# Patient Record
Sex: Male | Born: 1945 | Race: White | Hispanic: No | Marital: Married | Smoking: Current some day smoker
Health system: Southern US, Community
[De-identification: ages and names within clinical notes are randomized; demographics above are authoritative.]

## PROBLEM LIST (undated history)

## (undated) DIAGNOSIS — R310 Gross hematuria: Secondary | ICD-10-CM

## (undated) DIAGNOSIS — G4700 Insomnia, unspecified: Secondary | ICD-10-CM

## (undated) DIAGNOSIS — Z973 Presence of spectacles and contact lenses: Secondary | ICD-10-CM

## (undated) DIAGNOSIS — F909 Attention-deficit hyperactivity disorder, unspecified type: Secondary | ICD-10-CM

## (undated) DIAGNOSIS — M48062 Spinal stenosis, lumbar region with neurogenic claudication: Secondary | ICD-10-CM

## (undated) DIAGNOSIS — N3281 Overactive bladder: Secondary | ICD-10-CM

## (undated) DIAGNOSIS — R030 Elevated blood-pressure reading, without diagnosis of hypertension: Secondary | ICD-10-CM

## (undated) DIAGNOSIS — H34231 Retinal artery branch occlusion, right eye: Secondary | ICD-10-CM

## (undated) DIAGNOSIS — I350 Nonrheumatic aortic (valve) stenosis: Secondary | ICD-10-CM

## (undated) DIAGNOSIS — Z86010 Personal history of colonic polyps: Secondary | ICD-10-CM

## (undated) DIAGNOSIS — G450 Vertebro-basilar artery syndrome: Secondary | ICD-10-CM

## (undated) DIAGNOSIS — R7301 Impaired fasting glucose: Secondary | ICD-10-CM

## (undated) DIAGNOSIS — M159 Polyosteoarthritis, unspecified: Secondary | ICD-10-CM

## (undated) DIAGNOSIS — Z87898 Personal history of other specified conditions: Secondary | ICD-10-CM

## (undated) DIAGNOSIS — N529 Male erectile dysfunction, unspecified: Secondary | ICD-10-CM

## (undated) DIAGNOSIS — I10 Essential (primary) hypertension: Secondary | ICD-10-CM

## (undated) DIAGNOSIS — F32A Depression, unspecified: Secondary | ICD-10-CM

## (undated) DIAGNOSIS — C61 Malignant neoplasm of prostate: Secondary | ICD-10-CM

## (undated) DIAGNOSIS — Z9189 Other specified personal risk factors, not elsewhere classified: Secondary | ICD-10-CM

## (undated) DIAGNOSIS — R011 Cardiac murmur, unspecified: Secondary | ICD-10-CM

## (undated) DIAGNOSIS — E785 Hyperlipidemia, unspecified: Secondary | ICD-10-CM

## (undated) DIAGNOSIS — H8110 Benign paroxysmal vertigo, unspecified ear: Secondary | ICD-10-CM

## (undated) DIAGNOSIS — IMO0001 Reserved for inherently not codable concepts without codable children: Secondary | ICD-10-CM

## (undated) DIAGNOSIS — F419 Anxiety disorder, unspecified: Secondary | ICD-10-CM

## (undated) DIAGNOSIS — D696 Thrombocytopenia, unspecified: Secondary | ICD-10-CM

## (undated) DIAGNOSIS — M961 Postlaminectomy syndrome, not elsewhere classified: Secondary | ICD-10-CM

## (undated) DIAGNOSIS — R05 Cough: Secondary | ICD-10-CM

## (undated) DIAGNOSIS — R399 Unspecified symptoms and signs involving the genitourinary system: Secondary | ICD-10-CM

## (undated) DIAGNOSIS — F329 Major depressive disorder, single episode, unspecified: Secondary | ICD-10-CM

## (undated) DIAGNOSIS — Z87820 Personal history of traumatic brain injury: Secondary | ICD-10-CM

## (undated) DIAGNOSIS — N200 Calculus of kidney: Secondary | ICD-10-CM

## (undated) DIAGNOSIS — G8929 Other chronic pain: Secondary | ICD-10-CM

## (undated) DIAGNOSIS — E878 Other disorders of electrolyte and fluid balance, not elsewhere classified: Secondary | ICD-10-CM

## (undated) DIAGNOSIS — G2581 Restless legs syndrome: Secondary | ICD-10-CM

## (undated) HISTORY — DX: Postlaminectomy syndrome, not elsewhere classified: M96.1

## (undated) HISTORY — DX: Nonrheumatic aortic (valve) stenosis: I35.0

## (undated) HISTORY — DX: Reserved for inherently not codable concepts without codable children: IMO0001

## (undated) HISTORY — DX: Malignant neoplasm of prostate: C61

## (undated) HISTORY — DX: Impaired fasting glucose: R73.01

## (undated) HISTORY — DX: Gross hematuria: R31.0

## (undated) HISTORY — DX: Other chronic pain: G89.29

## (undated) HISTORY — DX: Hyperlipidemia, unspecified: E78.5

## (undated) HISTORY — DX: Other disorders of electrolyte and fluid balance, not elsewhere classified: E87.8

## (undated) HISTORY — DX: Male erectile dysfunction, unspecified: N52.9

## (undated) HISTORY — DX: Calculus of kidney: N20.0

## (undated) HISTORY — DX: Spinal stenosis, lumbar region with neurogenic claudication: M48.062

## (undated) HISTORY — PX: LUMBAR FUSION: SHX111

## (undated) HISTORY — DX: Restless legs syndrome: G25.81

## (undated) HISTORY — DX: Retinal artery branch occlusion, right eye: H34.231

## (undated) HISTORY — DX: Elevated blood-pressure reading, without diagnosis of hypertension: R03.0

## (undated) HISTORY — DX: Major depressive disorder, single episode, unspecified: F32.9

## (undated) HISTORY — DX: Personal history of colonic polyps: Z86.010

## (undated) HISTORY — PX: CERVICAL FUSION: SHX112

## (undated) HISTORY — DX: Benign paroxysmal vertigo, unspecified ear: H81.10

## (undated) HISTORY — DX: Overactive bladder: N32.81

## (undated) HISTORY — DX: Personal history of other specified conditions: Z87.898

## (undated) HISTORY — DX: Anxiety disorder, unspecified: F41.9

## (undated) HISTORY — PX: COLONOSCOPY: SHX174

## (undated) HISTORY — PX: SPINAL CORD STIMULATOR IMPLANT: SHX2422

## (undated) HISTORY — DX: Depression, unspecified: F32.A

## (undated) HISTORY — DX: Attention-deficit hyperactivity disorder, unspecified type: F90.9

## (undated) HISTORY — DX: Insomnia, unspecified: G47.00

## (undated) HISTORY — DX: Thrombocytopenia, unspecified: D69.6

## (undated) HISTORY — DX: Cough: R05

## (undated) HISTORY — DX: Essential (primary) hypertension: I10

## (undated) HISTORY — DX: Polyosteoarthritis, unspecified: M15.9

---

## 2000-06-15 ENCOUNTER — Encounter: Payer: Self-pay | Admitting: Emergency Medicine

## 2000-06-15 ENCOUNTER — Emergency Department (HOSPITAL_COMMUNITY): Admission: EM | Admit: 2000-06-15 | Discharge: 2000-06-15 | Payer: Self-pay | Admitting: *Deleted

## 2000-06-18 ENCOUNTER — Encounter: Admission: RE | Admit: 2000-06-18 | Discharge: 2000-06-18 | Payer: Self-pay | Admitting: Urology

## 2000-06-18 ENCOUNTER — Encounter: Payer: Self-pay | Admitting: Urology

## 2005-12-31 ENCOUNTER — Ambulatory Visit (HOSPITAL_BASED_OUTPATIENT_CLINIC_OR_DEPARTMENT_OTHER): Admission: RE | Admit: 2005-12-31 | Discharge: 2005-12-31 | Payer: Self-pay | Admitting: General Surgery

## 2005-12-31 ENCOUNTER — Encounter (INDEPENDENT_AMBULATORY_CARE_PROVIDER_SITE_OTHER): Payer: Self-pay | Admitting: Specialist

## 2005-12-31 HISTORY — PX: OTHER SURGICAL HISTORY: SHX169

## 2007-06-12 ENCOUNTER — Encounter: Admission: RE | Admit: 2007-06-12 | Discharge: 2007-06-12 | Payer: Self-pay | Admitting: Anesthesiology

## 2007-07-23 ENCOUNTER — Encounter: Admission: RE | Admit: 2007-07-23 | Discharge: 2007-07-23 | Payer: Self-pay | Admitting: Anesthesiology

## 2009-08-01 ENCOUNTER — Encounter: Admission: RE | Admit: 2009-08-01 | Discharge: 2009-08-01 | Payer: Self-pay | Admitting: Family Medicine

## 2010-08-01 NOTE — Op Note (Signed)
NAMEBYRNE, CAPEK                 ACCOUNT NO.:  [id number]   MEDICAL RECORD NO.:  [id number]          PATIENT TYPE:  AMB   LOCATION:  DSC                          FACILITY:  [facility]   PHYSICIAN:  Angelia Mould. Derrell Lolling, M.D.DATE OF BIRTH:  24-Jul-1945   DATE OF PROCEDURE:  12/31/2005  DATE OF DISCHARGE:                                 OPERATIVE REPORT   PREOPERATIVE DIAGNOSIS:  Muscle pain, myositis   POSTOPERATIVE DIAGNOSIS:  Muscle pain, myositis   OPERATION PERFORMED:  Left thigh quadriceps muscle biopsy x3.   SURGEON:  Angelia Mould. Derrell Lolling, M.D.   OPERATIVE INDICATIONS:  This is a 65 year old white man who has developed  progressive disabling muscle pain.  He has pain in his arms and his legs.  This was thought to be fibromyalgia, but his blood work has shown  substantially progressive elevations of his CPK.  Dr. Chase Picket as  evaluated him and thinks a muscle biopsy is the next step in this  evaluation.  He has been counseled as an outpatient.  He is brought to the  operating room electively.   OPERATIVE TECHNIQUE:  Following the induction of a general LMA anesthetic,  the patient's left thigh was prepped and draped in a sterile fashion.  The  patient was identified as to correct procedure, correct site, correct  patient.  0.5% Marcaine with epinephrine was used as a local infiltration  anesthetic.  A slightly oblique longitudinally oriented incision was made in  the left thigh anterolaterally.  Dissection was carried down through the  subcutaneous tissue.  The deep muscle fascia was incised.  Self-retaining  retractors were placed.  I isolated three separate slips of left quadriceps  muscle.  Each of these was about to 2.5 cm in length x about 5-10 mm in  diameter.  They were clamped proximally and distally after isolation,  removed and then fixed to a tongue blade was skin staples.  These were  wrapped in saline.  The cut ends of the muscle were tied off with 2-0 Vicryl  ties.  I  sent three separate slips of muscle.  Hemostasis was excellent.  The wound was irrigated with saline.  The deep muscle fascia was closed with  a running suture of 2-0 Vicryl.  Subcutaneous tissue was closed with  interrupted sutures of 3-0 Vicryl.  Skin closed with a running subcuticular  suture of 4-0 Monocryl and Steri-Strips.  Clean bandages were placed and the  patient taken to the recovery room in stable condition.  Estimated blood  loss was about 15 mL.  Complications none.  Sponge, needle and instrument  counts were correct.      Angelia Mould. Derrell Lolling, M.D.  Electronically Signed     HMI/MEDQ  D:  12/31/2005  T:  01/01/2006  Job:  [id number]   cc:   Chase Picket, Dr.  Marjory Lies, M.D.

## 2011-03-17 HISTORY — PX: EYE SURGERY: SHX253

## 2013-06-13 ENCOUNTER — Emergency Department (HOSPITAL_BASED_OUTPATIENT_CLINIC_OR_DEPARTMENT_OTHER): Payer: BC Managed Care – PPO

## 2013-06-13 ENCOUNTER — Encounter (HOSPITAL_BASED_OUTPATIENT_CLINIC_OR_DEPARTMENT_OTHER): Payer: Self-pay | Admitting: Emergency Medicine

## 2013-06-13 ENCOUNTER — Emergency Department (HOSPITAL_BASED_OUTPATIENT_CLINIC_OR_DEPARTMENT_OTHER)
Admission: EM | Admit: 2013-06-13 | Discharge: 2013-06-13 | Disposition: A | Discharge status: Home / Self Care | Payer: BC Managed Care – PPO | Attending: Emergency Medicine | Admitting: Emergency Medicine

## 2013-06-13 DIAGNOSIS — Y9389 Activity, other specified: Secondary | ICD-10-CM | POA: Insufficient documentation

## 2013-06-13 DIAGNOSIS — S0990XA Unspecified injury of head, initial encounter: Secondary | ICD-10-CM | POA: Insufficient documentation

## 2013-06-13 DIAGNOSIS — Z8739 Personal history of other diseases of the musculoskeletal system and connective tissue: Secondary | ICD-10-CM | POA: Insufficient documentation

## 2013-06-13 DIAGNOSIS — Y929 Unspecified place or not applicable: Secondary | ICD-10-CM | POA: Insufficient documentation

## 2013-06-13 DIAGNOSIS — S0993XA Unspecified injury of face, initial encounter: Secondary | ICD-10-CM | POA: Insufficient documentation

## 2013-06-13 DIAGNOSIS — W1809XA Striking against other object with subsequent fall, initial encounter: Secondary | ICD-10-CM | POA: Insufficient documentation

## 2013-06-13 DIAGNOSIS — S199XXA Unspecified injury of neck, initial encounter: Secondary | ICD-10-CM

## 2013-06-13 DIAGNOSIS — R55 Syncope and collapse: Secondary | ICD-10-CM | POA: Insufficient documentation

## 2013-06-13 DIAGNOSIS — S3660XA Unspecified injury of rectum, initial encounter: Secondary | ICD-10-CM | POA: Insufficient documentation

## 2013-06-13 DIAGNOSIS — R110 Nausea: Secondary | ICD-10-CM | POA: Insufficient documentation

## 2013-06-13 DIAGNOSIS — G8929 Other chronic pain: Secondary | ICD-10-CM | POA: Insufficient documentation

## 2013-06-13 DIAGNOSIS — F17200 Nicotine dependence, unspecified, uncomplicated: Secondary | ICD-10-CM | POA: Insufficient documentation

## 2013-06-13 DIAGNOSIS — Z880 Allergy status to penicillin: Secondary | ICD-10-CM | POA: Insufficient documentation

## 2013-06-13 DIAGNOSIS — IMO0002 Reserved for concepts with insufficient information to code with codable children: Secondary | ICD-10-CM | POA: Insufficient documentation

## 2013-06-13 LAB — CBC WITH DIFFERENTIAL/PLATELET
Basophils Absolute: 0 K/uL (ref 0.0–0.1)
Basophils Relative: 0 % (ref 0–1)
Eosinophils Absolute: 0 K/uL (ref 0.0–0.7)
Eosinophils Relative: 1 % (ref 0–5)
HCT: 47.2 % (ref 39.0–52.0)
Hemoglobin: 16.2 g/dL (ref 13.0–17.0)
Lymphocytes Relative: 19 % (ref 12–46)
Lymphs Abs: 1.1 K/uL (ref 0.7–4.0)
MCH: 30.6 pg (ref 26.0–34.0)
MCHC: 34.3 g/dL (ref 30.0–36.0)
MCV: 89.1 fL (ref 78.0–100.0)
Monocytes Absolute: 0.7 K/uL (ref 0.1–1.0)
Monocytes Relative: 12 % (ref 3–12)
Neutro Abs: 3.7 K/uL (ref 1.7–7.7)
Neutrophils Relative %: 68 % (ref 43–77)
Platelets: 138 K/uL — ABNORMAL LOW (ref 150–400)
RBC: 5.3 MIL/uL (ref 4.22–5.81)
RDW: 13 % (ref 11.5–15.5)
WBC: 5.5 K/uL (ref 4.0–10.5)

## 2013-06-13 LAB — BASIC METABOLIC PANEL
BUN: 17 mg/dL (ref 6–23)
CO2: 26 mEq/L (ref 19–32)
Calcium: 9.6 mg/dL (ref 8.4–10.5)
Chloride: 103 mEq/L (ref 96–112)
Creatinine, Ser: 0.9 mg/dL (ref 0.50–1.35)
GFR calc Af Amer: >90 mL/min (ref >90)
GFR calc non Af Amer: 85 mL/min — ABNORMAL LOW (ref >90)
Glucose, Bld: 113 mg/dL — ABNORMAL HIGH (ref 70–99)
Potassium: 4.2 mEq/L (ref 3.7–5.3)
Sodium: 143 mEq/L (ref 137–147)

## 2013-06-13 LAB — URINALYSIS, ROUTINE W REFLEX MICROSCOPIC
Glucose, UA: NEGATIVE mg/dL
Hgb urine dipstick: NEGATIVE
Ketones, ur: 15 mg/dL — AB
Leukocytes, UA: NEGATIVE
Nitrite: NEGATIVE
Protein, ur: NEGATIVE mg/dL
Specific Gravity, Urine: 1.029 (ref 1.005–1.030)
Urobilinogen, UA: 1 mg/dL (ref 0.0–1.0)
pH: 6 (ref 5.0–8.0)

## 2013-06-13 LAB — TROPONIN I: Troponin I: <0.3 ng/mL

## 2013-06-13 LAB — CBG MONITORING, ED: Glucose-Capillary: 114 mg/dL — ABNORMAL HIGH (ref 70–99)

## 2013-06-13 NOTE — ED Provider Notes (Signed)
CSN: [id number]     Arrival date & time 06/13/13  1106 History   First MD Initiated Contact with Patient 06/13/13 1110     Chief Complaint  Patient presents with  . Loss of Consciousness  . Head Injury   HPI MEARL OLVER is 68 y.o. male that experienced a syncopal episode at approximately 3 AM this morning. He reports he experienced a sharp back pain this morning that went from his lower back into his rectum, there was a 10 out of 10 pain and lasted only a few seconds to a minute. After this pain he got up to go into the bathroom, the bathroom started spinning, he lost consciousness recalls, hitting his head on a table. The commode was also broken within the fall, but he does not believe his head hit the commode. He states he immediately felt nauseated, sweating and weak when he regained consciousness. No bowel or bladder incontinence. No post ictal phase. He states he laid there for about 15 minutes before he was able to call for his wife and get back towards the bed. He does admit to mild shortness of breath for 10-15 minutes upon awakening. He sustained a superficial laceration over his left eye. He admits to a headache last night and this morning with some pain behind bilateral eyes. He denies current headache. He has not had appetite eat anything this morning. She does admit to feeling "woozy". He uses smokeless tobacco, no alcohol. He does use Celebrex daily for arthritis. He does have chronic neck and back pain with surgeries to each location. He has no cardiac history.   Past Medical History  Diagnosis Date  . Chronic back pain   . Arthritis    Past Surgical History  Procedure Laterality Date  . Back surgery     No family history on file. History  Substance Use Topics  . Smoking status: Current Every Day Smoker  . Smokeless tobacco: Current User    Types: Chew  . Alcohol Use: No    Review of Systems  Constitutional: Positive for diaphoresis, appetite change and fatigue. Negative  for fever, chills, activity change and unexpected weight change.  HENT: Negative for congestion, ear pain, facial swelling, nosebleeds, rhinorrhea, tinnitus and trouble swallowing.   Eyes: Negative for photophobia, pain, redness and visual disturbance.  Respiratory: Positive for shortness of breath. Negative for cough, chest tightness and wheezing.   Cardiovascular: Negative for chest pain, palpitations and leg swelling.  Gastrointestinal: Positive for nausea and rectal pain. Negative for vomiting, abdominal pain, diarrhea, constipation, blood in stool, abdominal distention and anal bleeding.  Genitourinary: Negative for dysuria, frequency, hematuria, flank pain and difficulty urinating.  Musculoskeletal: Positive for arthralgias, back pain and neck pain. Negative for gait problem, joint swelling, myalgias and neck stiffness.  Skin: Positive for wound.  Neurological: Positive for dizziness, syncope, weakness and headaches. Negative for tremors, seizures, facial asymmetry, speech difficulty, light-headedness and numbness.  Hematological: Negative for adenopathy.  Psychiatric/Behavioral: Negative for confusion, decreased concentration and agitation. The patient is not nervous/anxious.     Allergies  Penicillins  Home Medications   Current Outpatient Rx  Name  Route  Sig  Dispense  Refill  . Amphetamine-Dextroamphetamine (ADDERALL PO)   Oral   Take by mouth.         . Celecoxib (CELEBREX PO)   Oral   Take by mouth.         Marland Kitchen ibuprofen (ADVIL,MOTRIN) 200 MG tablet   Oral   Take  200 mg by mouth every 6 (six) hours as needed.          BP 165/104  Pulse 75  Temp(Src) 97.7 F (36.5 C) (Oral)  Resp 13  Ht 6\' 4"  (1.93 m)  Wt 230 lb (104.327 kg)  BMI 28.01 kg/m2  SpO2 98% Physical Exam Gen: NAD. Nontoxic in appearance. Alert. Oriented. HEENT: Superficial laceration above left eyebrow. [place]. Bilateral TM visualized and normal in appearance. Bilateral eyes without injections or  icterus. MMM. Bilateral nares without trauma, drainage, edema or swelling. Throat without erythema or exudates. No mouth or dental injury CV: RRR, no murmurs, clicks gallops or rubs Chest: CTAB, no wheeze or crackles Abd: Soft. NTND. BS present. No Masses palpated.  Ext: No erythema. No edema.  Skin: No rashes, purpura or petechiae.  Neuro: Normal gait. PERLA. EOMi. Alert. Cranial nerve 2 through 12 intact. No focal deficits. MSK: 5 out of 5 bilateral upper and lower extremity muscle strength. No bony tenderness over cervical thoracic and lumbar regions including over sites of hardware. Bony prominence right medial clavicle.  Psych: Normal affect, dressed, demeanor and speech.  ED Course  Procedures (including critical care time) Labs Review Labs Reviewed  CBC WITH DIFFERENTIAL - Abnormal; Notable for the following:    Platelets 138 (*)    All other components within normal limits  BASIC METABOLIC PANEL - Abnormal; Notable for the following:    Glucose, Bld 113 (*)    GFR calc non Af Amer 85 (*)    All other components within normal limits  URINALYSIS, ROUTINE W REFLEX MICROSCOPIC - Abnormal; Notable for the following:    Color, Urine AMBER (*)    Bilirubin Urine SMALL (*)    Ketones, ur 15 (*)    All other components within normal limits  CBG MONITORING, ED - Abnormal; Notable for the following:    Glucose-Capillary 114 (*)    All other components within normal limits  TROPONIN I   Imaging Review Dg Clavicle Right  06/13/2013   CLINICAL DATA:  Bony prominence of the clavicle. The patient had a recent fall.  EXAM: RIGHT CLAVICLE - 2+ VIEWS  COMPARISON:  None.  FINDINGS: There is no fracture. 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 No visible fracture of the medial aspect of the right clavicle.  IMPRESSION: No acute abnormality of the right clavicle.   Electronically Signed   By: Rozetta Nunnery M.D.   On: 06/13/2013 14:20   Ct Head Wo Contrast  06/13/2013    CLINICAL DATA:  Syncope leading to head trauma today.  EXAM: CT HEAD WITHOUT CONTRAST  CT CERVICAL SPINE WITHOUT CONTRAST  TECHNIQUE: Multidetector CT imaging of the head and cervical spine was performed following the standard protocol without intravenous contrast. Multiplanar CT image reconstructions of the cervical spine were also generated.  COMPARISON:  None.  MRI of the brain dated 08/01/2009  FINDINGS: CT HEAD FINDINGS  No mass lesion. No midline shift. No acute hemorrhage or hematoma. No extra-axial fluid collections. No evidence of acute infarction. Brain parenchyma is normal. Osseous structures are normal. 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 Minimal soft tissue swelling over the left superior orbital rim.  CT CERVICAL SPINE FINDINGS  There is no fracture or subluxation or prevertebral soft tissue swelling. The patient has had prior anterior fusions at C5-6 and C6-7. There is severe facet arthritis at C2-3 through C4-5 on the left and to a lesser degree on the right. There  is severe left foraminal stenosis at C3-4.  IMPRESSION: 1. No intracranial abnormality. Minimal soft tissue swelling over the left superior orbital rim. 2. No acute abnormality of the cervical spine.   Electronically Signed   By: Rozetta Nunnery M.D.   On: 06/13/2013 12:47   Ct Cervical Spine Wo Contrast  06/13/2013   CLINICAL DATA:  Syncope leading to head trauma today.  EXAM: CT HEAD WITHOUT CONTRAST  CT CERVICAL SPINE WITHOUT CONTRAST  TECHNIQUE: Multidetector CT imaging of the head and cervical spine was performed following the standard protocol without intravenous contrast. Multiplanar CT image reconstructions of the cervical spine were also generated.  COMPARISON:  None.  MRI of the brain dated 08/01/2009  FINDINGS: CT HEAD FINDINGS  No mass lesion. No midline shift. No acute hemorrhage or hematoma. No extra-axial fluid collections. No evidence of acute infarction. Brain parenchyma is normal.  Osseous structures are normal. 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 Minimal soft tissue swelling over the left superior orbital rim.  CT CERVICAL SPINE FINDINGS  There is no fracture or subluxation or prevertebral soft tissue swelling. The patient has had prior anterior fusions at C5-6 and C6-7. There is severe facet arthritis at C2-3 through C4-5 on the left and to a lesser degree on the right. 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  IMPRESSION: 1. No intracranial abnormality. Minimal soft tissue swelling over the left superior orbital rim. 2. No acute abnormality of the cervical spine.   Electronically Signed   By: Rozetta Nunnery M.D.   On: 06/13/2013 12:47     EKG Interpretation   Date/Time:  Tuesday June 13 2013 11:26:47 EDT Ventricular Rate:  84 PR Interval:  164 QRS Duration: 96 QT Interval:  412 QTC Calculation: 486 R Axis:   31 Text Interpretation:  Normal sinus rhythm Prolonged QT  - QTc of 486 no  other significant change Confirmed by HARRISON  MD, [place] ([id number]) on  06/13/2013 11:48:51 AM      MDM   Final diagnoses:  Syncope   Patient presented to the ED approximately 8 hours after syncopal event. Patient has no known cardiac history. Patient superficial laceration above left eyebrow not needing closure. Patient in no acute distress, nontoxic and  vitals remained stable. Patient had normal oral examination. He does take Celebrex daily. CT of head showed no intracranial abnormalities and no acute abnormality of the cervical spine. X-ray of right clavicle with moderate osteoarthritis, otherwise normal. First EKG was QTC of 486 repeat EKG with normal QTc. Troponin, CBC and BMP are within normal limits. Patient desired discharge. Red flags discussed with patient. Uncertain etiology of syncope, possibly vasovagal response to pain. Patient has appointment with PCP within 1-2 days.      Ma Hillock, DO 06/13/13 1450

## 2013-06-13 NOTE — Discharge Instructions (Signed)
Syncope  Syncope is a fainting spell. This means the person loses consciousness and drops to the ground. The person is generally unconscious for less than 5 minutes. The person may have some muscle twitches for up to 15 seconds before waking up and returning to normal. Syncope occurs more often in elderly people, but it can happen to anyone. While most causes of syncope are not dangerous, syncope can be a sign of a serious medical problem. It is important to seek medical care.   CAUSES   Syncope is caused by a sudden decrease in blood flow to the brain. The specific cause is often not determined. Factors that can trigger syncope include:   Taking medicines that lower blood pressure.   Sudden changes in posture, such as standing up suddenly.   Taking more medicine than prescribed.   Standing in one place for too long.   Seizure disorders.   Dehydration and excessive exposure to heat.   Low blood sugar (hypoglycemia).   Straining to have a bowel movement.   Heart disease, irregular heartbeat, or other circulatory problems.   Fear, emotional distress, seeing blood, or severe pain.  SYMPTOMS   Right before fainting, you may:   Feel dizzy or lightheaded.   Feel nauseous.   See all white or all black in your field of vision.   Have cold, clammy skin.  DIAGNOSIS   Your caregiver will ask about your symptoms, perform a physical exam, and perform electrocardiography (ECG) to record the electrical activity of your heart. Your caregiver may also perform other heart or blood tests to determine the cause of your syncope.  TREATMENT   In most cases, no treatment is needed. Depending on the cause of your syncope, your caregiver may recommend changing or stopping some of your medicines.  HOME CARE INSTRUCTIONS   Have someone stay with you until you feel stable.   Do not drive, operate machinery, or play sports until your caregiver says it is okay.   Keep all follow-up appointments as directed by your  caregiver.   Lie down right away if you start feeling like you might faint. Breathe deeply and steadily. Wait until all the symptoms have passed.   Drink enough fluids to keep your urine clear or pale yellow.   If you are taking blood pressure or heart medicine, get up slowly, taking several minutes to sit and then stand. This can reduce dizziness.  SEEK IMMEDIATE MEDICAL CARE IF:    You have a severe headache.   You have unusual pain in the chest, abdomen, or back.   You are bleeding from the mouth or rectum, or you have black or tarry stool.   You have an irregular or very fast heartbeat.   You have pain with breathing.   You have repeated fainting or seizure-like jerking during an episode.   You faint when sitting or lying down.   You have confusion.   You have difficulty walking.   You have severe weakness.   You have vision problems.  If you fainted, call your local emergency services (911 in [place] Do not drive yourself to the hospital.   MAKE SURE YOU:   Understand these instructions.   Will watch your condition.   Will get help right away if you are not doing well or get worse.  Document Released: 03/02/2005 Document Revised: 09/01/2011 Document Reviewed: 05/01/2011  [employer] Patient Information 2014 [employer]

## 2013-06-13 NOTE — ED Notes (Signed)
He passed out last night and hit his head. Abrasion over his left eyebrow. He is alert on arrival. Ambulatory to tx room.

## 2013-06-14 NOTE — ED Provider Notes (Signed)
Medical screening examination/treatment/procedure(s) were conducted as a shared visit with resident physician and myself.  I personally evaluated the patient during the encounter.   I interviewed and examined the patient. Lungs are CTAB. Cardiac exam wnl. Abdomen soft. Labs/imaging non-contrib. Initial ecg w/ mildly prolonged QTc but repeat normal. Pt asx here. He describes a prodrome of dizziness/lightheadedness w/ subsequent fall after getting up in the night d/t his chronic back pain. No hx of AAA. He is asx here w/ a normal exam. I discussed admission for observation w/ him, but he insists that he would prefer to go home. 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 He also has no significant cardiac history. He states that he will f/u w/ his pcp tomorrow and return for any further sx (cp, sob, further syncope, dizziness, lightheadedness, near syncopal episodes, worsening pain).   Blanchard Kelch, MD 06/14/13 1120

## 2013-06-23 ENCOUNTER — Encounter: Payer: Self-pay | Admitting: Neurology

## 2013-06-23 ENCOUNTER — Encounter: Payer: Self-pay | Admitting: *Deleted

## 2013-06-27 ENCOUNTER — Telehealth: Payer: Self-pay | Admitting: Neurology

## 2013-06-27 ENCOUNTER — Institutional Professional Consult (permissible substitution): Payer: BC Managed Care – PPO | Admitting: Neurology

## 2013-06-27 NOTE — Telephone Encounter (Signed)
This patient no showed for a new patient consult appointment today.

## 2014-01-31 ENCOUNTER — Encounter: Payer: Self-pay | Admitting: Neurology

## 2014-02-06 ENCOUNTER — Encounter: Payer: Self-pay | Admitting: Neurology

## 2014-03-28 ENCOUNTER — Emergency Department (HOSPITAL_COMMUNITY): Payer: Managed Care, Other (non HMO)

## 2014-03-28 ENCOUNTER — Encounter (HOSPITAL_COMMUNITY): Payer: Self-pay | Admitting: *Deleted

## 2014-03-28 ENCOUNTER — Observation Stay (HOSPITAL_COMMUNITY)
Admission: EM | Admit: 2014-03-28 | Discharge: 2014-03-31 | Disposition: A | Discharge status: Home / Self Care | Payer: Managed Care, Other (non HMO) | Attending: Internal Medicine | Admitting: Internal Medicine

## 2014-03-28 DIAGNOSIS — M545 Low back pain, unspecified: Secondary | ICD-10-CM

## 2014-03-28 DIAGNOSIS — W19XXXA Unspecified fall, initial encounter: Secondary | ICD-10-CM

## 2014-03-28 DIAGNOSIS — I10 Essential (primary) hypertension: Secondary | ICD-10-CM | POA: Diagnosis present

## 2014-03-28 DIAGNOSIS — I4581 Long QT syndrome: Secondary | ICD-10-CM

## 2014-03-28 DIAGNOSIS — M791 Myalgia: Secondary | ICD-10-CM | POA: Insufficient documentation

## 2014-03-28 DIAGNOSIS — G4700 Insomnia, unspecified: Secondary | ICD-10-CM | POA: Diagnosis not present

## 2014-03-28 DIAGNOSIS — G8929 Other chronic pain: Secondary | ICD-10-CM | POA: Diagnosis not present

## 2014-03-28 DIAGNOSIS — Z8659 Personal history of other mental and behavioral disorders: Secondary | ICD-10-CM | POA: Diagnosis not present

## 2014-03-28 DIAGNOSIS — Z880 Allergy status to penicillin: Secondary | ICD-10-CM | POA: Insufficient documentation

## 2014-03-28 DIAGNOSIS — I951 Orthostatic hypotension: Secondary | ICD-10-CM | POA: Diagnosis not present

## 2014-03-28 DIAGNOSIS — R29898 Other symptoms and signs involving the musculoskeletal system: Secondary | ICD-10-CM

## 2014-03-28 DIAGNOSIS — D696 Thrombocytopenia, unspecified: Secondary | ICD-10-CM | POA: Diagnosis not present

## 2014-03-28 DIAGNOSIS — R9431 Abnormal electrocardiogram [ECG] [EKG]: Secondary | ICD-10-CM

## 2014-03-28 DIAGNOSIS — R4020 Unspecified coma: Secondary | ICD-10-CM

## 2014-03-28 DIAGNOSIS — R55 Syncope and collapse: Secondary | ICD-10-CM | POA: Diagnosis present

## 2014-03-28 LAB — CBC
HCT: 46.4 % (ref 39.0–52.0)
HCT: 45.1 % (ref 39.0–52.0)
Hemoglobin: 15.3 g/dL (ref 13.0–17.0)
Hemoglobin: 14.5 g/dL (ref 13.0–17.0)
MCH: 30.1 pg (ref 26.0–34.0)
MCH: 29.7 pg (ref 26.0–34.0)
MCHC: 33 g/dL (ref 30.0–36.0)
MCHC: 32.2 g/dL (ref 30.0–36.0)
MCV: 91.2 fL (ref 78.0–100.0)
MCV: 92.2 fL (ref 78.0–100.0)
Platelets: 141 10*3/uL — ABNORMAL LOW (ref 150–400)
Platelets: 118 10*3/uL — ABNORMAL LOW (ref 150–400)
RBC: 5.09 MIL/uL (ref 4.22–5.81)
RBC: 4.89 MIL/uL (ref 4.22–5.81)
RDW: 13.3 % (ref 11.5–15.5)
RDW: 13.6 % (ref 11.5–15.5)
WBC: 5 10*3/uL (ref 4.0–10.5)
WBC: 5.4 10*3/uL (ref 4.0–10.5)

## 2014-03-28 LAB — URINALYSIS, ROUTINE W REFLEX MICROSCOPIC
Bilirubin Urine: NEGATIVE
Glucose, UA: NEGATIVE mg/dL
Hgb urine dipstick: NEGATIVE
Ketones, ur: NEGATIVE mg/dL
Leukocytes, UA: NEGATIVE
Nitrite: NEGATIVE
Protein, ur: NEGATIVE mg/dL
Specific Gravity, Urine: 1.03 (ref 1.005–1.030)
Urobilinogen, UA: 1 mg/dL (ref 0.0–1.0)
pH: 5 (ref 5.0–8.0)

## 2014-03-28 LAB — BASIC METABOLIC PANEL
Anion gap: 7 (ref 5–15)
BUN: 26 mg/dL — ABNORMAL HIGH (ref 6–23)
CO2: 21 mmol/L (ref 19–32)
Calcium: 8.5 mg/dL (ref 8.4–10.5)
Chloride: 110 mEq/L (ref 96–112)
Creatinine, Ser: 0.95 mg/dL (ref 0.50–1.35)
GFR calc Af Amer: >90 mL/min (ref >90)
GFR calc non Af Amer: 83 mL/min — ABNORMAL LOW (ref >90)
Glucose, Bld: 122 mg/dL — ABNORMAL HIGH (ref 70–99)
Potassium: 4 mmol/L (ref 3.5–5.1)
Sodium: 138 mmol/L (ref 135–145)

## 2014-03-28 LAB — HEPATIC FUNCTION PANEL
ALT: 21 U/L (ref 0–53)
AST: 27 U/L (ref 0–37)
Albumin: 3.9 g/dL (ref 3.5–5.2)
Alkaline Phosphatase: 49 U/L (ref 39–117)
Bilirubin, Direct: 0.3 mg/dL (ref 0.0–0.3)
Indirect Bilirubin: 1.3 mg/dL — ABNORMAL HIGH (ref 0.3–0.9)
Total Bilirubin: 1.6 mg/dL — ABNORMAL HIGH (ref 0.3–1.2)
Total Protein: 6.3 g/dL (ref 6.0–8.3)

## 2014-03-28 LAB — TROPONIN I
Troponin I: <0.03 ng/mL (ref <0.031)
Troponin I: <0.03 ng/mL (ref <0.031)

## 2014-03-28 LAB — CBG MONITORING, ED: Glucose-Capillary: 116 mg/dL — ABNORMAL HIGH (ref 70–99)

## 2014-03-28 LAB — CREATININE, SERUM
Creatinine, Ser: 0.96 mg/dL (ref 0.50–1.35)
GFR calc Af Amer: >90 mL/min (ref >90)
GFR calc non Af Amer: 83 mL/min — ABNORMAL LOW (ref >90)

## 2014-03-28 MED ORDER — METHOCARBAMOL 500 MG PO TABS
500.0000 mg | ORAL_TABLET | Freq: Three times a day (TID) | ORAL | Status: DC | PRN
  Administered 2014-03-28 – 2014-03-31 (×5): 500 mg via ORAL
  Filled 2014-03-28 (×5): qty 1

## 2014-03-28 MED ORDER — HYDROMORPHONE HCL 1 MG/ML IJ SOLN
1.0000 mg | INTRAMUSCULAR | Status: AC | PRN
  Administered 2014-03-28 (×2): 1 mg via INTRAVENOUS
  Filled 2014-03-28 (×2): qty 1

## 2014-03-28 MED ORDER — KETOROLAC TROMETHAMINE 30 MG/ML IJ SOLN: 30.0000 mg | Freq: Three times a day (TID) | INTRAMUSCULAR | Status: DC | PRN

## 2014-03-28 MED ORDER — ENOXAPARIN SODIUM 30 MG/0.3ML ~~LOC~~ SOLN
30.0000 mg | SUBCUTANEOUS | Status: DC
  Administered 2014-03-28 – 2014-03-29 (×2): 30 mg via SUBCUTANEOUS
  Filled 2014-03-28 (×3): qty 0.3

## 2014-03-28 MED ORDER — AMPHETAMINE-DEXTROAMPHET ER 10 MG PO CP24
30.0000 mg | ORAL_CAPSULE | Freq: Every day | ORAL | Status: DC
  Filled 2014-03-28 (×3): qty 3

## 2014-03-28 MED ORDER — ACETAMINOPHEN 325 MG PO TABS: 650.0000 mg | ORAL_TABLET | Freq: Four times a day (QID) | ORAL | Status: DC | PRN

## 2014-03-28 MED ORDER — HYDROMORPHONE HCL 1 MG/ML IJ SOLN
1.0000 mg | INTRAMUSCULAR | Status: DC | PRN
  Administered 2014-03-28: 1 mg via INTRAVENOUS
  Filled 2014-03-28: qty 1

## 2014-03-28 MED ORDER — KETOROLAC TROMETHAMINE 15 MG/ML IJ SOLN
15.0000 mg | Freq: Three times a day (TID) | INTRAMUSCULAR | Status: DC | PRN
  Administered 2014-03-28 – 2014-03-30 (×3): 15 mg via INTRAVENOUS
  Filled 2014-03-28 (×3): qty 1

## 2014-03-28 MED ORDER — ONDANSETRON HCL 4 MG PO TABS: 4.0000 mg | ORAL_TABLET | Freq: Four times a day (QID) | ORAL | Status: DC | PRN

## 2014-03-28 MED ORDER — ACETAMINOPHEN 650 MG RE SUPP: 650.0000 mg | Freq: Four times a day (QID) | RECTAL | Status: DC | PRN

## 2014-03-28 MED ORDER — ONDANSETRON HCL 4 MG/2ML IJ SOLN
4.0000 mg | Freq: Four times a day (QID) | INTRAMUSCULAR | Status: DC | PRN
  Administered 2014-03-29 – 2014-03-30 (×3): 4 mg via INTRAVENOUS
  Filled 2014-03-28 (×4): qty 2

## 2014-03-28 MED ORDER — SODIUM CHLORIDE 0.9 % IV SOLN
INTRAVENOUS | Status: DC
  Administered 2014-03-28 – 2014-03-29 (×3): via INTRAVENOUS

## 2014-03-28 MED ORDER — HYDROMORPHONE HCL 1 MG/ML IJ SOLN
1.0000 mg | INTRAMUSCULAR | Status: DC | PRN
  Administered 2014-03-28 – 2014-03-31 (×13): 1 mg via INTRAVENOUS
  Filled 2014-03-28 (×13): qty 1

## 2014-03-28 MED ORDER — SODIUM CHLORIDE 0.9 % IV SOLN
INTRAVENOUS | Status: AC
  Administered 2014-03-28: 16:00:00 via INTRAVENOUS

## 2014-03-28 MED ORDER — PANTOPRAZOLE SODIUM 40 MG PO TBEC
40.0000 mg | DELAYED_RELEASE_TABLET | Freq: Every day | ORAL | Status: DC
  Administered 2014-03-29 – 2014-03-30 (×2): 40 mg via ORAL
  Filled 2014-03-28 (×3): qty 1

## 2014-03-28 MED ORDER — DIAZEPAM 5 MG PO TABS
5.0000 mg | ORAL_TABLET | Freq: Once | ORAL | Status: AC
  Administered 2014-03-28: 5 mg via ORAL
  Filled 2014-03-28: qty 1

## 2014-03-28 MED ORDER — FENTANYL CITRATE 0.05 MG/ML IJ SOLN
50.0000 ug | INTRAMUSCULAR | Status: DC | PRN
  Administered 2014-03-28: 50 ug via INTRAVENOUS
  Filled 2014-03-28: qty 2

## 2014-03-28 MED ORDER — SODIUM CHLORIDE 0.9 % IJ SOLN
3.0000 mL | Freq: Two times a day (BID) | INTRAMUSCULAR | Status: DC
Start: 2014-03-28 — End: 2014-03-31
  Administered 2014-03-29 – 2014-03-30 (×4): 3 mL via INTRAVENOUS

## 2014-03-28 NOTE — ED Notes (Addendum)
Per ems pt is from home, hx of syncopal episodes, denies cardiac hx. Pt reports he passed out around 0400. Pt was in kitchen getting an orange, woke up on the floor, wife assisted him back to bed. Hx chronic back pain 10/10.  18 R AC, 250 mcg fentanyl given

## 2014-03-28 NOTE — ED Notes (Signed)
Bed: RESB Expected date:  Expected time:  Means of arrival:  Comments: EMS syncope/back pain

## 2014-03-28 NOTE — ED Provider Notes (Signed)
CSN: [id number]     Arrival date & time 03/28/14  1016 History   First MD Initiated Contact with Patient 03/28/14 1045     Chief Complaint  Patient presents with  . Loss of Consciousness  . Hip Pain      HPI Pt was seen at 1050. Per EMS, pt's wife, and pt report: c/o sudden onset and resolution of one episode of syncope that occurred this morning. Pt states he has hx of syncope but "this was different." Pt states his usual syncopal episodes start with "rectal pain," "followed by low back pain" and "then I feel like I'm going to pass out." Pt states "if I lay down fast enough I don't pass out." States he was evaluated by a Neurologist at [facility] "and they called it a name I can't remember." Pt states the syncopal episode that occurred today was "different." States he was "in the kitchen and then next thing I was on the floor." Pt's wife heard pt fall and found him on the floor unresponsive. Denies apnea, no pulselessness, no incont of bowel/bladder, no seizure activity. Pt's wife states pt woke up a short time later and she was able to help him walk to bed. Pt only c/o acute flair of his chronic low back pain since the fall. Denies prodromal symptoms before the syncopal episode. Denies N/V/D, no abd pain, no CP/SOB, no AMS/confusion, no focal motor weakness, no tingling/numbness in extremities.    Past Medical History  Diagnosis Date  . Chronic back pain   . Arthritis   . Personal history of other mental disorder     ADD  . Hypertension   . Myalgia and myositis, unspecified    Past Surgical History  Procedure Laterality Date  . Back surgery    . Neck surgery     Family History  Problem Relation Age of Onset  . Depression Mother   . Diabetes Brother    History  Substance Use Topics  . Smoking status: Current Every Day Smoker  . Smokeless tobacco: Current User    Types: Chew  . Alcohol Use: No    Review of Systems ROS: Statement: All systems negative except  as marked or noted in the HPI; Constitutional: Negative for fever and chills. ; ; Eyes: Negative for eye pain, redness and discharge. ; ; ENMT: Negative for ear pain, hoarseness, nasal congestion, sinus pressure and sore throat. ; ; Cardiovascular: Negative for chest pain, palpitations, diaphoresis, dyspnea and peripheral edema. ; ; Respiratory: Negative for cough, wheezing and stridor. ; ; Gastrointestinal: Negative for nausea, vomiting, diarrhea, abdominal pain, blood in stool, hematemesis, jaundice and rectal bleeding. . ; ; Genitourinary: Negative for dysuria, flank pain and hematuria. ; ; Musculoskeletal: +LBP. Negative for neck pain. Negative for swelling and deformity.; ; Skin: Negative for pruritus, rash, abrasions, blisters, bruising and skin lesion.; ; Neuro: Negative for headache, lightheadedness and neck stiffness. Negative for weakness, extremity weakness, paresthesias, involuntary movement, seizure and +syncope.     Allergies  Penicillins  Home Medications   Prior to Admission medications   Medication Sig Start Date End Date Taking? Authorizing Provider  amphetamine-dextroamphetamine (ADDERALL XR) 30 MG 24 hr capsule Take 60 mg by mouth daily.   Yes Historical Provider, MD  Phenylephrine-Acetaminophen ([employer] SINUS HEADACHE PE PO) Take 2 tablets by mouth 2 (two) times daily as needed (sinus congestion).   Yes Historical Provider, MD  traZODone (DESYREL) 50 MG tablet Take 50 mg by mouth at bedtime.  Yes Historical Provider, MD  ibuprofen (ADVIL,MOTRIN) 200 MG tablet Take 200 mg by mouth every 6 (six) hours as needed.    Historical Provider, MD   BP 120/58 mmHg  Pulse 61  Temp(Src) 97.4 F (36.3 C) (Oral)  Resp 11  SpO2 93%   13:20 Orthostatic Vital Signs FH  Orthostatic Lying  - BP- Lying: 122/71 mmHg ; Pulse- Lying: 63  Orthostatic Sitting - BP- Sitting: 108/67 mmHg ; Pulse- Sitting: 74  Orthostatic Standing at 0 minutes - BP- Standing at 0 minutes:  (pt unable to stand) ; Pulse-  Standing at 0 minutes:  (pt unable to stand)    Physical Exam  1055: Physical examination: Vital signs and O2 SAT: Reviewed; Constitutional: Well developed, Well nourished, Well hydrated, Uncomfortable appearing.;; Head and Face: Normocephalic, Atraumatic; Eyes: EOMI, PERRL, No scleral icterus; ENMT: Mouth and pharynx normal, Mucous membranes moist; Neck: Supple, Trachea midline; Spine: +TTP left lumbar paraspinal muscles. No rash, no abrasions, no ecchymosis. No midline CS, TS, LS tenderness.; Cardiovascular: Regular rate and rhythm, No gallop; Respiratory: Breath sounds clear & equal bilaterally, No wheezes, Normal respiratory effort/excursion; Chest: Nontender, No deformity, Movement normal, No crepitus, No abrasions or ecchymosis.; Abdomen: Soft, Nontender, Nondistended, Normal bowel sounds, No abrasions or ecchymosis.; Genitourinary: No CVA tenderness;; Extremities: No deformity, Full range of motion major/large joints of bilat UE's and LE's without pain or tenderness to palp, Neurovascularly intact, Pulses normal, No tenderness, No edema, Pelvis stable; Neuro: AA&Ox3, GCS 15.  Major CN grossly intact. Speech clear. No gross focal motor or sensory deficits in extremities. Strength 5/5 equal bilat UE's and LE's, including great toe dorsiflexion.  DTR 2/4 equal bilat UE's and LE's.  No gross sensory deficits.  Neg straight leg raises bilat.; Skin: Color normal, Warm, Dry.   ED Course  Procedures     EKG Interpretation   Date/Time:  Wednesday March 28 2014 10:18:02 EST Ventricular Rate:  64 PR Interval:  172 QRS Duration: 101 QT Interval:  469 QTC Calculation: 484 R Axis:   47 Text Interpretation:  Sinus rhythm Borderline prolonged QT interval When  compared with ECG of 06/13/2013 QT has lengthened Confirmed by [facility]   MD, Nunzio Cory [phone]) on 03/28/2014 11:48:48 AM      MDM  MDM Reviewed: previous chart, nursing note and vitals Reviewed previous: labs and ECG Interpretation:  labs, ECG, x-ray and CT scan     Results for orders placed or performed during the hospital encounter of 03/28/14  CBC  (at [facility])  Result Value Ref Range   WBC 5.0 4.0 - 10.5 K/uL   RBC 5.09 4.22 - 5.81 MIL/uL   Hemoglobin 15.3 13.0 - 17.0 g/dL   HCT 46.4 39.0 - 52.0 %   MCV 91.2 78.0 - 100.0 fL   MCH 30.1 26.0 - 34.0 pg   MCHC 33.0 30.0 - 36.0 g/dL   RDW 13.3 11.5 - 15.5 %   Platelets 141 (L) 150 - 400 K/uL  Basic metabolic panel  (at [facility])  Result Value Ref Range   Sodium 138 135 - 145 mmol/L   Potassium 4.0 3.5 - 5.1 mmol/L   Chloride 110 96 - 112 mEq/L   CO2 21 19 - 32 mmol/L   Glucose, Bld 122 (H) 70 - 99 mg/dL   BUN 26 (H) 6 - 23 mg/dL   Creatinine, Ser 0.95 0.50 - 1.35 mg/dL   Calcium 8.5 8.4 - 10.5 mg/dL   GFR calc non Af  Amer 83 (L) >90 mL/min   GFR calc Af Amer >90 >90 mL/min   Anion gap 7 5 - 15  Urinalysis, Routine w reflex microscopic  Result Value Ref Range   Color, Urine AMBER (A) YELLOW   APPearance CLOUDY (A) CLEAR   Specific Gravity, Urine 1.030 1.005 - 1.030   pH 5.0 5.0 - 8.0   Glucose, UA NEGATIVE NEGATIVE mg/dL   Hgb urine dipstick NEGATIVE NEGATIVE   Bilirubin Urine NEGATIVE NEGATIVE   Ketones, ur NEGATIVE NEGATIVE mg/dL   Protein, ur NEGATIVE NEGATIVE mg/dL   Urobilinogen, UA 1.0 0.0 - 1.0 mg/dL   Nitrite NEGATIVE NEGATIVE   Leukocytes, UA NEGATIVE NEGATIVE  CBG monitoring, ED  Result Value Ref Range   Glucose-Capillary 116 (H) 70 - 99 mg/dL   Dg Chest 1 View 03/28/2014   CLINICAL DATA:  Status post fall subsequent to a syncopal episode currently asymptomatic.  EXAM: CHEST - 1 VIEW obtained in the [facility] supine position  COMPARISON:  PA and lateral chest x-ray of September 26, 2010  FINDINGS: The lungs are adequately inflated. There is no focal infiltrate. There is no pleural effusion or pneumothorax. The heart and pulmonary vascularity are normal. The mediastinum is normal in width given the projection and positioning. The  bony thorax exhibits no acute abnormality.  IMPRESSION: There is no active cardiopulmonary disease.   Electronically Signed   By: David  Martinique   On: 03/28/2014 13:16   Ct Head Wo Contrast 03/28/2014   CLINICAL DATA:  History of syncopal episodes. Passed out at 4 a.m. Woke up on floor.  EXAM: CT HEAD WITHOUT CONTRAST  CT CERVICAL SPINE WITHOUT CONTRAST  TECHNIQUE: Multidetector CT imaging of the head and cervical spine was performed following the standard protocol without intravenous contrast. Multiplanar CT image reconstructions of the cervical spine were also generated.  COMPARISON:  06/13/2013  FINDINGS: CT HEAD FINDINGS  Prominence of the sulci and ventricles identified. No evidence for acute brain infarct, intracranial hemorrhage or mass. No abnormal extra-axial fluid collections identified. The paranasal sinuses and the mastoid air cells are well aerated. The skull appears intact.  CT CERVICAL SPINE FINDINGS  Normal alignment of the cervical spine. The patient is status post solid fusion of C5 through C7. Ventral endplate syndesmotic fight extends to the superior endplate of C5. There is fairly significant facet hypertrophy and degenerative change noted. No evidence for cervical spine fracture.  IMPRESSION: 1. Normal brain for age.  No acute intracranial abnormalities noted. 2. 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 No evidence for cervical spine fracture.   Electronically Signed   By: Kerby Moors M.D.   On: 03/28/2014 12:41   Ct Cervical Spine Wo Contrast 03/28/2014   CLINICAL DATA:  History of syncopal episodes. Passed out at 4 a.m. Woke up on floor.  EXAM: CT HEAD WITHOUT CONTRAST  CT CERVICAL SPINE WITHOUT CONTRAST  TECHNIQUE: Multidetector CT imaging of the head and cervical spine was performed following the standard protocol without intravenous contrast. Multiplanar CT image reconstructions of the cervical spine were also generated.  COMPARISON:  06/13/2013  FINDINGS:  CT HEAD FINDINGS  Prominence of the sulci and ventricles identified. No evidence for acute brain infarct, intracranial hemorrhage or mass. No abnormal extra-axial fluid collections identified. The paranasal sinuses and the mastoid air cells are well aerated. The skull appears intact.  CT CERVICAL SPINE FINDINGS  Normal alignment of the cervical spine. The patient is status post solid fusion of C5 through  C7. Ventral endplate syndesmotic fight extends to the superior endplate of C5. There is fairly significant facet hypertrophy and degenerative change noted. No evidence for cervical spine fracture.  IMPRESSION: 1. Normal brain for age.  No acute intracranial abnormalities noted. 2. 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 No evidence for cervical spine fracture.   Electronically Signed   By: Kerby Moors M.D.   On: 03/28/2014 12:41   Ct Lumbar Spine Wo Contrast 03/28/2014   CLINICAL DATA:  [place] this morning.  Severe back pain.  EXAM: CT LUMBAR SPINE WITHOUT CONTRAST  TECHNIQUE: Multidetector CT imaging of the lumbar spine was performed without intravenous contrast administration. Multiplanar CT image reconstructions were also generated.  COMPARISON:  Lumbar spine MRI 06/12/2007  FINDINGS: The sagittal reformatted images demonstrate normal alignment of the lumbar vertebral bodies. No acute lumbar compression fractures identified. There are postoperative changes with posterior and interbody fusion changes at L4-5 and L5-S1. No complicating features are demonstrated. No fractured hardware. Moderate degenerative changes involving the spine with bridging osteophytes. There is also advanced facet disease with wide decompressive laminectomies at L4-5 and L5-S1.  No significant paraspinal or retroperitoneal findings. Moderate atherosclerotic calcifications involving the aorta. The visualized bony pelvis is intact. No obvious pelvic fractures. The SI joints are intact.  No obvious lumbar disc  protrusions, significant spinal or foraminal stenosis in the lumbar spine.  IMPRESSION: Postoperative changes with L4-5 and L5-S1 posterior and interbody fusion. No complicating features.  No acute lumbar compression fracture.   Electronically Signed   By: Kalman Jewels M.D.   On: 03/28/2014 12:43   Dg Hip Unilat With Pelvis 2-3 Views Left 03/28/2014   CLINICAL DATA:  Status post syncopal episode and subsequent fall now with lower left lumbar pain radiating into the left hip  EXAM: DG HIP W/ PELVIS 2-3V*L*  COMPARISON:  None.  FINDINGS: The bony pelvis exhibits no lytic or blastic lesion no acute pelvic fracture is demonstrated. The observed portions of the sacrum are normal. The patient has undergone posterior fusion and intradiscal device placement at L4-5 and L5-S1.  AP and frog-leg lateral views of the left hip reveal no more than minimal joint space narrowing. The femoral head and acetabulum remains smoothly rounded. The femoral neck and intertrochanteric regions are normal.  IMPRESSION: There is no acute bony abnormality of the left hip.   Electronically Signed   By: David  Martinique   On: 03/28/2014 13:18    1405:  Pt is orthostatic on VS, unable to stand without c/o near syncope. Dx and testing d/w pt and family.  Questions answered.  Verb understanding, agreeable to admit.  T/C to [facility] Dr. Dyann Kief, case discussed, including:  HPI, pertinent PM/SHx, VS/PE, dx testing, ED course and treatment:  Agreeable to observation admit, requests to write temporary orders, obtain tele bed to team [facility].   Francine Graven, DO 03/31/14 [phone]

## 2014-03-28 NOTE — H&P (Signed)
[facility] History and Physical  Andrew Stevenson ASN:[id number] DOB: 1945/08/25 DOA: 03/28/2014  Referring physician: Dr. Thurnell Garbe  PCP: Stephens Shire, MD   Chief Complaint: syncope  HPI: Andrew Stevenson is a 69 y.o. male with PMH significant for HTN (treated with diet controlled); chronic back pain, ADD and prior hx of heavy alcohol use. Presented to ED after experiencing syncope and collapse. Patient denies any prodromic symptom or aura (other than mild dizziness); no CP, SOB, palpitation, seizure activity, post ictal event, incontinence or any other complaints. He report wakening up around 2-3 am for snack and while standing in the kitchen he felt a little dizzy and next thing he wife was waking kim up from the floor. Patient is now complaining of excruciating pain in her back, but denies any abnormal neurologic deficit. X-ray survey and CT head in ED neg. EKG with mild QT prolongation and positive orthostatic changes. [facility] called to admit patient for further evaluation and treatment.    Review of Systems:  Negative except as otherwise mentioned on HPI.  Past Medical History  Diagnosis Date  . Chronic back pain   . Arthritis   . Personal history of other mental disorder     ADD  . Hypertension   . Myalgia and myositis, unspecified    Past Surgical History  Procedure Laterality Date  . Neck surgery      x 2  . Back surgery      x 2  . Eye surgery      5 right eye, 3 left eye surgeries   Social History:  reports that he has never smoked. His smokeless tobacco use includes Chew. He reports that he does not drink alcohol or use illicit drugs.  Allergies  Allergen Reactions  . Penicillins     unknown    Family History  Problem Relation Age of Onset  . Depression Mother   . Diabetes Brother      Prior to Admission medications   Medication Sig Start Date End Date Taking? Authorizing Provider  amphetamine-dextroamphetamine (ADDERALL XR) 30 MG 24 hr capsule Take 60 mg  by mouth daily.   Yes Historical Provider, MD  Phenylephrine-Acetaminophen ([employer] SINUS HEADACHE PE PO) Take 2 tablets by mouth 2 (two) times daily as needed (sinus congestion).   Yes Historical Provider, MD  traZODone (DESYREL) 50 MG tablet Take 50 mg by mouth at bedtime.   Yes Historical Provider, MD  ibuprofen (ADVIL,MOTRIN) 200 MG tablet Take 200 mg by mouth every 6 (six) hours as needed.    Historical Provider, MD   Physical Exam: Filed Vitals:   03/28/14 1447 03/28/14 1520 03/28/14 1525 03/28/14 1554  BP: 110/63   117/65  Pulse: 62   57  Temp: 97.9 F (36.6 C)   97.8 F (36.6 C)  TempSrc: Oral   Oral  Resp: 20   18  Height:    6\' 4"  (1.93 m)  Weight:    104.327 kg (230 lb)  SpO2: 94% 89% 95% 92%    Wt Readings from Last 3 Encounters:  03/28/14 104.327 kg (230 lb)  06/13/13 104.327 kg (230 lb)    General:  Appears AAOX3; mild distress due to back pain and intermittent episodes of muscle spasm. No CP, no SOB, no fever and denies palpitations Eyes: PERRL, normal lids, irises & conjunctiva; no nystagmus ENT: grossly normal hearing, lips & tongue; denture in place, no erythema or exudates; MMM Neck: no LAD, masses or thyromegaly, no JVD Cardiovascular: RRR,  no m/r/g. No LE edema. Telemetry: SR, no arrhythmias  Respiratory: CTA bilaterally, no w/r/r. Normal respiratory effort. Abdomen: soft, nt, nd; positive BS Skin: no rash or induration seen on limited exam Musculoskeletal: grossly normal tone BUE/BLE Psychiatric: grossly normal mood and affect, speech fluent and appropriate Neurologic: grossly non-focal.          Labs on Admission:  Basic Metabolic Panel:  Recent Labs Lab 03/28/14 1030  NA 138  K 4.0  CL 110  CO2 21  GLUCOSE 122*  BUN 26*  CREATININE 0.95  CALCIUM 8.5   Liver Function Tests:  Recent Labs Lab 03/28/14 1030  AST 27  ALT 21  ALKPHOS 49  BILITOT 1.6*  PROT 6.3  ALBUMIN 3.9   CBC:  Recent Labs Lab 03/28/14 1030  WBC 5.0  HGB 15.3    HCT 46.4  MCV 91.2  PLT 141*   Cardiac Enzymes:  Recent Labs Lab 03/28/14 1030  TROPONINI <0.03   CBG:  Recent Labs Lab 03/28/14 1023  GLUCAP 116*    Radiological Exams on Admission: Dg Chest 1 View  03/28/2014   CLINICAL DATA:  Status post fall subsequent to a syncopal episode currently asymptomatic.  EXAM: CHEST - 1 VIEW obtained in the AP supine position  COMPARISON:  PA and lateral chest x-ray of September 26, 2010  FINDINGS: The lungs are adequately inflated. There is no focal infiltrate. There is no pleural effusion or pneumothorax. The heart and pulmonary vascularity are normal. The mediastinum is normal in width given the projection and positioning. The bony thorax exhibits no acute abnormality.  IMPRESSION: There is no active cardiopulmonary disease.   Electronically Signed   By: David  Martinique   On: 03/28/2014 13:16   Ct Head Wo Contrast  03/28/2014   CLINICAL DATA:  History of syncopal episodes. Passed out at 4 a.m. Woke up on floor.  EXAM: CT HEAD WITHOUT CONTRAST  CT CERVICAL SPINE WITHOUT CONTRAST  TECHNIQUE: Multidetector CT imaging of the head and cervical spine was performed following the standard protocol without intravenous contrast. Multiplanar CT image reconstructions of the cervical spine were also generated.  COMPARISON:  06/13/2013  FINDINGS: CT HEAD FINDINGS  Prominence of the sulci and ventricles identified. No evidence for acute brain infarct, intracranial hemorrhage or mass. No abnormal extra-axial fluid collections identified. The paranasal sinuses and the mastoid air cells are well aerated. The skull appears intact.  CT CERVICAL SPINE FINDINGS  Normal alignment of the cervical spine. The patient is status post solid fusion of C5 through C7. Ventral endplate syndesmotic fight extends to the superior endplate of C5. There is fairly significant facet hypertrophy and degenerative change noted. No evidence for cervical spine fracture.  IMPRESSION: 1. Normal brain for age.   No acute intracranial abnormalities noted. 2. 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 No evidence for cervical spine fracture.   Electronically Signed   By: Kerby Moors M.D.   On: 03/28/2014 12:41   Ct Cervical Spine Wo Contrast  03/28/2014   CLINICAL DATA:  History of syncopal episodes. Passed out at 4 a.m. Woke up on floor.  EXAM: CT HEAD WITHOUT CONTRAST  CT CERVICAL SPINE WITHOUT CONTRAST  TECHNIQUE: Multidetector CT imaging of the head and cervical spine was performed following the standard protocol without intravenous contrast. Multiplanar CT image reconstructions of the cervical spine were also generated.  COMPARISON:  06/13/2013  FINDINGS: CT HEAD FINDINGS  Prominence of the sulci and ventricles identified. No evidence for  acute brain infarct, intracranial hemorrhage or mass. No abnormal extra-axial fluid collections identified. The paranasal sinuses and the mastoid air cells are well aerated. The skull appears intact.  CT CERVICAL SPINE FINDINGS  Normal alignment of the cervical spine. The patient is status post solid fusion of C5 through C7. Ventral endplate syndesmotic fight extends to the superior endplate of C5. There is fairly significant facet hypertrophy and degenerative change noted. No evidence for cervical spine fracture.  IMPRESSION: 1. Normal brain for age.  No acute intracranial abnormalities noted. 2. 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 No evidence for cervical spine fracture.   Electronically Signed   By: Kerby Moors M.D.   On: 03/28/2014 12:41   Ct Lumbar Spine Wo Contrast  03/28/2014   CLINICAL DATA:  [place] this morning.  Severe back pain.  EXAM: CT LUMBAR SPINE WITHOUT CONTRAST  TECHNIQUE: Multidetector CT imaging of the lumbar spine was performed without intravenous contrast administration. Multiplanar CT image reconstructions were also generated.  COMPARISON:  Lumbar spine MRI 06/12/2007  FINDINGS: The sagittal  reformatted images demonstrate normal alignment of the lumbar vertebral bodies. No acute lumbar compression fractures identified. There are postoperative changes with posterior and interbody fusion changes at L4-5 and L5-S1. No complicating features are demonstrated. No fractured hardware. Moderate degenerative changes involving the spine with bridging osteophytes. There is also advanced facet disease with wide decompressive laminectomies at L4-5 and L5-S1.  No significant paraspinal or retroperitoneal findings. Moderate atherosclerotic calcifications involving the aorta. The visualized bony pelvis is intact. No obvious pelvic fractures. The SI joints are intact.  No obvious lumbar disc protrusions, significant spinal or foraminal stenosis in the lumbar spine.  IMPRESSION: Postoperative changes with L4-5 and L5-S1 posterior and interbody fusion. No complicating features.  No acute lumbar compression fracture.   Electronically Signed   By: Kalman Jewels M.D.   On: 03/28/2014 12:43   Dg Hip Unilat With Pelvis 2-3 Views Left  03/28/2014   CLINICAL DATA:  Status post syncopal episode and subsequent fall now with lower left lumbar pain radiating into the left hip  EXAM: DG HIP W/ PELVIS 2-3V*L*  COMPARISON:  None.  FINDINGS: The bony pelvis exhibits no lytic or blastic lesion no acute pelvic fracture is demonstrated. The observed portions of the sacrum are normal. The patient has undergone posterior fusion and intradiscal device placement at L4-5 and L5-S1.  AP and frog-leg lateral views of the left hip reveal no more than minimal joint space narrowing. The femoral head and acetabulum remains smoothly rounded. The femoral neck and intertrochanteric regions are normal.  IMPRESSION: There is no acute bony abnormality of the left hip.   Electronically Signed   By: David  Martinique   On: 03/28/2014 13:18    EKG:  No acute ischemic changes; mild QT prolongation  Assessment/Plan 1-Syncope and collapse: appears to be  secondary to orthostatic changes. Patient reports taking some sleeping pills the night before the event (which can also be part of the problem). No prodromic symptoms; no seizure activities or incontinence. There was sweating and dizziness associated after regaining consciousness; no post ictal state. -will admit to telemetry -CT head neg -will check B12 and TSH -will check 2-D echo -IVF's resuscitation and cortisol level -will discontinue trazodone -follow troponin (given diaphoresis, age and hx of HTN), repeat EKG in am -PT evaluation and treatment -if work up neg will recommend event monitoring as an outpatient   2-Orthostatic hypotension: as seen in ED -will provide  IVF's as mentioned above -repeat orthostatic VS in am  3-acute on chronic back pain: affecting Right-side of lower back without sciatica -secondary to fall during syncope -x-ray survey and CT neg for acute fractures -will treat with toradol, robaxin if pain unrelieved dilaudid  4-HTN (hypertension): control with diet at home -slight elevated with pain; but orthostatic in ED -will monitor and provide IVF's -heart healthy diet   5-Insomnia: no sleeping aids medications for now -will discontinue trazodone  6-Prolonged Q-T interval on ECG: will monitor on telemetry -2- D echo ordered -follow EKG in am -trazodone d/c  7-hx of ADD: will continue daily adderal    Code Status: Full DVT Prophylaxis:lovenox Family Communication: no family at bedside Disposition Plan: observation; LOS < 2 midnights; telemetry  Time spent: 50 minutes  [place], [facility] Pager [phone]

## 2014-03-28 NOTE — ED Notes (Signed)
Pt in CT and x-ray.

## 2014-03-29 ENCOUNTER — Observation Stay (HOSPITAL_COMMUNITY): Payer: Managed Care, Other (non HMO)

## 2014-03-29 DIAGNOSIS — I359 Nonrheumatic aortic valve disorder, unspecified: Secondary | ICD-10-CM

## 2014-03-29 DIAGNOSIS — T671XXS Heat syncope, sequela: Secondary | ICD-10-CM

## 2014-03-29 HISTORY — PX: TRANSTHORACIC ECHOCARDIOGRAM: SHX275

## 2014-03-29 LAB — COMPREHENSIVE METABOLIC PANEL
ALT: 18 U/L (ref 0–53)
AST: 24 U/L (ref 0–37)
Albumin: 3.6 g/dL (ref 3.5–5.2)
Alkaline Phosphatase: 46 U/L (ref 39–117)
Anion gap: 7 (ref 5–15)
BUN: 22 mg/dL (ref 6–23)
CO2: 24 mmol/L (ref 19–32)
Calcium: 8.1 mg/dL — ABNORMAL LOW (ref 8.4–10.5)
Chloride: 104 mEq/L (ref 96–112)
Creatinine, Ser: 1.05 mg/dL (ref 0.50–1.35)
GFR calc Af Amer: 82 mL/min — ABNORMAL LOW (ref >90)
GFR calc non Af Amer: 70 mL/min — ABNORMAL LOW (ref >90)
Glucose, Bld: 126 mg/dL — ABNORMAL HIGH (ref 70–99)
Potassium: 3.8 mmol/L (ref 3.5–5.1)
Sodium: 135 mmol/L (ref 135–145)
Total Bilirubin: 1.1 mg/dL (ref 0.3–1.2)
Total Protein: 5.5 g/dL — ABNORMAL LOW (ref 6.0–8.3)

## 2014-03-29 LAB — URINE CULTURE: Colony Count: 3000

## 2014-03-29 LAB — VITAMIN B12: Vitamin B-12: 345 pg/mL (ref 211–911)

## 2014-03-29 LAB — TSH: TSH: 2.36 u[IU]/mL (ref 0.350–4.500)

## 2014-03-29 LAB — TROPONIN I
Troponin I: <0.03 ng/mL (ref <0.031)
Troponin I: <0.03 ng/mL (ref <0.031)

## 2014-03-29 LAB — CORTISOL: Cortisol, Plasma: 2 ug/dL

## 2014-03-29 MED ORDER — LORAZEPAM 2 MG/ML IJ SOLN
1.0000 mg | Freq: Once | INTRAMUSCULAR | Status: AC
Start: 2014-03-29 — End: 2014-03-29
  Administered 2014-03-29: 1 mg via INTRAVENOUS
  Filled 2014-03-29: qty 1

## 2014-03-29 MED ORDER — POLYETHYLENE GLYCOL 3350 17 G PO PACK
17.0000 g | PACK | Freq: Every day | ORAL | Status: DC
  Administered 2014-03-29 – 2014-03-31 (×3): 17 g via ORAL
  Filled 2014-03-29 (×2): qty 1

## 2014-03-29 MED ORDER — SENNOSIDES-DOCUSATE SODIUM 8.6-50 MG PO TABS
1.0000 | ORAL_TABLET | Freq: Two times a day (BID) | ORAL | Status: DC
  Administered 2014-03-29 – 2014-03-31 (×4): 1 via ORAL
  Filled 2014-03-29 (×4): qty 1

## 2014-03-29 NOTE — Progress Notes (Signed)
UR completed 

## 2014-03-29 NOTE — Care Management Note (Addendum)
    Page 1 of 1   03/29/2014     4:28:06 PM CARE MANAGEMENT NOTE 03/29/2014  Patient:  Andrew Stevenson, Andrew Stevenson   Account Number:  [id number]  Date Initiated:  03/29/2014  Documentation initiated by:  Dessa Phi  Subjective/Objective Assessment:   69 y/o m admitted w/syncope.     Action/Plan:   From home.   Anticipated DC Date:  03/30/2014   Anticipated DC Plan:  [place]  CM consult      Choice offered to / List presented to:             Status of service:  In process, will continue to follow Medicare Important Message given?   (If response is "NO", the following Medicare IM given date fields will be blank) Date Medicare IM given:   Medicare IM given by:   Date Additional Medicare IM given:   Additional Medicare IM given by:    Discharge Disposition:    Per UR Regulation:  Reviewed for med. necessity/level of care/duration of stay  If discussed at [place] of Stay Meetings, dates discussed:    Comments:  03/29/14 Dessa Phi RN BSN NCM [phone] PT-HH.[facility] chosen for HHPT.Kristen aware & following.Await HHPT order.Patient has rw already.

## 2014-03-29 NOTE — Evaluation (Signed)
Physical Therapy Evaluation Patient Details Name: Andrew Stevenson MRN: [id number] DOB: Nov 27, 1945 Today's Date: 03/29/2014   History of Present Illness  69 y.o. male with PMH significant for HTN (treated with diet controlled); chronic back pain with hx of neck and back surgery, ADD and prior hx of heavy alcohol use admitted after syncope and collapse episode at home.  Clinical Impression  Pt admitted with above diagnosis. Pt currently with functional limitations due to the deficits listed below (see PT Problem List).  Pt will benefit from skilled PT to increase their independence and safety with mobility to allow discharge to the venue listed below.  Pt reports current condition of dizziness has occurred 4 times this year and states he was diagnosed however cannot remember diagnosis only stating it offered no treatment.  Pt denied vertigo diagnosis.  Pt reports episodes of lightheadedness as well as spinning sensation.  Pt with impaired mobility requiring assist for balance and safety at this time.  Recommend if d/c home, 24/7 physical assist available otherwise pt may need SNF.  Pt presents has increased fall risk and reports hx of falls from past syncope episodes.  Pt also with chronic back pain and back spasms present today also limiting mobility.     Follow Up Recommendations Supervision/Assistance - 24 hour;Home health PT    Equipment Recommendations  Rolling walker with 5" wheels;3in1 (PT)    Recommendations for Other Services       Precautions / Restrictions Precautions Precautions: Fall      Mobility  Bed Mobility Overal bed mobility: Needs Assistance Bed Mobility: Supine to Sit     Supine to sit: Supervision     General bed mobility comments: increased time and effort, dizziness upon sitting  Transfers Overall transfer level: Needs assistance Equipment used: Rolling walker (2 wheeled) Transfers: Sit to/from Stand Sit to Stand: Mod assist         General transfer  comment: mod assist with initial stand as pt unable to keep balance and assisted back to sitting  Ambulation/Gait Ambulation/Gait assistance: Min assist Ambulation Distance (Feet): 80 Feet Assistive device: Rolling walker (2 wheeled) Gait Pattern/deviations: Step-through pattern;Trunk flexed;Staggering right;Staggering left     General Gait Details: very unsteady gait with assist for balance, pt requested kneeling break stating it helps resolve dizziness so assist to/from kneeling position for safety, verbal cues for safe use of RW  Stairs            Wheelchair Mobility    Modified Rankin (Stroke Patients Only)       Balance Overall balance assessment: Needs assistance         Standing balance support: Bilateral upper extremity supported Standing balance-Leahy Scale: Poor Standing balance comment: requires UE support for static standing                             Pertinent Vitals/Pain Pain Assessment: 0-10 Pain Score: 8  Pain Location: back Pain Descriptors / Indicators: Spasm;Aching Pain Intervention(s): Limited activity within patient's tolerance;Monitored during session;Premedicated before session;Repositioned    Home Living Family/patient expects to be discharged to:: Private residence Living Arrangements: Spouse/significant other   Type of Home: House       Home Layout: One level Home Equipment: None      Prior Function Level of Independence: Independent               Hand Dominance        Extremity/Trunk Assessment  Lower Extremity Assessment: Overall WFL for tasks assessed         Communication   Communication: No difficulties  Cognition Arousal/Alertness: Awake/alert Behavior During Therapy: WFL for tasks assessed/performed Overall Cognitive Status: Within Functional Limits for tasks assessed                      General Comments      Exercises        Assessment/Plan    PT  Assessment Patient needs continued PT services  PT Diagnosis Difficulty walking   PT Problem List Decreased activity tolerance;Decreased balance;Decreased mobility;Decreased knowledge of use of DME  PT Treatment Interventions DME instruction;Gait training;Functional mobility training;Patient/family education;Therapeutic activities;Therapeutic exercise;Balance training;Neuromuscular re-education   PT Goals (Current goals can be found in the Care Plan section) Acute Rehab PT Goals PT Goal Formulation: With patient Time For Goal Achievement: 04/05/14 Potential to Achieve Goals: Good    Frequency Min 3X/week   Barriers to discharge        Co-evaluation               End of Session Equipment Utilized During Treatment: Gait belt Activity Tolerance: Other (comment) (limited by dizziness) Patient left: in bed;with call bell/phone within reach;with bed alarm set      Functional Assessment Tool Used: clinical judgement Functional Limitation: Mobility: Walking and moving around Mobility: Walking and Moving Around Current Status (O9735): At least 20 percent but less than 40 percent impaired, limited or restricted Mobility: Walking and Moving Around Goal Status [phone]): At least 1 percent but less than 20 percent impaired, limited or restricted    Time: 0849-0910 PT Time Calculation (min) (ACUTE ONLY): 21 min   Charges:   PT Evaluation $Initial PT Evaluation Tier I: 1 Procedure PT Treatments $Gait Training: 8-22 mins   PT G Codes:   PT G-Codes **NOT FOR INPATIENT CLASS** Functional Assessment Tool Used: clinical judgement Functional Limitation: Mobility: Walking and moving around Mobility: Walking and Moving Around Current Status (E2683): At least 20 percent but less than 40 percent impaired, limited or restricted Mobility: Walking and Moving Around Goal Status [phone]): At least 1 percent but less than 20 percent impaired, limited or restricted    Reice Bienvenue,KATHrine E 03/29/2014,  12:44 PM Carmelia Bake, PT, DPT 03/29/2014 Pager: [phone]

## 2014-03-29 NOTE — Progress Notes (Signed)
Last night, patient had an episode of dizziness when coming back from the bathroom around 2100. NT was in room with patient and patient stated he went to kneel because that helps the feeling go away and then he was able to stand back up and finish walking to the bed.

## 2014-03-29 NOTE — Progress Notes (Signed)
Patient ID: Andrew Stevenson, male   DOB: January 11, 1946, 69 y.o.   MRN: [id number]  [facility] PROGRESS NOTE  Andrew Stevenson SVX:[id number] DOB: [birth year] DOA: 03/28/2014 PCP: Stephens Shire, MD  Brief narrative: 69 y.o. male with PMH significant for HTN (diet controlled) chronic back pain, ADD, and prior hx of heavy alcohol use, presented to [facility] ED after syncopal event at home. Pt explains that soon after the syncope he felt very sharp pain in the low back area, mostly on the left side and is now occasionally radiating to the right > left LE. He says its strange that left side of the back hurts but radiating to the right side. He reports similar events in the past associated with some LE numbness and tingling as if the leg is "going to seep".   Assessment and Plan:    Principal Problem:   Syncope - I do not suspect this to be cardiac in etiology, ? Pain provoked, ? Orthostatic hypotension  - i also ? Ongoing alcohol use given mild degree of thrombocytopenia (pt denies alcohol use) - CE x 3 negative, no events on telemetry  - 2 D ECHO pending  Active Problems:   Low back pain with LE weakness - pt able to ambulate but has intermittent episodes of weakness - spoke with Dr. 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     Ct Cervical Spine Wo Contrast  03/28/2014  Normal brain for age.  No acute intracranial abnormalities noted. 2. 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 No evidence for cervical spine fracture.     Ct Lumbar Spine Wo Contrast  03/28/2014  Postoperative changes with L4-5 and L5-S1 posterior and interbody fusion. No complicating features.  No acute lumbar compression fracture.    Dg Hip Unilat With Pelvis 2-3 Views Left  03/28/2014   There is no acute bony abnormality of the left hip.    Medical consultants:   Neurologist over the phone  Other consultants:   PT Anti-infectives:   None   Faye Ramsay, MD  [facility] Pager [phone]  If 7PM-7AM, please contact night-coverage [email or web address] Password [email or web address] 03/29/2014, 2:20 PM   LOS: 1 day   HPI/Subjective: No events overnight.   Objective: Filed Vitals:   03/28/14 1554 03/28/14 2104 03/29/14 0425 03/29/14 1312  BP: 117/65 107/61 104/56 125/59  Pulse: 57 63 60 69  Temp: 97.8 F (36.6 C) 97.5 F (36.4 C) 98.1 F (36.7 C) 99.3 F (37.4 C)  TempSrc: Oral Oral Oral Oral  Resp: 18 16 18 18   Height: 6\' 4"  (1.93 m)     Weight: 104.327 kg (230 lb)     SpO2: 92% 92% 94% 95%    Intake/Output Summary (Last 24 hours) at  03/29/14 1420 Last data filed at 03/29/14 0600  Gross per 24 hour  Intake 2331.67 ml  Output    325 ml  Net 2006.67 ml    Exam:   General:  Pt is alert, follows commands appropriately, not in acute distress  Cardiovascular: Regular rate and rhythm, no rubs, no gallops  Respiratory: Clear to auscultation bilaterally, no wheezing, no crackles, no rhonchi  Abdomen: Soft, non tender, non distended, bowel sounds present, no guarding  Extremities: No edema, pulses DP and PT palpable bilaterally  Neuro: Grossly non focal, TTP in low back area, L > R side   Data Reviewed: Basic Metabolic Panel:  Recent Labs Lab 03/28/14 1030  03/28/14 2028 03/29/14 0138  NA 138  --  135  K 4.0  --  3.8  CL 110  --  104  CO2 21  --  24  GLUCOSE 122*  --  126*  BUN 26*  --  22  CREATININE 0.95 0.96 1.05  CALCIUM 8.5  --  8.1*   Liver Function Tests:  Recent Labs Lab 03/28/14 1030 03/29/14 0138  AST 27 24  ALT 21 18  ALKPHOS 49 46  BILITOT 1.6* 1.1  PROT 6.3 5.5*  ALBUMIN 3.9 3.6   CBC:  Recent Labs Lab 03/28/14 1030 03/28/14 2028  WBC 5.0 5.4  HGB 15.3 14.5  HCT 46.4 45.1  MCV 91.2 92.2  PLT 141* 118*   Cardiac Enzymes:  Recent Labs Lab 03/28/14 1030 03/28/14 2028 03/29/14 0138 03/29/14 0710  TROPONINI <0.03 <0.03 <0.03 <0.03   CBG:  Recent Labs Lab 03/28/14 1023  GLUCAP 116*   Scheduled Meds: . amphetamine-dextroamphetamine  30 mg Oral Q breakfast  . enoxaparin (LOVENOX) injection  30 mg Subcutaneous Q24H  . LORazepam  1 mg Intravenous Once  . pantoprazole  40 mg Oral Q1200  . sodium chloride  3 mL Intravenous Q12H   Continuous Infusions: . sodium chloride 100 mL/hr at 03/29/14 0500

## 2014-03-29 NOTE — Progress Notes (Signed)
  Echocardiogram 2D Echocardiogram has been performed.  Bobbye Charleston 03/29/2014, 3:51 PM

## 2014-03-30 DIAGNOSIS — M5441 Lumbago with sciatica, right side: Secondary | ICD-10-CM

## 2014-03-30 LAB — BASIC METABOLIC PANEL
Anion gap: 8 (ref 5–15)
BUN: 12 mg/dL (ref 6–23)
CO2: 24 mmol/L (ref 19–32)
Calcium: 8.5 mg/dL (ref 8.4–10.5)
Chloride: 104 mEq/L (ref 96–112)
Creatinine, Ser: 0.89 mg/dL (ref 0.50–1.35)
GFR calc Af Amer: >90 mL/min (ref >90)
GFR calc non Af Amer: 85 mL/min — ABNORMAL LOW (ref >90)
Glucose, Bld: 127 mg/dL — ABNORMAL HIGH (ref 70–99)
Potassium: 4.2 mmol/L (ref 3.5–5.1)
Sodium: 136 mmol/L (ref 135–145)

## 2014-03-30 LAB — CBC
HCT: 45.1 % (ref 39.0–52.0)
Hemoglobin: 14.7 g/dL (ref 13.0–17.0)
MCH: 29.8 pg (ref 26.0–34.0)
MCHC: 32.6 g/dL (ref 30.0–36.0)
MCV: 91.3 fL (ref 78.0–100.0)
Platelets: 103 10*3/uL — ABNORMAL LOW (ref 150–400)
RBC: 4.94 MIL/uL (ref 4.22–5.81)
RDW: 13 % (ref 11.5–15.5)
WBC: 6 10*3/uL (ref 4.0–10.5)

## 2014-03-30 MED ORDER — BISACODYL 5 MG PO TBEC
5.0000 mg | DELAYED_RELEASE_TABLET | Freq: Once | ORAL | Status: AC
  Administered 2014-03-30: 5 mg via ORAL
  Filled 2014-03-30: qty 1

## 2014-03-30 NOTE — Progress Notes (Signed)
Patient ID: Andrew Stevenson, male   DOB: 07-31-1945, 69 y.o.   MRN: [id number]  [facility] PROGRESS NOTE  Andrew Stevenson DOB: 1946-02-13 DOA: 03/28/2014 PCP: Stephens Shire, MD  Brief narrative: 69 y.o. male with PMH significant for HTN (diet controlled) chronic back pain, ADD, and prior hx of heavy alcohol use, presented to [facility] ED after syncopal event at home. Pt explains that soon after the syncope he felt very sharp pain in the low back area, mostly on the left side and is now occasionally radiating to the right > left LE. He says its strange that left side of the back hurts but radiating to the right side. He reports similar events in the past associated with some LE numbness and tingling as if the leg is "going to seep".   Assessment and Plan:    Principal Problem:   Syncope -unclear etiology, orthstatics were negative - 2 D ECHO showed EF on 68-11% with diastolic dysfunction Active Problems:   Low back pain with LE weakness - pt able to ambulate but has intermittent episodes of weakness -MR of lumbar spine showing mild stenosis at T10-11 with out frank compression. No evidence of intraspinal lesion.  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 Lovenox stopped due to downward trend in platelets  Code Status: Full Family Communication: Pt at bedside Disposition Plan: Anticipate discharge to home in AM  IV access:   Peripheral IV Procedures and diagnostic studies;   Dg Chest 1 View  03/28/2014  There is no active cardiopulmonary disease.     Ct Cervical Spine Wo Contrast  03/28/2014  Normal brain for age.  No acute intracranial  abnormalities noted. 2. 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 No evidence for cervical spine fracture.     Ct Lumbar Spine Wo Contrast  03/28/2014  Postoperative changes with L4-5 and L5-S1 posterior and interbody fusion. No complicating features.  No acute lumbar compression fracture.    Dg Hip Unilat With Pelvis 2-3 Views Left  03/28/2014   There is no acute bony abnormality of the left hip.    Medical consultants:   Neurologist over the phone  Other consultants:   PT Anti-infectives:   None   Kelvin Cellar, MD  [facility] Pager [phone]  If 7PM-7AM, please contact night-coverage [email or web address] Password [facility] 03/30/2014, 3:37 PM   LOS: 2 days   HPI/Subjective: Patient reporting that his back pain still not controlled, having some difficulty getting around.   Objective: Filed Vitals:   03/29/14 1312 03/29/14 2046 03/30/14 0418 03/30/14 1427  BP: 125/59 124/61 117/53 116/54  Pulse: 69 66 62 51  Temp: 99.3 F (37.4 C) 98.2 F (36.8 C) 97.8 F (36.6 C) 98.1 F (36.7 C)  TempSrc: Oral Oral Oral Oral  Resp: 18 18 18 18   Height:      Weight:      SpO2: 95% 96% 97% 97%    Intake/Output Summary (Last 24 hours) at 03/30/14 1537 Last data filed at 03/30/14 1427  Gross per 24 hour  Intake    240 ml  Output   1075 ml  Net   -  835 ml    Exam:   General:  Pt is alert, follows commands appropriately, not in acute distress  Cardiovascular: Regular rate and rhythm, no rubs, no gallops  Respiratory: Clear to auscultation bilaterally, no wheezing, no crackles, no rhonchi  Abdomen: Soft, non tender, non distended, bowel sounds present, no guarding  Extremities: No edema, pulses DP and PT palpable bilaterally  Neuro: Grossly non focal, TTP in low back area, L > R side   Data Reviewed: Basic Metabolic Panel:  Recent Labs Lab 03/28/14 1030 03/28/14 2028 03/29/14 0138 03/30/14 0825  NA 138  --  135 136  K 4.0  --  3.8 4.2  CL 110  --  104 104   CO2 21  --  24 24  GLUCOSE 122*  --  126* 127*  BUN 26*  --  22 12  CREATININE 0.95 0.96 1.05 0.89  CALCIUM 8.5  --  8.1* 8.5   Liver Function Tests:  Recent Labs Lab 03/28/14 1030 03/29/14 0138  AST 27 24  ALT 21 18  ALKPHOS 49 46  BILITOT 1.6* 1.1  PROT 6.3 5.5*  ALBUMIN 3.9 3.6   CBC:  Recent Labs Lab 03/28/14 1030 03/28/14 2028 03/30/14 0825  WBC 5.0 5.4 6.0  HGB 15.3 14.5 14.7  HCT 46.4 45.1 45.1  MCV 91.2 92.2 91.3  PLT 141* 118* 103*   Cardiac Enzymes:  Recent Labs Lab 03/28/14 1030 03/28/14 2028 03/29/14 0138 03/29/14 0710  TROPONINI <0.03 <0.03 <0.03 <0.03   CBG:  Recent Labs Lab 03/28/14 1023  GLUCAP 116*   Scheduled Meds: . amphetamine-dextroamphetamine  30 mg Oral Q breakfast  . pantoprazole  40 mg Oral Q1200  . polyethylene glycol  17 g Oral Daily  . senna-docusate  1 tablet Oral BID  . sodium chloride  3 mL Intravenous Q12H   Continuous Infusions:

## 2014-03-30 NOTE — Progress Notes (Signed)
PT Cancellation Note  Patient Details Name: Andrew Stevenson MRN: [id number] DOB: 01-Oct-1945   Cancelled Treatment:    Reason Eval/Treat Not Completed: Other (comment) Pt reports he wants to go home.  Pt did not wish to mobilize prior to d/c, stating he was getting to/from bathroom with RW okay.  Agreeable to use RW upon d/c home (has RW at home).  Plans to f/u with outpatient MD for back pain.   Daevion Navarette,KATHrine E 03/30/2014, 11:09 AM Carmelia Bake, PT, DPT 03/30/2014 Pager: [phone]

## 2014-03-31 MED ORDER — OXYCODONE HCL 5 MG PO TABS: 10.0000 mg | ORAL_TABLET | Freq: Four times a day (QID) | ORAL | Status: DC | PRN

## 2014-03-31 MED ORDER — METHOCARBAMOL 500 MG PO TABS: 500.0000 mg | ORAL_TABLET | Freq: Three times a day (TID) | ORAL | Status: DC | PRN

## 2014-03-31 NOTE — Progress Notes (Signed)
Utilization Review completed.  

## 2014-03-31 NOTE — Discharge Summary (Signed)
Physician Discharge Summary  Andrew Stevenson FUX:[id number] DOB: 1945-07-29 DOA: 03/28/2014  PCP: Stephens Shire, MD  Admit date: 03/28/2014 Discharge date: 03/31/2014  Time spent: 35 minutes  Recommendations for Outpatient Follow-up:  1. Please follow up on patient's back pain  2. He was discharged on Oxycodone 10 mg PO q 6 hours as needed for severe pain, qty # 12  Discharge Diagnoses:  Principal Problem:   Syncope Active Problems:   Orthostatic hypotension   Right-sided low back pain without sciatica   HTN (hypertension)   Insomnia   Prolonged Q-T interval on ECG   Syncope and collapse   Discharge Condition: Stable  Diet recommendation: Heart Healthy  Filed Weights   03/28/14 1554  Weight: 104.327 kg (230 lb)    History of present illness:  Andrew Stevenson is a 69 y.o. male with PMH significant for HTN (treated with diet controlled); chronic back pain, ADD and prior hx of heavy alcohol use. Presented to ED after experiencing syncope and collapse. Patient denies any prodromic symptom or aura (other than mild dizziness); no CP, SOB, palpitation, seizure activity, post ictal event, incontinence or any other complaints. He report wakening up around 2-3 am for snack and while standing in the kitchen he felt a little dizzy and next thing he wife was waking kim up from the floor. Patient is now complaining of excruciating pain in her back, but denies any abnormal neurologic deficit. X-ray survey and CT head in ED neg. EKG with mild QT prolongation and positive orthostatic changes. [facility] called to admit patient for further evaluation and treatment.   Hospital Course:  Patient is a 69 year old gentleman with a past medical history of hypertension, chronic back pain, who was admitted to the medicine service on 03/28/2014. He presented to the hospital after having a syncopal event at home while standing in the kitchen. Initial workup included a CT scan of brain which did not reveal acute  intracranial abnormalities. Labs were unremarkable as troponin was negative and EKG did not show acute ischemic changes. Patient complained of severe back pain. MRI of lumbar spine revealed solid-appearing L4-L5 and L5-S1 fusions, no significant adjacent segment disease or spinal stenosis noted. There was mild stenosis at T 10 and 11 but no evidence of cord compression. Suspected syncope secondary to vasovagal event. During this hospitalization he was not out to be orthostatic, cardiac enzymes remain negative, no changes seen on telemetry. By 03/31/2014 he reported back pain improved, was able to ambulate around his room with a walker. Patient was discharged to his home in stable condition on 03/31/2014 with instructions to follow-up with his spinal surgeon early this week.  Procedures:  Transthoracic echocardiogram performed on 03/29/2014 showing EF of 55-60%, no washing abnormalities. Grade 1 diastolic dysfunction.   Discharge Exam: Filed Vitals:   03/31/14 0629  BP: 127/68  Pulse: 54  Temp: 99 F (37.2 C)  Resp: 18    General: Patient was awake, alert, no acute distress, states feeling better, looking forward to going home today. Cardiovascular: Regular rate and rhythm normal S1-S2 Respiratory: Lungs clear to auscultation bilaterally no wheezing rhonchi rales Abdomen: Soft nontender nondistended  Discharge Instructions   Discharge Instructions    Call MD for:  difficulty breathing, headache or visual disturbances    Complete by:  As directed      Call MD for:  extreme fatigue    Complete by:  As directed      Call MD for:  hives    Complete  by:  As directed      Call MD for:  persistant dizziness or light-headedness    Complete by:  As directed      Call MD for:  persistant nausea and vomiting    Complete by:  As directed      Call MD for:  redness, tenderness, or signs of infection (pain, swelling, redness, odor or green/yellow discharge around incision site)    Complete by:   As directed      Call MD for:  severe uncontrolled pain    Complete by:  As directed      Call MD for:  temperature >100.4    Complete by:  As directed      Diet - low sodium heart healthy    Complete by:  As directed      Increase activity slowly    Complete by:  As directed           Current Discharge Medication List    START taking these medications   Details  methocarbamol (ROBAXIN) 500 MG tablet Take 1 tablet (500 mg total) by mouth every 8 (eight) hours as needed for muscle spasms. Qty: 20 tablet, Refills: 0    oxyCODONE (ROXICODONE) 5 MG immediate release tablet Take 2 tablets (10 mg total) by mouth every 6 (six) hours as needed for severe pain. Qty: 12 tablet, Refills: 0      CONTINUE these medications which have NOT CHANGED   Details  amphetamine-dextroamphetamine (ADDERALL XR) 30 MG 24 hr capsule Take 60 mg by mouth daily.    Phenylephrine-Acetaminophen ([employer] SINUS HEADACHE PE PO) Take 2 tablets by mouth 2 (two) times daily as needed (sinus congestion).    traZODone (DESYREL) 50 MG tablet Take 50 mg by mouth at bedtime.    ibuprofen (ADVIL,MOTRIN) 200 MG tablet Take 200 mg by mouth every 6 (six) hours as needed.       Allergies  Allergen Reactions  . Penicillins     unknown      The results of significant diagnostics from this hospitalization (including imaging, microbiology, ancillary and laboratory) are listed below for reference.    Significant Diagnostic Studies: Dg Chest 1 View  03/28/2014   CLINICAL DATA:  Status post fall subsequent to a syncopal episode currently asymptomatic.  EXAM: CHEST - 1 VIEW obtained in the AP supine position  COMPARISON:  PA and lateral chest x-ray of September 26, 2010  FINDINGS: The lungs are adequately inflated. There is no focal infiltrate. There is no pleural effusion or pneumothorax. The heart and pulmonary vascularity are normal. The mediastinum is normal in width given the projection and positioning. The bony thorax exhibits  no acute abnormality.  IMPRESSION: There is no active cardiopulmonary disease.   Electronically Signed   By: David  Martinique   On: 03/28/2014 13:16   Ct Head Wo Contrast  03/28/2014   CLINICAL DATA:  History of syncopal episodes. Passed out at 4 a.m. Woke up on floor.  EXAM: CT HEAD WITHOUT CONTRAST  CT CERVICAL SPINE WITHOUT CONTRAST  TECHNIQUE: Multidetector CT imaging of the head and cervical spine was performed following the standard protocol without intravenous contrast. Multiplanar CT image reconstructions of the cervical spine were also generated.  COMPARISON:  06/13/2013  FINDINGS: CT HEAD FINDINGS  Prominence of the sulci and ventricles identified. No evidence for acute brain infarct, intracranial hemorrhage or mass. No abnormal extra-axial fluid collections identified. The paranasal sinuses and the mastoid air cells are well aerated. The skull  appears intact.  CT CERVICAL SPINE FINDINGS  Normal alignment of the cervical spine. The patient is status post solid fusion of C5 through C7. Ventral endplate syndesmotic fight extends to the superior endplate of C5. There is fairly significant facet hypertrophy and degenerative change noted. No evidence for cervical spine fracture.  IMPRESSION: 1. Normal brain for age.  No acute intracranial abnormalities noted. 2. 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 No evidence for cervical spine fracture.   Electronically Signed   By: Kerby Moors M.D.   On: 03/28/2014 12:41   Ct Cervical Spine Wo Contrast  03/28/2014   CLINICAL DATA:  History of syncopal episodes. Passed out at 4 a.m. Woke up on floor.  EXAM: CT HEAD WITHOUT CONTRAST  CT CERVICAL SPINE WITHOUT CONTRAST  TECHNIQUE: Multidetector CT imaging of the head and cervical spine was performed following the standard protocol without intravenous contrast. Multiplanar CT image reconstructions of the cervical spine were also generated.  COMPARISON:  06/13/2013  FINDINGS: CT HEAD FINDINGS   Prominence of the sulci and ventricles identified. No evidence for acute brain infarct, intracranial hemorrhage or mass. No abnormal extra-axial fluid collections identified. The paranasal sinuses and the mastoid air cells are well aerated. The skull appears intact.  CT CERVICAL SPINE FINDINGS  Normal alignment of the cervical spine. The patient is status post solid fusion of C5 through C7. Ventral endplate syndesmotic fight extends to the superior endplate of C5. There is fairly significant facet hypertrophy and degenerative change noted. No evidence for cervical spine fracture.  IMPRESSION: 1. Normal brain for age.  No acute intracranial abnormalities noted. 2. 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 No evidence for cervical spine fracture.   Electronically Signed   By: Kerby Moors M.D.   On: 03/28/2014 12:41   Ct Lumbar Spine Wo Contrast  03/28/2014   CLINICAL DATA:  [place] this morning.  Severe back pain.  EXAM: CT LUMBAR SPINE WITHOUT CONTRAST  TECHNIQUE: Multidetector CT imaging of the lumbar spine was performed without intravenous contrast administration. Multiplanar CT image reconstructions were also generated.  COMPARISON:  Lumbar spine MRI 06/12/2007  FINDINGS: The sagittal reformatted images demonstrate normal alignment of the lumbar vertebral bodies. No acute lumbar compression fractures identified. There are postoperative changes with posterior and interbody fusion changes at L4-5 and L5-S1. No complicating features are demonstrated. No fractured hardware. Moderate degenerative changes involving the spine with bridging osteophytes. There is also advanced facet disease with wide decompressive laminectomies at L4-5 and L5-S1.  No significant paraspinal or retroperitoneal findings. Moderate atherosclerotic calcifications involving the aorta. The visualized bony pelvis is intact. No obvious pelvic fractures. The SI joints are intact.  No obvious lumbar disc protrusions,  significant spinal or foraminal stenosis in the lumbar spine.  IMPRESSION: Postoperative changes with L4-5 and L5-S1 posterior and interbody fusion. No complicating features.  No acute lumbar compression fracture.   Electronically Signed   By: Kalman Jewels M.D.   On: 03/28/2014 12:43   Mr Lumbar Spine Wo Contrast  03/29/2014   CLINICAL DATA:  Chronic neck and back pain. History of surgery. BILATERAL leg weakness of unknown duration.  EXAM: MRI THORACIC AND LUMBAR SPINE WITHOUT CONTRAST  TECHNIQUE: Multiplanar and multiecho pulse sequences of the thoracic and lumbar spine were obtained without intravenous contrast. The patient could not cooperate to complete the full thoracic exam therefore axial images are not obtained through that region.  COMPARISON:  CT of the lumbar spine dated  03/28/2014.  FINDINGS: MR THORACIC SPINE FINDINGS  Sagittal localizer images demonstrate previous C5 through C7 fusion. Scan is insufficiently detailed to evaluate cervical region.  No thoracic compression fracture or worrisome osseous lesion. No cord signal abnormality is present. At T10-11, there is moderate ligamentum flavum hypertrophy which appears to mildly deform the adjacent subarachnoid space primarily to the sides. Centrally the cord appears to the have adequate room.  No intraspinal mass lesion. Within limits of visualization on sagittal imaging, paravertebral soft tissues unremarkable.  MR LUMBAR SPINE FINDINGS  The patient was unable to remain motionless for the exam. Small or subtle lesions could be overlooked.  Segmentation: Normal.  Alignment:  Normal.  Vertebrae: No worrisome osseous lesion.Solid appearing L4-5 and L5-S1 fusions with instrumentation. Baastrup's changes L1-2, L2-3, and L3-4.  Conus medullaris: Normal in size, signal, and location.  Paraspinal tissues: No evidence for hydronephrosis or paravertebral mass.  Disc levels:  L1-L2:  Normal.  L2-L3:  Normal disc space.  Mild facet arthropathy.  L3-L4:  Mild  bulge.  Mild facet arthropathy.  L4-L5:  Solid appearing fusion.  No impingement.  L5-S1:  Solid appearing fusion.  No impingement.  IMPRESSION: MR THORACIC SPINE IMPRESSION  Mild stenosis at T10-11 due to posterior element hypertrophy, but frank cord compression is not definitely established. No intraspinal lesion or other areas of concern. No thoracic compression deformity.  MR LUMBAR SPINE IMPRESSION  Solid appearing L4-5 and L5-S1 fusions. No significant adjacent segment disease or spinal stenosis.   Electronically Signed   By: Rolla Flatten M.D.   On: 03/29/2014 19:28   Mr T Spine Ltd W/o Cm  03/29/2014   CLINICAL DATA:  Chronic neck and back pain. History of surgery. BILATERAL leg weakness of unknown duration.  EXAM: MRI THORACIC AND LUMBAR SPINE WITHOUT CONTRAST  TECHNIQUE: Multiplanar and multiecho pulse sequences of the thoracic and lumbar spine were obtained without intravenous contrast. The patient could not cooperate to complete the full thoracic exam therefore axial images are not obtained through that region.  COMPARISON:  CT of the lumbar spine dated 03/28/2014.  FINDINGS: MR THORACIC SPINE FINDINGS  Sagittal localizer images demonstrate previous C5 through C7 fusion. Scan is insufficiently detailed to evaluate cervical region.  No thoracic compression fracture or worrisome osseous lesion. No cord signal abnormality is present. At T10-11, there is moderate ligamentum flavum hypertrophy which appears to mildly deform the adjacent subarachnoid space primarily to the sides. Centrally the cord appears to the have adequate room.  No intraspinal mass lesion. Within limits of visualization on sagittal imaging, paravertebral soft tissues unremarkable.  MR LUMBAR SPINE FINDINGS  The patient was unable to remain motionless for the exam. Small or subtle lesions could be overlooked.  Segmentation: Normal.  Alignment:  Normal.  Vertebrae: No worrisome osseous lesion.Solid appearing L4-5 and L5-S1 fusions with  instrumentation. Baastrup's changes L1-2, L2-3, and L3-4.  Conus medullaris: Normal in size, signal, and location.  Paraspinal tissues: No evidence for hydronephrosis or paravertebral mass.  Disc levels:  L1-L2:  Normal.  L2-L3:  Normal disc space.  Mild facet arthropathy.  L3-L4:  Mild bulge.  Mild facet arthropathy.  L4-L5:  Solid appearing fusion.  No impingement.  L5-S1:  Solid appearing fusion.  No impingement.  IMPRESSION: MR THORACIC SPINE IMPRESSION  Mild stenosis at T10-11 due to posterior element hypertrophy, but frank cord compression is not definitely established. No intraspinal lesion or other areas of concern. No thoracic compression deformity.  MR LUMBAR SPINE IMPRESSION  Solid appearing L4-5  and L5-S1 fusions. No significant adjacent segment disease or spinal stenosis.   Electronically Signed   By: Rolla Flatten M.D.   On: 03/29/2014 19:28   Dg Hip Unilat With Pelvis 2-3 Views Left  03/28/2014   CLINICAL DATA:  Status post syncopal episode and subsequent fall now with lower left lumbar pain radiating into the left hip  EXAM: DG HIP W/ PELVIS 2-3V*L*  COMPARISON:  None.  FINDINGS: The bony pelvis exhibits no lytic or blastic lesion no acute pelvic fracture is demonstrated. The observed portions of the sacrum are normal. The patient has undergone posterior fusion and intradiscal device placement at L4-5 and L5-S1.  AP and frog-leg lateral views of the left hip reveal no more than minimal joint space narrowing. The femoral head and acetabulum remains smoothly rounded. The femoral neck and intertrochanteric regions are normal.  IMPRESSION: There is no acute bony abnormality of the left hip.   Electronically Signed   By: David  Martinique   On: 03/28/2014 13:18    Microbiology: Recent Results (from the past 240 hour(s))  Urine culture     Status: None   Collection Time: 03/28/14  1:34 PM  Result Value Ref Range Status   Specimen Description URINE, CLEAN CATCH  Final   Special Requests NONE  Final    Colony Count   Final    3,000 COLONIES/ML Performed at [facility]    Culture   Final    INSIGNIFICANT GROWTH Performed at [facility]    Report Status 03/29/2014 FINAL  Final     Labs: Basic Metabolic Panel:  Recent Labs Lab 03/28/14 1030 03/28/14 2028 03/29/14 0138 03/30/14 0825  NA 138  --  135 136  K 4.0  --  3.8 4.2  CL 110  --  104 104  CO2 21  --  24 24  GLUCOSE 122*  --  126* 127*  BUN 26*  --  22 12  CREATININE 0.95 0.96 1.05 0.89  CALCIUM 8.5  --  8.1* 8.5   Liver Function Tests:  Recent Labs Lab 03/28/14 1030 03/29/14 0138  AST 27 24  ALT 21 18  ALKPHOS 49 46  BILITOT 1.6* 1.1  PROT 6.3 5.5*  ALBUMIN 3.9 3.6   No results for input(s): LIPASE, AMYLASE in the last 168 hours. No results for input(s): AMMONIA in the last 168 hours. CBC:  Recent Labs Lab 03/28/14 1030 03/28/14 2028 03/30/14 0825  WBC 5.0 5.4 6.0  HGB 15.3 14.5 14.7  HCT 46.4 45.1 45.1  MCV 91.2 92.2 91.3  PLT 141* 118* 103*   Cardiac Enzymes:  Recent Labs Lab 03/28/14 1030 03/28/14 2028 03/29/14 0138 03/29/14 0710  TROPONINI <0.03 <0.03 <0.03 <0.03   BNP: BNP (last 3 results) No results for input(s): PROBNP in the last 8760 hours. CBG:  Recent Labs Lab 03/28/14 1023  GLUCAP 116*       Signed:  Eliette Drumwright  [facility] 03/31/2014, 8:58 AM

## 2014-07-06 ENCOUNTER — Encounter: Payer: Self-pay | Admitting: Neurology

## 2014-07-06 ENCOUNTER — Ambulatory Visit (INDEPENDENT_AMBULATORY_CARE_PROVIDER_SITE_OTHER): Payer: Managed Care, Other (non HMO) | Admitting: Neurology

## 2014-07-06 VITALS — BP 170/98 | HR 68 | Resp 14 | Ht 75.0 in | Wt 234.8 lb

## 2014-07-06 DIAGNOSIS — G2581 Restless legs syndrome: Secondary | ICD-10-CM | POA: Diagnosis not present

## 2014-07-06 DIAGNOSIS — R55 Syncope and collapse: Secondary | ICD-10-CM | POA: Diagnosis not present

## 2014-07-06 DIAGNOSIS — I10 Essential (primary) hypertension: Secondary | ICD-10-CM

## 2014-07-06 DIAGNOSIS — G4700 Insomnia, unspecified: Secondary | ICD-10-CM

## 2014-07-06 DIAGNOSIS — R200 Anesthesia of skin: Secondary | ICD-10-CM | POA: Diagnosis not present

## 2014-07-06 NOTE — Progress Notes (Signed)
[facility]  PATIENT: Andrew Stevenson DOB: 07/31/1945  REFERRING DOCTOR OR PCP:  Juanita Craver, MD SOURCE: patient and records form [place]  _________________________________   HISTORICAL  CHIEF COMPLAINT:  Chief Complaint  Patient presents with  . Dizziness    Sts. in June 2015 he got up during the night to get a drink of water--sts. he remembers turning away from the sink--syncopal episode and the next thing he remembered was his wife standing over him--he hit his forehead on the sink when he fell, had a concussion.  Sts. the next day he was seen at [facility]. and had neg. cardiac/stroke w/u.  Sts. he  has had 3-4 other syncopal episodes since then.  Sts. prior to each episode he is dizzy, nauseous, and before 3 episodes has had intense anal pain.  . Loss of Consciousness    Dizziness is worse with standing but none of the syncopal episodes has been with position changes/fim    HISTORY OF PRESENT ILLNESS:  I had the pleasure of seeing your patient, Andrew Stevenson, at [facility] for neurologic consultation regarding his dizziness. As you know, he is a 69 year old man who began to experience syncope x 1 year.    The first episode was after walking to the bathroom (feeling normal) getting a drink, turning around and then feeling lightheaded and nauseous (no vomiting).  Then he lost consciousness for seconds only and fell.   He was sweating profusely afterwards.     He has had 5 episodes over the last year.    Two of the episodes were accompanied by intense anal/rectal pain before the episode and one the pain occurred afterwards.    He has had the same pain before (while siting and standing) without syncope.   The sensation will last 2 -5 minutes with severe pain coming in waves.   This occurs rarely maybe only 6 - 8 times in his life, most in the past year.       After the one spell, he had much more lumbar spine pain and was  hospitalized   Once spell occurred going down stairs.    He sometimes notes he gets light-headed when he stands up.     None of the spells seemed to be related to his occasional ropinirole pills for RLS  No spell was associated with tonic clonic activity, tongue biting or incontinence.     I personally reviewed the MRI of the brain dated 08/01/2009. The brain is essentially normal for age. The vertebral arteries appeared normal. The basilar artery was low normal in caliber.  In January 2016 he was admitted to the hospital and had a full cardiac workup for dizziness that was reportedly normal.   He was monitored with continuous EKG x 3 days in the hospital, had an Echo and some other tests.    He has noted tingling in his feet since 2010 or 2011 that has progressed.   As he has had LBP with radiculopathy and several surgical procedures, he had felt the symptoms may have been related.   He also has had restless leg syndrome x several years. several years and takes prn ropinirole.    He also has insomnia and attention deficit disorder.   He takes trazodone 2 -3 times a week with benefit.   Stimulants work well.  He has had elevated BP off/on the past year.  Currently 170/98.  REVIEW OF SYSTEMS: Constitutional: No fevers, chills, sweats, or change in appetite Eyes: No visual changes, double vision, eye pain Ear, nose and throat: No hearing loss, ear pain, nasal congestion, sore throat Cardiovascular: No chest pain, palpitations Respiratory: No shortness of breath at rest or with exertion.   No wheezes GastrointestinaI: No nausea, vomiting, diarrhea, abdominal pain, fecal incontinence Genitourinary: No dysuria, urinary retention or frequency.  No nocturia. Musculoskeletal: No neck pain, back pain Integumentary: No rash, pruritus, skin lesions Neurological: as above Psychiatric: No depression at this time.  No anxiety Endocrine: No palpitations, diaphoresis, change in appetite, change  in weigh or increased thirst Hematologic/Lymphatic: No anemia, purpura, petechiae. Allergic/Immunologic: No itchy/runny eyes, nasal congestion, recent allergic reactions, rashes  ALLERGIES: Allergies  Allergen Reactions  . Penicillins     unknown    HOME MEDICATIONS:  Current outpatient prescriptions:  .  amphetamine-dextroamphetamine (ADDERALL XR) 30 MG 24 hr capsule, Take 60 mg by mouth daily., Disp: , Rfl:  .  traZODone (DESYREL) 50 MG tablet, Take 50 mg by mouth at bedtime., Disp: , Rfl:   PAST MEDICAL HISTORY: Past Medical History  Diagnosis Date  . Chronic back pain   . Arthritis   . Personal history of other mental disorder     ADD  . Hypertension   . Myalgia and myositis, unspecified     PAST SURGICAL HISTORY: Past Surgical History  Procedure Laterality Date  . Neck surgery      x 2  . Back surgery      x 2  . Eye surgery      5 right eye, 3 left eye surgeries    FAMILY HISTORY: Family History  Problem Relation Age of Onset  . Depression Mother   . Diabetes Brother     SOCIAL HISTORY:  History   Social History  . Marital Status: Married    Spouse Name: N/A  . Number of Children: N/A  . Years of Education: N/A   Occupational History  . UNEMPLOYED    Social History Main Topics  . Smoking status: Never Smoker   . Smokeless tobacco: Current User    Types: Chew  . Alcohol Use: No  . Drug Use: No  . Sexual Activity: Not on file   Other Topics Concern  . Not on file   Social History Narrative     PHYSICAL EXAM  Filed Vitals:   07/06/14 1018  BP: 170/98  Pulse: 68  Resp: 14  Height: 6' 3"  (1.905 m)  Weight: 234 lb 12.8 oz (106.505 kg)   BP rechecked at 1040 am is 175/100  Body mass index is 29.35 kg/(m^2).   General: The patient is well-developed and well-nourished and in no acute distress  Neck: The neck is supple, no carotid bruits are noted.  The neck is nontender.  Cardiovascular: The heart has a regular rate and rhythm  with a normal S1 and S2. There were no murmurs, gallops or rubs. Lungs are clear to auscultation.  Skin: Extremities are without significant edema.  Musculoskeletal:  Back is nontender  Neurologic Exam  Mental status: The patient is alert and oriented x 3 at the time of the examination. The patient has apparent normal recent and remote memory, with an apparently normal attention span and concentration ability.   Speech is normal.  Cranial nerves: Extraocular movements are full. Pupils are equal, round, and reactive to light and accomodation.   Facial symmetry is present. There is good facial sensation to soft touch  bilaterally.Facial strength is normal.  Trapezius and sternocleidomastoid strength is normal. No dysarthria is noted.  The tongue is midline, and the patient has symmetric elevation of the soft palate. No obvious hearing deficits are noted.  Motor:  Muscle bulk is normal.   Tone is normal. Strength is  5 / 5 in all 4 extremities.   Sensory: Sensory testing is intact to pinprick, soft touch and vibration sensation in the arms but he has reduced touch, pinprick and vibration sensation in the toes and decreased vibration sensation at the ankle.  Coordination: Cerebellar testing reveals good finger-nose-finger and heel-to-shin bilaterally.  Gait and station: Station is normal.   Gait is normal. Tandem gait is normal. Romberg is negative.   Reflexes: Deep tendon reflexes are symmetric in the arms and knees and absent at the ankles.   Plantar responses are flexor.    DIAGNOSTIC DATA (LABS, IMAGING, TESTING) - I reviewed patient records, labs, notes, testing and imaging myself where available.  Lab Results  Component Value Date   WBC 6.0 03/30/2014   HGB 14.7 03/30/2014   HCT 45.1 03/30/2014   MCV 91.3 03/30/2014   PLT 103* 03/30/2014      Component Value Date/Time   NA 136 03/30/2014 0825   K 4.2 03/30/2014 0825   CL 104 03/30/2014 0825   CO2 24 03/30/2014 0825   GLUCOSE  127* 03/30/2014 0825   BUN 12 03/30/2014 0825   CREATININE 0.89 03/30/2014 0825   CALCIUM 8.5 03/30/2014 0825   PROT 5.5* 03/29/2014 0138   ALBUMIN 3.6 03/29/2014 0138   AST 24 03/29/2014 0138   ALT 18 03/29/2014 0138   ALKPHOS 46 03/29/2014 0138   BILITOT 1.1 03/29/2014 0138   GFRNONAA 85* 03/30/2014 0825   GFRAA >90 03/30/2014 0825   No results found for: CHOL, HDL, LDLCALC, LDLDIRECT, TRIG, CHOLHDL No results found for: HGBA1C Lab Results  Component Value Date   VITAMINB12 345 03/28/2014   Lab Results  Component Value Date   TSH 2.360 03/28/2014       ASSESSMENT AND PLAN  Syncope and collapse - Plan: Sedimentation Rate, Protein Electrophoresis, Serum, Immunofixation Electrophoresis, Serum, Vitamin B12, NCV with EMG(electromyography), US Carotid Bilateral, [place] [employer]  Essential hypertension  Insomnia  Numbness - Plan: Sedimentation Rate, Protein Electrophoresis, Serum, Immunofixation Electrophoresis, Serum, Vitamin B12, NCV with EMG(electromyography)  Restless leg syndrome   In summary, Mr. Preast is a 69 year old man who has had multiple episodes of syncope.   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   The age of onset is usually much younger but his description would be consistent. He spells persist we may want to try prophylactic night time Valium as there is some evidence for that treatment.     His examination is notable for numbness in the feet consistent with a polyneuropathy. Today, he did not have orthostatic hypotension though that is still a possible course of his symptoms. We will check ESR, SPEP, IEF, B12 and NCV/EMG to better characterize the probable neuropathy and to determine if there are treatable causes. The MRI of the brain performed in the past shows that the basilar artery is a smaller caliber than normal. We will check a transcranial Doppler and carotid ultrasound to  further assess this. Based on results we may also need to obtain MRA or CTA to better define the extent of stenosis.   His restless leg syndrome and insomnia appears to  be partially controlled at this point. Ice to continue treatment. If the restless legs worsen we will consider adding gabapentin.  He will return to see me for the NCV/EMG and call sooner if he has new or worsening neurologic symptoms.     Shimika Ames A. Felecia Shelling, MD, PhD 09/14/7791, 90:30 AM Certified in Neurology, Clinical Neurophysiology, Sleep Medicine, Pain Medicine and Neuroimaging  [facility] [address], [place], [place] [address] [phone]

## 2014-07-10 LAB — PROTEIN ELECTROPHORESIS, SERUM
A/G Ratio: 1.4 (ref 0.7–2.0)
Albumin ELP: 3.7 g/dL (ref 3.2–5.6)
Alpha 1: 0.2 g/dL (ref 0.1–0.4)
Alpha 2: 0.8 g/dL (ref 0.4–1.2)
Beta: 0.9 g/dL (ref 0.6–1.3)
Gamma Globulin: 0.8 g/dL (ref 0.5–1.6)
Globulin, Total: 2.7 g/dL (ref 2.0–4.5)

## 2014-07-10 LAB — IMMUNOFIXATION ELECTROPHORESIS
IgA/Immunoglobulin A, Serum: 155 mg/dL (ref 61–437)
IgG (Immunoglobin G), Serum: 680 mg/dL — ABNORMAL LOW (ref 700–1600)
IgM (Immunoglobulin M), Srm: 44 mg/dL (ref 20–172)
Total Protein: 6.4 g/dL (ref 6.0–8.5)

## 2014-07-10 LAB — VITAMIN B12: Vitamin B-12: 382 pg/mL (ref 211–946)

## 2014-07-10 LAB — SEDIMENTATION RATE: Sed Rate: 2 mm/hr (ref 0–30)

## 2014-07-11 ENCOUNTER — Telehealth: Payer: Self-pay | Admitting: Radiology

## 2014-07-11 NOTE — Telephone Encounter (Signed)
Called cell and left v/ message to call back for scheduling Carotid and TCD Complete.

## 2014-07-13 ENCOUNTER — Telehealth: Payer: Self-pay | Admitting: *Deleted

## 2014-07-13 NOTE — Telephone Encounter (Signed)
LMOM (identified vm) that labwork was normal.  He does not need to return this call unless he has a question or concern/fim

## 2014-07-13 NOTE — Telephone Encounter (Signed)
-----   Message from Britt Bottom, MD sent at 07/12/2014  4:30 PM EDT ----- Please elt him know bloodwork was normal

## 2014-07-23 ENCOUNTER — Ambulatory Visit (INDEPENDENT_AMBULATORY_CARE_PROVIDER_SITE_OTHER): Payer: Self-pay | Admitting: Neurology

## 2014-07-23 ENCOUNTER — Ambulatory Visit (INDEPENDENT_AMBULATORY_CARE_PROVIDER_SITE_OTHER): Payer: Managed Care, Other (non HMO) | Admitting: Neurology

## 2014-07-23 DIAGNOSIS — G609 Hereditary and idiopathic neuropathy, unspecified: Secondary | ICD-10-CM

## 2014-07-23 DIAGNOSIS — Z0289 Encounter for other administrative examinations: Secondary | ICD-10-CM

## 2014-07-23 DIAGNOSIS — R55 Syncope and collapse: Secondary | ICD-10-CM

## 2014-07-23 DIAGNOSIS — R200 Anesthesia of skin: Secondary | ICD-10-CM | POA: Diagnosis not present

## 2014-07-23 NOTE — Progress Notes (Signed)
  [facility]    Provider:  Dr Jaynee Eagles Referring Provider: Stephens Shire, MD Primary Care Physician:  Stephens Shire, MD  CC:  Peripheral neuropathy  HPI:  Andrew Stevenson is a 69 y.o. male here as a referral from Dr. Tollie Pizza for evaluation of peripheral neuropathy  He has numbness and tingling in both feet. Worse at night. He has low back pain with radiation into the right leg which travels down the back of the leg to the bottom of the foot. No left-sided radicular symptoms. No cramping.   Nerve conduction studies were performed on the bilateral lower extremities: Bilateral peroneal motor studies were within normal limits Bilateral tibial motor studies were within normal limits Bilateral sural sensory nerves were within normal limits Bilateral H reflexes showed normal latencies  EMG Needle study was performed on selected right lower extremity muscles:   The Medial Gastrocnemius showed mildly increased amplitude, prolonged duration and diminished recruitment.  The Vastus Medialis, Anterior Tibialis, Extensor Hallucis Longus, Abductor Hallucis, Peroneus Longus, Biceps Femoris(long head), Gluteus medius and Maximus muscles were within normal limits. Could not perform EMG needle exam on lumbar paraspinals as those are unreliable after surgery.    Conclusion:  1. This is essential a normal exam. 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 This is difficult to localize in isolation. There is no evidence of denervation to suggest acute/ongoing radiculopathy.  2. There is no evidence of polyneuropathy to explain the numbness and tingling in his feet, although a small fiber neuropathy could be responsible for such symptoms and still evade detection by NCS.  Sarina Ill, MD  [facility] [address] [place], [place] [address]  Phone [phone] Fax [phone]

## 2014-07-23 NOTE — Progress Notes (Signed)
See procedure note.

## 2014-07-26 NOTE — Procedures (Signed)
[facility]    Provider:  Dr Jaynee Eagles Referring Provider: Stephens Shire, MD Primary Care Physician:  Stephens Shire, MD  CC:  Peripheral neuropathy  HPI:  Andrew Stevenson is a 69 y.o. male here as a referral from Dr. Tollie Pizza for evaluation of peripheral neuropathy  He has numbness and tingling in both feet. Worse at night. He has low back pain with radiation into the right leg which travels down the back of the leg to the bottom of the foot. No left-sided radicular symptoms. No cramping.   Nerve conduction studies were performed on the bilateral lower extremities: Bilateral peroneal motor studies were within normal limits Bilateral tibial motor studies were within normal limits Bilateral sural sensory nerves were within normal limits Bilateral H reflexes showed normal latencies  EMG Needle study was performed on selected right lower extremity muscles:   The Medial Gastrocnemius showed mildly increased amplitude, prolonged duration and diminished recruitment.  The Vastus Medialis, Anterior Tibialis, Extensor Hallucis Longus, Abductor Hallucis, Peroneus Longus, Biceps Femoris(long head), Gluteus medius and Maximus muscles were within normal limits. Could not perform EMG needle exam on lumbar paraspinals as those are unreliable after surgery.    Conclusion:  1. This is essential a normal exam. 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 This is difficult to localize in isolation. There is no evidence of denervation to suggest acute/ongoing radiculopathy.  2. There is no evidence of polyneuropathy to explain the numbness and tingling in his feet, although a small fiber neuropathy could be responsible for such symptoms and still evade detection by this study.  Sarina Ill, MD  [facility] [address] [place], [place] [address]  Phone [phone] Fax [phone]

## 2014-07-31 ENCOUNTER — Telehealth: Payer: Self-pay

## 2014-07-31 ENCOUNTER — Other Ambulatory Visit: Payer: Self-pay | Admitting: Neurology

## 2014-07-31 DIAGNOSIS — R55 Syncope and collapse: Secondary | ICD-10-CM

## 2014-07-31 NOTE — Telephone Encounter (Signed)
-----   Message from Britt Bottom, MD sent at 07/30/2014  6:56 PM EDT ----- Please let him know the NCV/EMG did not show any significant polyneuropathy

## 2014-07-31 NOTE — Telephone Encounter (Signed)
I have spoken with Andrew Stevenson this afternoon and per RAS, advsied that nvs/emg did not show any sig. neuropathy.  Andrew Stevenson verbalized understanding of same/fim

## 2014-07-31 NOTE — Telephone Encounter (Signed)
VM left for patient to call office back to receive NCV/EMG results, which did not show any significant polyneuropathy.

## 2014-08-01 ENCOUNTER — Ambulatory Visit (INDEPENDENT_AMBULATORY_CARE_PROVIDER_SITE_OTHER): Payer: Managed Care, Other (non HMO)

## 2014-08-01 DIAGNOSIS — R55 Syncope and collapse: Secondary | ICD-10-CM

## 2014-08-01 DIAGNOSIS — Z0289 Encounter for other administrative examinations: Secondary | ICD-10-CM

## 2014-08-01 HISTORY — PX: OTHER SURGICAL HISTORY: SHX169

## 2014-08-10 ENCOUNTER — Telehealth: Payer: Self-pay | Admitting: Neurology

## 2014-08-10 DIAGNOSIS — I10 Essential (primary) hypertension: Secondary | ICD-10-CM

## 2014-08-10 DIAGNOSIS — R55 Syncope and collapse: Secondary | ICD-10-CM

## 2014-08-10 NOTE — Telephone Encounter (Signed)
Pt called and stated that he was supposed to have a scan of the brain. Checked referrals and chart and did not see anything. Please call and advise.

## 2014-08-23 NOTE — Telephone Encounter (Signed)
Please let the patient know the Carotid Dopplers were normal...  If spells are continuing, lets check MRA head

## 2014-08-24 NOTE — Telephone Encounter (Signed)
LMOM for Andrew Stevenson to call.  MRA has been ordered/fim

## 2014-08-27 NOTE — Telephone Encounter (Signed)
I have spoken with Andrew Stevenson this afternoon and advised that mra has been ordered; he should expect a call to schedule it once insurance has approved it.  He verbalized understanding if same/fim

## 2014-09-03 ENCOUNTER — Telehealth: Payer: Self-pay | Admitting: *Deleted

## 2014-09-03 NOTE — Telephone Encounter (Signed)
I have spoken with Andrew Stevenson this afternoon and per RAS, advised that insurance will not cover mra, and since Andrew Stevenson reports no further episodes, this is ok not to proceed with mra at this time, but RAS would like him to start Asa 81mg  daily.  Andrew Stevenson verbalized understanding of same, will call for f/u appt. if he has another episode/fim

## 2014-09-03 NOTE — Telephone Encounter (Signed)
-----   Message from Blenda Peals sent at 09/03/2014 10:03 AM EDT ----- Regarding: FW: MRA denial Please see below. Please advise patient on how to proceed with his treatment or follow up.  Thanks!   ----- Message -----    From: Britt Bottom, MD    Sent: 08/31/2014   3:48 PM      To: Blenda Peals Subject: RE: MRA denial                                 Please let him know that insurance will not cover the [place]  ----- Message -----    From: Blenda Peals    Sent: 08/30/2014   2:44 PM      To: Britt Bottom, MD Subject: MRA denial                                     MRA Head denied case # [id number]. Call [phone].  Please advise auth or how or how to proceed.

## 2014-10-19 ENCOUNTER — Other Ambulatory Visit: Payer: Self-pay | Admitting: Urology

## 2014-11-05 ENCOUNTER — Encounter (HOSPITAL_BASED_OUTPATIENT_CLINIC_OR_DEPARTMENT_OTHER): Payer: Self-pay | Admitting: *Deleted

## 2014-11-05 NOTE — Progress Notes (Signed)
NPO AFTER MN.  PT VERBALIZED UNDERSTANDING THIS INCLUDES NO DIP TOBACCO.  ARRIVE AT 0600.  NEEDS ISTAT.  CURRENT EKG IN CHART AND EPIC.  WILL DO FLEET ENEMA AM DOS .

## 2014-11-05 NOTE — Progress Notes (Signed)
   11/05/14 1446  OBSTRUCTIVE SLEEP APNEA  Have you ever been diagnosed with sleep apnea through a sleep study? No  Do you snore loudly (loud enough to be heard through closed doors)?  1  Do you often feel tired, fatigued, or sleepy during the daytime? 0  Has anyone observed you stop breathing during your sleep? 0  Do you have, or are you being treated for high blood pressure? 1  BMI more than 35 kg/m2? 0  Age over 70 years old? 1  Neck circumference greater than 40 cm/16 inches? 1  Gender: 1  Obstructive Sleep Apnea Score 5

## 2014-11-11 NOTE — Anesthesia Preprocedure Evaluation (Addendum)
Anesthesia Evaluation  Patient identified by MRN, date of birth, ID band Patient awake    Reviewed: Allergy & Precautions, NPO status , Patient's Chart, lab work & pertinent test results  History of Anesthesia Complications Negative for: history of anesthetic complications  Airway Mallampati: II  TM Distance: >3 FB Neck ROM: Full    Dental  (+) Upper Dentures, Lower Dentures, Dental Advisory Given   Pulmonary neg pulmonary ROS,    Pulmonary exam normal       Cardiovascular hypertension, Normal cardiovascular exam Study Conclusions  - Left ventricle: The cavity size was normal. There was mild concentric hypertrophy. Systolic function was normal. The estimated ejection fraction was in the range of 55% to 60%. Wall motion was normal; there were no regional wall motion abnormalities. Doppler parameters are consistent with abnormal left ventricular relaxation (grade 1 diastolic dysfunction). - Aortic valve: Possibly bicuspid. There was mild stenosis. Valve area (VTI): 1.87 cm^2. Valve area (Vmax): 1.66 cm^2. Valve area (Vmean): 1.79 cm^2.     Neuro/Psych PSYCHIATRIC DISORDERS negative neurological ROS     GI/Hepatic negative GI ROS, Neg liver ROS,   Endo/Other    Renal/GU negative Renal ROS     Musculoskeletal   Abdominal   Peds  Hematology   Anesthesia Other Findings   Reproductive/Obstetrics                          Anesthesia Physical Anesthesia Plan  ASA: II  Anesthesia Plan: General   Post-op Pain Management:    Induction: Intravenous  Airway Management Planned: LMA  Additional Equipment:   Intra-op Plan:   Post-operative Plan: Extubation in OR  Informed Consent: I have reviewed the patients History and Physical, chart, labs and discussed the procedure including the risks, benefits and alternatives for the proposed anesthesia with the patient or authorized  representative who has indicated his/her understanding and acceptance.   Dental advisory given  Plan Discussed with: CRNA, Anesthesiologist and Surgeon  Anesthesia Plan Comments: (Pt states he cannot remove dentures.  Accepts risk of damage to dentures with airway protection)       Anesthesia Quick Evaluation

## 2014-11-12 ENCOUNTER — Ambulatory Visit (HOSPITAL_BASED_OUTPATIENT_CLINIC_OR_DEPARTMENT_OTHER)
Admission: RE | Admit: 2014-11-12 | Discharge: 2014-11-12 | Disposition: A | Discharge status: Home / Self Care | Payer: Managed Care, Other (non HMO) | Source: Ambulatory Visit | Attending: Urology | Admitting: Urology

## 2014-11-12 ENCOUNTER — Encounter (HOSPITAL_BASED_OUTPATIENT_CLINIC_OR_DEPARTMENT_OTHER): Payer: Self-pay

## 2014-11-12 ENCOUNTER — Ambulatory Visit (HOSPITAL_BASED_OUTPATIENT_CLINIC_OR_DEPARTMENT_OTHER): Payer: Managed Care, Other (non HMO) | Admitting: Anesthesiology

## 2014-11-12 ENCOUNTER — Encounter (HOSPITAL_BASED_OUTPATIENT_CLINIC_OR_DEPARTMENT_OTHER)
Admission: RE | Disposition: A | Discharge status: Home / Self Care | Payer: Self-pay | Source: Ambulatory Visit | Attending: Urology

## 2014-11-12 DIAGNOSIS — R972 Elevated prostate specific antigen [PSA]: Secondary | ICD-10-CM | POA: Diagnosis present

## 2014-11-12 DIAGNOSIS — I10 Essential (primary) hypertension: Secondary | ICD-10-CM | POA: Insufficient documentation

## 2014-11-12 DIAGNOSIS — N423 Dysplasia of prostate: Secondary | ICD-10-CM | POA: Insufficient documentation

## 2014-11-12 DIAGNOSIS — Z87442 Personal history of urinary calculi: Secondary | ICD-10-CM | POA: Insufficient documentation

## 2014-11-12 DIAGNOSIS — N411 Chronic prostatitis: Secondary | ICD-10-CM | POA: Diagnosis not present

## 2014-11-12 DIAGNOSIS — C61 Malignant neoplasm of prostate: Secondary | ICD-10-CM | POA: Insufficient documentation

## 2014-11-12 HISTORY — PX: TRANSRECTAL ULTRASOUND: SHX5146

## 2014-11-12 HISTORY — DX: Other specified personal risk factors, not elsewhere classified: Z91.89

## 2014-11-12 HISTORY — DX: Personal history of other specified conditions: Z87.898

## 2014-11-12 HISTORY — DX: Personal history of traumatic brain injury: Z87.820

## 2014-11-12 HISTORY — DX: Nonrheumatic aortic (valve) stenosis: I35.0

## 2014-11-12 HISTORY — DX: Presence of spectacles and contact lenses: Z97.3

## 2014-11-12 LAB — POCT I-STAT 4, (NA,K, GLUC, HGB,HCT)
Glucose, Bld: 109 mg/dL — ABNORMAL HIGH (ref 65–99)
HCT: 47 % (ref 39.0–52.0)
Hemoglobin: 16 g/dL (ref 13.0–17.0)
Potassium: 4.3 mmol/L (ref 3.5–5.1)
Sodium: 139 mmol/L (ref 135–145)

## 2014-11-12 SURGERY — ULTRASOUND, RECTAL APPROACH
Anesthesia: General | Site: Prostate

## 2014-11-12 MED ORDER — MIDAZOLAM HCL 2 MG/2ML IJ SOLN
INTRAMUSCULAR | Status: AC
  Filled 2014-11-12: qty 2

## 2014-11-12 MED ORDER — PROMETHAZINE HCL 25 MG/ML IJ SOLN
6.2500 mg | INTRAMUSCULAR | Status: DC | PRN
Start: 2014-11-12 — End: 2014-11-12
  Filled 2014-11-12: qty 1

## 2014-11-12 MED ORDER — FENTANYL CITRATE (PF) 100 MCG/2ML IJ SOLN
INTRAMUSCULAR | Status: DC | PRN
  Administered 2014-11-12 (×2): 50 ug via INTRAVENOUS

## 2014-11-12 MED ORDER — HYDROMORPHONE HCL 1 MG/ML IJ SOLN
0.2500 mg | INTRAMUSCULAR | Status: DC | PRN
  Filled 2014-11-12: qty 1

## 2014-11-12 MED ORDER — CEFTRIAXONE SODIUM 1 G IJ SOLR
INTRAMUSCULAR | Status: AC
  Filled 2014-11-12: qty 10

## 2014-11-12 MED ORDER — DEXTROSE 5 % IV SOLN
1.0000 g | Freq: Once | INTRAVENOUS | Status: AC
  Administered 2014-11-12: 2 g via INTRAVENOUS
  Filled 2014-11-12: qty 10

## 2014-11-12 MED ORDER — DEXAMETHASONE SODIUM PHOSPHATE 4 MG/ML IJ SOLN
INTRAMUSCULAR | Status: DC | PRN
  Administered 2014-11-12: 10 mg via INTRAVENOUS

## 2014-11-12 MED ORDER — FENTANYL CITRATE (PF) 100 MCG/2ML IJ SOLN
INTRAMUSCULAR | Status: AC
  Filled 2014-11-12: qty 4

## 2014-11-12 MED ORDER — PROPOFOL 10 MG/ML IV BOLUS
INTRAVENOUS | Status: DC | PRN
  Administered 2014-11-12: 200 mg via INTRAVENOUS

## 2014-11-12 MED ORDER — LACTATED RINGERS IV SOLN
INTRAVENOUS | Status: DC
  Administered 2014-11-12: 07:00:00 via INTRAVENOUS
  Filled 2014-11-12: qty 1000

## 2014-11-12 MED ORDER — LIDOCAINE HCL (CARDIAC) 20 MG/ML IV SOLN
INTRAVENOUS | Status: DC | PRN
  Administered 2014-11-12: 100 mg via INTRAVENOUS

## 2014-11-12 MED ORDER — MIDAZOLAM HCL 5 MG/5ML IJ SOLN
INTRAMUSCULAR | Status: DC | PRN
  Administered 2014-11-12: 1 mg via INTRAVENOUS

## 2014-11-12 MED ORDER — ONDANSETRON HCL 4 MG/2ML IJ SOLN
INTRAMUSCULAR | Status: DC | PRN
  Administered 2014-11-12: 4 mg via INTRAVENOUS

## 2014-11-12 SURGICAL SUPPLY — 9 items
GLOVE BIO SURGEON STRL SZ8 (GLOVE) IMPLANT
MANIFOLD NEPTUNE II (INSTRUMENTS) IMPLANT
NDL SAFETY ECLIPSE 18X1.5 (NEEDLE) ×1 IMPLANT
NDL SPNL 22GX7 QUINCKE BK (NEEDLE) ×1 IMPLANT
NEEDLE HYPO 18GX1.5 SHARP (NEEDLE)
NEEDLE SPNL 22GX7 QUINCKE BK (NEEDLE) IMPLANT
SURGILUBE 2OZ TUBE FLIPTOP (MISCELLANEOUS) ×2 IMPLANT
SYR CONTROL 10ML LL (SYRINGE) IMPLANT
UNDERPAD 30X30 INCONTINENT (UNDERPADS AND DIAPERS) ×3 IMPLANT

## 2014-11-12 NOTE — Anesthesia Procedure Notes (Signed)
Procedure Name: LMA Insertion Date/Time: 11/12/2014 7:31 AM Performed by: Mechele Claude Pre-anesthesia Checklist: Patient identified, Emergency Drugs available, Suction available and Patient being monitored Patient Re-evaluated:Patient Re-evaluated prior to inductionOxygen Delivery Method: Circle System Utilized Preoxygenation: Pre-oxygenation with 100% oxygen Intubation Type: IV induction Ventilation: Mask ventilation without difficulty LMA: LMA inserted LMA Size: 5.0 Number of attempts: 1 Airway Equipment and Method: bite block Placement Confirmation: positive ETCO2 Tube secured with: Tape Dental Injury: Teeth and Oropharynx as per pre-operative assessment

## 2014-11-12 NOTE — Op Note (Signed)
PATIENT:  Andrew Stevenson  PRE-OPERATIVE DIAGNOSIS: Elevated PSA  POST-OPERATIVE DIAGNOSIS: Same  PROCEDURE: TRUS/BX  SURGEON:  Claybon Jabs  INDICATION: Andrew Stevenson is a 69 year old male who was found to have an elevated PSA. He also was noted to have culture proven Escherichia coli UTI which was treated with antibiotics and the PSA was repeated but remained elevated. He therefore is brought to the operating room for TRUS/BX as he would not tolerate this procedure while awake.  ANESTHESIA:  General  EBL:  Minimal  DRAINS: None  LOCAL MEDICATIONS USED:  None  SPECIMEN:  Prostate cores to pathology  Description of procedure: After informed consent the patient was taken to the operating room and placed on the table in a supine position. General anesthesia was then administered. Once fully anesthetized the patient was moved to the lateral decubitus position. An official timeout was then performed.  Procedure: Transrectal ultrasound of the prostate, Ultrasound Guided Prostate needle biopsy.  Indication: Elevated PSA.  Consent: Risks, benefits, and potential adverse events were discussed and informed consent was obtained from the patient.  Prep: Fleets enema.  Local Anesthesia: None Antibiotic prophylaxis: Levofloxacin and ceftriaxone 1 g  The patient was placed in the lateral decubitus position. The prostate size was estimated to be 1+ by digital rectal exam. The 10 MHz transrectal ultrasound probe was placed into the rectum. Prostate width measured 5.18cm, height of 3.58cm and length of 4.76cm . Prostate volume was measured to be 46.19 cc. Hypoechoic lesions were noted (right apex extending to the mid prostate laterally ) . The seminal vesicles appeared normal. A median lobe was present. The biopsy cores were obtained using direct, real-time ultrasound guidance utilizing a standard 12-core pattern with one core from the right apex lateral using real time ultrasound guidance to direct  the biopsy core being taken from this location, right apex medial using real time ultrasound guidance to direct the biopsy core being taken from this location, left apex lateral using real time ultrasound guidance to direct the biopsy core being taken from this location, left apex medial using real time ultrasound guidance to direct the biopsy core being taken from this location, right mid lateral using real time ultrasound guidance to direct the biopsy core being taken from this location, right mid medial using real time ultrasound guidance to direct the biopsy core being taken from this location, left mid lateral using real time ultrasound guidance to direct the biopsy core being taken from this location, left mid medial using real time ultrasound guidance to direct the biopsy core being taken from this location, right base lateral using real time ultrasound guidance to direct the biopsy core being taken from this location, right base medial using real time ultrasound guidance to direct the biopsy core being taken from this location, left base lateral using real time ultrasound guidance to direct the biopsy core being taken from this location and left base medial of the prostate using real time ultrasound guidance to direct the biopsy core being taken from this location. The biopsy cores were placed in buffered formalin and sent to pathlogy.  Patient Status:. The patient tolerated the procedure well.  Complications:. None.  PLAN OF CARE: Discharge to home after PACU  PATIENT DISPOSITION:  PACU - hemodynamically stable.

## 2014-11-12 NOTE — Discharge Instructions (Signed)
DISCHARGE INSTRUCTIONS FOR PROSTATE BIOPSY  Antibiotics You may be given a prescription for an antibiotic to take when you arrive home. If so, be sure to take every tablet in the bottle, even if you are feeling better before the prescription is finished. If you begin itching, notice a rash or start to swell on your trunk, arms, legs and/or throat, immediately stop taking the antibiotic and call your Urologist. Diet Resume your usual diet when you return home. To keep your bowels moving easily and softly, drink prune, apple and cranberry juice at room temperature. You may also take a stool softener, such as Colace, which is available without prescription at local pharmacies. Daily activities ? No driving or heavy lifting for at least two days after the implant. ? Any strenuous physical activity should be approved by your doctor before you resume it.  Contact your doctor for ? Temperature greater than 101 F ? Increasing pain ? Inability to urinate ?  Follow-up  You should have follow up with your urologist 1 week after the procedure.    Post Anesthesia Home Care Instructions  Activity: Get plenty of rest for the remainder of the day. A responsible adult should stay with you for 24 hours following the procedure.  For the next 24 hours, DO NOT: -Drive a car -Paediatric nurse -Drink alcoholic beverages -Take any medication unless instructed by your physician -Make any legal decisions or sign important papers.  Meals: Start with liquid foods such as gelatin or soup. Progress to regular foods as tolerated. Avoid greasy, spicy, heavy foods. If nausea and/or vomiting occur, drink only clear liquids until the nausea and/or vomiting subsides. Call your physician if vomiting continues.  Special Instructions/Symptoms: Your throat may feel dry or sore from the anesthesia or the breathing tube placed in your throat during surgery. If this causes discomfort, gargle with warm salt water. The  discomfort should disappear within 24 hours.  If you had a scopolamine patch placed behind your ear for the management of post- operative nausea and/or vomiting:  1. The medication in the patch is effective for 72 hours, after which it should be removed.  Wrap patch in a tissue and discard in the trash. Wash hands thoroughly with soap and water. 2. You may remove the patch earlier than 72 hours if you experience unpleasant side effects which may include dry mouth, dizziness or visual disturbances. 3. Avoid touching the patch. Wash your hands with soap and water after contact with the patch.
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
## 2014-11-12 NOTE — Transfer of Care (Signed)
Last Vitals:  Filed Vitals:   11/12/14 0607  BP: 148/90  Pulse: 64  Temp: 36.5 C  Resp: 16    Immediate Anesthesia Transfer of Care Note  Patient: Andrew Stevenson  Procedure(s) Performed: Procedure(s) (LRB): TRANSRECTAL ULTRASOUND AND BIOPSY   (N/A)  Patient Location: PACU  Anesthesia Type: General  Level of Consciousness: awake, alert  and oriented  Airway & Oxygen Therapy: Patient Spontanous Breathing and Patient connected to face mask oxygen  Post-op Assessment: Report given to PACU RN and Post -op Vital signs reviewed and stable  Post vital signs: Reviewed and stable  Complications: No apparent anesthesia complications

## 2014-11-12 NOTE — Anesthesia Postprocedure Evaluation (Signed)
Anesthesia Post Note  Patient: Andrew Stevenson  Procedure(s) Performed: Procedure(s) (LRB): TRANSRECTAL ULTRASOUND AND BIOPSY   (N/A)  Anesthesia type: general  Patient location: PACU  Post pain: Pain level controlled  Post assessment: Patient's Cardiovascular Status Stable  Last Vitals:  Filed Vitals:   11/12/14 0827  BP: 150/82  Pulse: 64  Temp:   Resp: 14    Post vital signs: Reviewed and stable  Level of consciousness: sedated  Complications: No apparent anesthesia complications

## 2014-11-13 ENCOUNTER — Encounter (HOSPITAL_BASED_OUTPATIENT_CLINIC_OR_DEPARTMENT_OTHER): Payer: Self-pay | Admitting: Urology

## 2014-12-03 ENCOUNTER — Ambulatory Visit
Admission: RE | Admit: 2014-12-03 | Discharge: 2014-12-03 | Disposition: A | Discharge status: Home / Self Care | Payer: Managed Care, Other (non HMO) | Source: Ambulatory Visit | Attending: Radiation Oncology | Admitting: Radiation Oncology

## 2014-12-03 ENCOUNTER — Encounter: Payer: Self-pay | Admitting: Radiation Oncology

## 2014-12-03 VITALS — BP 132/67 | HR 63 | Resp 16 | Ht 75.5 in | Wt 250.4 lb

## 2014-12-03 DIAGNOSIS — I10 Essential (primary) hypertension: Secondary | ICD-10-CM | POA: Diagnosis not present

## 2014-12-03 DIAGNOSIS — Z79899 Other long term (current) drug therapy: Secondary | ICD-10-CM | POA: Diagnosis not present

## 2014-12-03 DIAGNOSIS — Z880 Allergy status to penicillin: Secondary | ICD-10-CM | POA: Insufficient documentation

## 2014-12-03 DIAGNOSIS — Z833 Family history of diabetes mellitus: Secondary | ICD-10-CM | POA: Insufficient documentation

## 2014-12-03 DIAGNOSIS — C61 Malignant neoplasm of prostate: Secondary | ICD-10-CM | POA: Insufficient documentation

## 2014-12-03 NOTE — Progress Notes (Signed)
Radiation Oncology         [phone] ________________________________  Initial Outpatient Consultation  Name: Andrew Stevenson MRN: [id number]  Date: 12/03/2014  DOB: Dec 06, 1945  MH:[id number],LGXQJ A, MD  Kathie Rhodes, MD   REFERRING PHYSICIAN: Kathie Rhodes, MD  DIAGNOSIS: 69 y.o. gentleman with stage T1c adenocarcinoma of the prostate with a Gleason's score of 4+3 and a PSA of 5.26.    ICD-9-CM ICD-10-CM   1. Malignant neoplasm of prostate 185 C61     HISTORY OF PRESENT ILLNESS::Andrew Stevenson is a 69 y.o. gentleman.  He was noted to have an elevated PSA of 5.56 by his primary care physician, Dr. Tollie Pizza.  Accordingly, he was referred for evaluation in urology by Dr. Karsten Ro on 08/2014,  digital rectal examination was not performed at that time due to the patient's wishes.  The patient proceeded to transrectal ultrasound with 12 biopsies of the prostate on 11/12/14.  The prostate volume measured 46.19 cc.  Out of 12 core biopsies, 5 were positive.  The maximum Gleason score was 4+3, and this was seen in left side.  The patient reviewed the biopsy results with his urologist and he has kindly been referred today for discussion of potential radiation treatment options.  PREVIOUS RADIATION THERAPY: No  PAST MEDICAL HISTORY:  has a past medical history of Arthritis; Myalgia and myositis, unspecified; ADD (attention deficit disorder); History of syncope; Hypertension; History of concussion; Hyperplasia of prostate with lower urinary tract symptoms (LUTS); Elevated PSA; Wears glasses; Full dentures; At risk for sleep apnea; Mild aortic stenosis; and Prostate cancer.    PAST SURGICAL HISTORY: Past Surgical History  Procedure Laterality Date  . Left thigh quadricep muscle biopsy's  12-31-2005  . Eye surgery Bilateral 2013    5 right eye, 3 left eye surgeries (including cataract extraction's iol w/ lens implant, other surgery's for post-op lens fungal infection and floaters)  . Cervical fusion   x1  last one 2010  . Lumbar fusion  x2  last one 2010  . Transthoracic echocardiogram  03-29-2014    mild concentric LVH/  ef 19-41%/  grade I diastolic dysfunction/  AV poorly visualized, possibly bicupid;  mild AS,  valve area 1.78cm^2,  mean grandient 62mm Hg/  trivial TR  . Transrectal ultrasound N/A 11/12/2014    Procedure: TRANSRECTAL ULTRASOUND AND BIOPSY  ;  Surgeon: Kathie Rhodes, MD;  Location: [facility];  Service: Urology;  Laterality: N/A;    FAMILY HISTORY: family history includes Depression in his mother; Diabetes in his brother.  SOCIAL HISTORY:  reports that he has been smoking E-cigarettes.  His smokeless tobacco use includes Chew and Snuff. He reports that he drinks alcohol. He reports that he does not use illicit drugs.  ALLERGIES: Penicillins  MEDICATIONS:  Current Outpatient Prescriptions  Medication Sig Dispense Refill  . diazepam (VALIUM) 10 MG tablet Take 2 mg by mouth 3 (three) times daily as needed.   5  . losartan (COZAAR) 25 MG tablet Take 25 mg by mouth daily.     No current facility-administered medications for this encounter.    REVIEW OF SYSTEMS:  A 15 point review of systems is documented in the electronic medical record. This was obtained by the nursing staff. However, I reviewed this with the patient to discuss relevant findings and make appropriate changes.  Pertinent items are noted in HPI..  The patient completed an IPSS and IIEF questionnaire.  His IPSS score was 19 indicating moderate urinary outflow obstructive symptoms.  He indicated that his erectile function is able to complete sexual activity on most attempts.  The patient reports urinary frequency, urgency, milddysuria, nocturia x2, difficulty starting the urinary stream, intermittent urine stream, rare incontinence, and enuresis x1 a few months ago. The patient reports a history of syncope beginning 2 years ago with him passing out in January and ending up in the ED due to a  fall. He has started to pass out more frequently in the last month. He reports he would feel these episodes begin and would lie down until they pass. He also states he is constantly dizzy. He reports he does not lose control of his bladder or bowels when he passes out. The patient reports that Hydrocodone causes pruritus.    PHYSICAL EXAM: This patient is in no acute distress.  He is alert and oriented.   height is 6' 3.5" (1.918 m) and weight is 250 lb 6.4 oz (113.581 kg). His blood pressure is 132/67 and his pulse is 63. His respiration is 16.  He exhibits no respiratory distress or labored breathing.  He appears neurologically intact.  His mood is pleasant.  His affect is appropriate.  Please note the digital rectal exam findings described above. He presents to the clinic with a cane.  KPS = 100  100 - Normal; no complaints; no evidence of disease. 90   - Able to carry on normal activity; minor signs or symptoms of disease. 80   - Normal activity with effort; some signs or symptoms of disease. 31   - Cares for self; unable to carry on normal activity or to do active work. 60   - Requires occasional assistance, but is able to care for most of his personal needs. 50   - Requires considerable assistance and frequent medical care. 32   - Disabled; requires special care and assistance. 32   - Severely disabled; hospital admission is indicated although death not imminent. 89   - Very sick; hospital admission necessary; active supportive treatment necessary. 10   - Moribund; fatal processes progressing rapidly. 0     - Dead  Karnofsky DA, Abelmann Dacono, Craver LS and Burchenal [facility] [phone]) The use of the nitrogen mustards in the palliative treatment of carcinoma: with particular reference to bronchogenic carcinoma Cancer 1 634-56   LABORATORY DATA:  Lab Results  Component Value Date   WBC 6.0 03/30/2014   HGB 16.0 11/12/2014   HCT 47.0 11/12/2014   MCV 91.3 03/30/2014   PLT 103* 03/30/2014    Lab Results  Component Value Date   NA 139 11/12/2014   K 4.3 11/12/2014   CL 104 03/30/2014   CO2 24 03/30/2014   Lab Results  Component Value Date   ALT 18 03/29/2014   AST 24 03/29/2014   ALKPHOS 46 03/29/2014   BILITOT 1.1 03/29/2014     RADIOGRAPHY: No results found.    IMPRESSION: This gentleman is a 69 y.o. gentleman with stage T1c adenocarcinoma of the prostate with a Gleason's score of 4+3 and a PSA of 5.26.  His T-Stage, Gleason's Score, and PSA put him into the intermediate risk group.  Accordingly he is eligible for a variety of potential treatment options including 8 weeks of external radiation or 5 weeks of external radiation with a seed implant boost.  PLAN: Today I reviewed the findings and workup thus far.  We discussed the natural history of prostate cancer.  We reviewed the the implications of T-stage, Gleason's Score, and PSA on  decision-making and outcomes in prostate cancer.  We discussed radiation treatment in the management of prostate cancer with regard to the logistics and delivery of external beam radiation treatment as well as the logistics and delivery of prostate brachytherapy.  We compared and contrasted each of these approaches and also compared these against prostatectomy.  The patient expressed interest in external beam radiotherapy.  I filled out a patient counseling form for him with relevant treatment diagrams and we retained a copy for our records.   The patient would like to proceed with five weeks of external beam radiation with a seed implant boost. He would like to delay his treatment until he returns from his vacation in November. I provided the patient with the contact information of our scheduler, Enid Derry, and he was advised to contact her when he is ready to proceed with treatment. I will share my findings with Dr. Karsten Ro and move forward with scheduling placement of three gold fiducial markers into the prostate to proceed with external beam  radiation in the near future.  The patient and his wife will be on a vacation between November 11 to November 19. The patient has a cardiac evaluation pending as well.  I enjoyed meeting with him today, and will look forward to participating in the care of this very nice gentleman.  I spent time face to face with the patient and more than 50% of that time was spent in counseling and/or coordination of care.   This document serves as a record of services personally performed by Tyler Pita, MD. It was created on his behalf by Darcus Austin, a trained medical scribe. The creation of this record is based on the scribe's personal observations and the provider's statements to them. This document has been checked and approved by the attending provider.     ------------------------------------------------  Sheral Apley Tammi Klippel, M.D.
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
## 2014-12-03 NOTE — Progress Notes (Signed)
See progress note under physician encounter. 

## 2014-12-04 NOTE — Addendum Note (Signed)
Encounter addended by: Heywood Footman, RN on: 12/04/2014  3:12 PM<BR>     Documentation filed: Notes Section

## 2014-12-04 NOTE — Progress Notes (Signed)
Late entry from 12/03/2014. Patient reports low back pain resolved following cauterization. Reports he is scheduled to follow up with his cardiologist about dizziness and "passing out." Patient confirms he is constantly dizzy. Patient ambulates with the aid of a cane due to dizziness.Reports he fell in January 2010 then, again four times in the last two months. Patient has a vacation planned with his wife November 11-19 to celebrate their 50th wedding anniversary.

## 2014-12-05 ENCOUNTER — Telehealth: Payer: Self-pay | Admitting: *Deleted

## 2014-12-05 NOTE — Telephone Encounter (Signed)
Called patient to ask question, lvm for a return call 

## 2014-12-07 ENCOUNTER — Encounter: Payer: Self-pay | Admitting: *Deleted

## 2014-12-07 NOTE — Progress Notes (Signed)
Andrew Stevenson Psychosocial Distress Screening Clinical Social Work  Clinical Social Work was referred by distress screening protocol.  The patient scored a 7 on the Psychosocial Distress Thermometer which indicates severe distress. Clinical Social Worker phoned pt to assess for distress and other psychosocial needs. CSW had to leave message for pt.   ONCBCN DISTRESS SCREENING 12/03/2014  Screening Type Initial Screening  Distress experienced in past week (1-10) 7  Emotional problem type Depression;Adjusting to illness;Nervousness/Anxiety  Physical Problem type Pain;Loss of appetitie;Talking;Changes in urination  Physician notified of physical symptoms Yes  Referral to clinical social work Yes  Referral to dietition Yes    Clinical Social Worker follow up needed: Yes.    If yes, follow up plan: CSW awaits return call, no future appts scheduled.  Loren Racer, [place] Worker [place]  [place] Phone: [phone] Fax: [phone]

## 2014-12-13 ENCOUNTER — Ambulatory Visit: Payer: Managed Care, Other (non HMO) | Admitting: Radiation Oncology

## 2014-12-13 ENCOUNTER — Ambulatory Visit: Payer: Managed Care, Other (non HMO)

## 2014-12-15 HISTORY — PX: OTHER SURGICAL HISTORY: SHX169

## 2014-12-17 ENCOUNTER — Telehealth: Payer: Self-pay | Admitting: Neurology

## 2014-12-17 NOTE — Telephone Encounter (Signed)
I have spoken with Andrew Stevenson this morning, offered appt. with RAS this afternoon, but he is not able to make this.  Appt. given tomorrow/fim

## 2014-12-17 NOTE — Telephone Encounter (Signed)
Patient is calling back and states that his episodes are coming more frequently and he feels he needs the MRA or another test.  Please call.

## 2014-12-18 ENCOUNTER — Ambulatory Visit (INDEPENDENT_AMBULATORY_CARE_PROVIDER_SITE_OTHER): Payer: Managed Care, Other (non HMO) | Admitting: Neurology

## 2014-12-18 ENCOUNTER — Encounter: Payer: Self-pay | Admitting: Neurology

## 2014-12-18 VITALS — BP 140/78 | HR 56 | Resp 14 | Ht 75.5 in | Wt 251.0 lb

## 2014-12-18 DIAGNOSIS — G2581 Restless legs syndrome: Secondary | ICD-10-CM

## 2014-12-18 DIAGNOSIS — G450 Vertebro-basilar artery syndrome: Secondary | ICD-10-CM | POA: Insufficient documentation

## 2014-12-18 DIAGNOSIS — R200 Anesthesia of skin: Secondary | ICD-10-CM

## 2014-12-18 DIAGNOSIS — R55 Syncope and collapse: Secondary | ICD-10-CM

## 2014-12-18 DIAGNOSIS — M545 Low back pain, unspecified: Secondary | ICD-10-CM

## 2014-12-18 DIAGNOSIS — G459 Transient cerebral ischemic attack, unspecified: Secondary | ICD-10-CM | POA: Insufficient documentation

## 2014-12-18 DIAGNOSIS — R42 Dizziness and giddiness: Secondary | ICD-10-CM | POA: Diagnosis not present
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
## 2015-01-01 ENCOUNTER — Telehealth: Payer: Self-pay | Admitting: Neurology

## 2015-01-01 NOTE — Telephone Encounter (Signed)
[place] both #'s.  Per RAS, ok for Ativan 0.5mg  #3, one po 30 min prior to mri, take the others with you to mri.  Is he having mri here at [facility]? If so, I will give med when he arrives early for [facility] tomorrow.  If not, need to know what pharmacy to send rx. to/fim

## 2015-01-01 NOTE — Telephone Encounter (Signed)
Pt called stating he is extremely claustrophobic and will need medication for MRI tomorrow. Pt's wife will be with him. He states he doesn't want anything that will make him "drunk" but something that will make him "not care". Please call and advise at [phone] and [phone].

## 2015-01-02 ENCOUNTER — Ambulatory Visit
Admission: RE | Admit: 2015-01-02 | Discharge: 2015-01-02 | Disposition: A | Discharge status: Home / Self Care | Payer: Managed Care, Other (non HMO) | Source: Ambulatory Visit | Attending: Neurology | Admitting: Neurology

## 2015-01-02 DIAGNOSIS — G450 Vertebro-basilar artery syndrome: Secondary | ICD-10-CM | POA: Diagnosis not present

## 2015-01-02 DIAGNOSIS — R200 Anesthesia of skin: Secondary | ICD-10-CM | POA: Diagnosis not present

## 2015-01-02 DIAGNOSIS — G459 Transient cerebral ischemic attack, unspecified: Secondary | ICD-10-CM

## 2015-01-02 DIAGNOSIS — R42 Dizziness and giddiness: Secondary | ICD-10-CM

## 2015-01-02 DIAGNOSIS — R55 Syncope and collapse: Secondary | ICD-10-CM

## 2015-01-02 MED ORDER — LORAZEPAM 0.5 MG PO TABS: ORAL_TABLET | ORAL | Status: DC

## 2015-01-02 NOTE — Telephone Encounter (Signed)
Rx. faxed to [facility] per pt's request/fim

## 2015-01-04 NOTE — Telephone Encounter (Signed)
-----   Message from Britt Bottom, MD sent at 01/04/2015 11:26 AM EDT ----- Please let him know that the MRI of the brain in the M are a of the arteries look okay. There was good blood flow in the arteries of the back of the head and no evidence of any strokes.

## 2015-01-04 NOTE — Telephone Encounter (Signed)
I have spoken with pt. and per RAS, advised that mri, mra were goot--that there is good blood flow in the arteries in the back of the head and no evidence of cva.  He verbalized understanding of same, sts. he recently began taking vit. b, even tho vit. b level was normal, and dizziness improved some.  Per RAS, I have advised that if vit. b level was normal, he does not need inj., but if dizziness improved when he began taking po vit. b, he should continue it.  Pt. verbalized understanding of same./fim

## 2015-01-21 ENCOUNTER — Other Ambulatory Visit: Payer: Self-pay | Admitting: Radiation Oncology

## 2015-01-22 ENCOUNTER — Telehealth: Payer: Self-pay | Admitting: *Deleted

## 2015-01-22 NOTE — Telephone Encounter (Signed)
CALLED PATIENT TO INFORM OF GOLD SEED PLACEMENT ON 02-27-15 - ARRIVAL TIME - 7:45 AM @ DR. OTTELIN'S OFFICE AND HIS SIM ON 03-01-15 @ 9 AM, SPOKE WITH PATIENT'S WIFE (DIANE) AND SHE IS AWARE OF THESE APPTS.

## 2015-02-19 ENCOUNTER — Other Ambulatory Visit: Payer: Self-pay | Admitting: Urology

## 2015-02-20 ENCOUNTER — Encounter (HOSPITAL_BASED_OUTPATIENT_CLINIC_OR_DEPARTMENT_OTHER): Payer: Self-pay | Admitting: *Deleted

## 2015-02-20 NOTE — Progress Notes (Signed)
NPO AFTER MN WITH EXCEPTION CLEAR LIQUIDS UNTIL 0700( NO CREAM/ MILK PRODUCTS). PT VERBALIZED UNDERSTANDING THIS INCLUDED NO DIP TOBACCO.   ARRIVE AT 1130.  NEEDS ISTAT.  CURRENT EKG IN CHART AND EPIC.  WILL TAKE WELLBUTRIN, DIOVAN, AND METOPROLOL AM DOS W/ SIPS OF WATER.

## 2015-02-22 ENCOUNTER — Ambulatory Visit (HOSPITAL_BASED_OUTPATIENT_CLINIC_OR_DEPARTMENT_OTHER): Payer: Managed Care, Other (non HMO) | Admitting: Anesthesiology

## 2015-02-22 ENCOUNTER — Encounter (HOSPITAL_BASED_OUTPATIENT_CLINIC_OR_DEPARTMENT_OTHER): Payer: Self-pay

## 2015-02-22 ENCOUNTER — Ambulatory Visit (HOSPITAL_BASED_OUTPATIENT_CLINIC_OR_DEPARTMENT_OTHER)
Admission: RE | Admit: 2015-02-22 | Discharge: 2015-02-22 | Disposition: A | Discharge status: Home / Self Care | Payer: Managed Care, Other (non HMO) | Source: Ambulatory Visit | Attending: Urology | Admitting: Urology

## 2015-02-22 ENCOUNTER — Encounter (HOSPITAL_BASED_OUTPATIENT_CLINIC_OR_DEPARTMENT_OTHER)
Admission: RE | Disposition: A | Discharge status: Home / Self Care | Payer: Self-pay | Source: Ambulatory Visit | Attending: Urology

## 2015-02-22 DIAGNOSIS — Z87442 Personal history of urinary calculi: Secondary | ICD-10-CM | POA: Diagnosis not present

## 2015-02-22 DIAGNOSIS — I739 Peripheral vascular disease, unspecified: Secondary | ICD-10-CM | POA: Insufficient documentation

## 2015-02-22 DIAGNOSIS — F17200 Nicotine dependence, unspecified, uncomplicated: Secondary | ICD-10-CM | POA: Insufficient documentation

## 2015-02-22 DIAGNOSIS — G709 Myoneural disorder, unspecified: Secondary | ICD-10-CM | POA: Insufficient documentation

## 2015-02-22 DIAGNOSIS — C61 Malignant neoplasm of prostate: Secondary | ICD-10-CM | POA: Diagnosis present

## 2015-02-22 DIAGNOSIS — I10 Essential (primary) hypertension: Secondary | ICD-10-CM | POA: Diagnosis not present

## 2015-02-22 DIAGNOSIS — M1990 Unspecified osteoarthritis, unspecified site: Secondary | ICD-10-CM | POA: Insufficient documentation

## 2015-02-22 HISTORY — DX: Unspecified symptoms and signs involving the genitourinary system: R39.9

## 2015-02-22 HISTORY — PX: GOLD SEED IMPLANT: SHX6343

## 2015-02-22 HISTORY — DX: Vertebro-basilar artery syndrome: G45.0

## 2015-02-22 HISTORY — DX: Cardiac murmur, unspecified: R01.1

## 2015-02-22 LAB — POCT I-STAT 4, (NA,K, GLUC, HGB,HCT)
Glucose, Bld: 99 mg/dL (ref 65–99)
HCT: 51 % (ref 39.0–52.0)
Hemoglobin: 17.3 g/dL — ABNORMAL HIGH (ref 13.0–17.0)
Potassium: 4.1 mmol/L (ref 3.5–5.1)
Sodium: 140 mmol/L (ref 135–145)

## 2015-02-22 SURGERY — INSERTION, GOLD SEEDS
Anesthesia: General

## 2015-02-22 MED ORDER — HYDROMORPHONE HCL 1 MG/ML IJ SOLN
0.2500 mg | INTRAMUSCULAR | Status: DC | PRN
  Filled 2015-02-22: qty 1

## 2015-02-22 MED ORDER — PROPOFOL 500 MG/50ML IV EMUL
INTRAVENOUS | Status: DC | PRN
  Administered 2015-02-22: 30 mL via INTRAVENOUS
  Administered 2015-02-22: 100 mL via INTRAVENOUS

## 2015-02-22 MED ORDER — OXYCODONE HCL 5 MG PO TABS
5.0000 mg | ORAL_TABLET | Freq: Once | ORAL | Status: DC | PRN
  Filled 2015-02-22: qty 1

## 2015-02-22 MED ORDER — FENTANYL CITRATE (PF) 100 MCG/2ML IJ SOLN
INTRAMUSCULAR | Status: DC | PRN
  Administered 2015-02-22: 50 ug via INTRAVENOUS

## 2015-02-22 MED ORDER — LACTATED RINGERS IV SOLN
INTRAVENOUS | Status: DC
  Administered 2015-02-22: 13:00:00 via INTRAVENOUS
  Filled 2015-02-22: qty 1000

## 2015-02-22 MED ORDER — DEXAMETHASONE SODIUM PHOSPHATE 10 MG/ML IJ SOLN
INTRAMUSCULAR | Status: DC | PRN
  Administered 2015-02-22: 10 mg via INTRAVENOUS

## 2015-02-22 MED ORDER — DEXTROSE 5 % IV SOLN
1.0000 g | Freq: Once | INTRAVENOUS | Status: AC
  Administered 2015-02-22: 1 g via INTRAVENOUS
  Filled 2015-02-22: qty 10

## 2015-02-22 MED ORDER — OXYCODONE HCL 5 MG/5ML PO SOLN
5.0000 mg | Freq: Once | ORAL | Status: DC | PRN
  Filled 2015-02-22: qty 5

## 2015-02-22 MED ORDER — PROMETHAZINE HCL 25 MG/ML IJ SOLN
6.2500 mg | INTRAMUSCULAR | Status: DC | PRN
  Filled 2015-02-22: qty 1

## 2015-02-22 MED ORDER — MIDAZOLAM HCL 5 MG/5ML IJ SOLN
INTRAMUSCULAR | Status: DC | PRN
  Administered 2015-02-22: 2 mg via INTRAVENOUS

## 2015-02-22 MED ORDER — MEPERIDINE HCL 25 MG/ML IJ SOLN
6.2500 mg | INTRAMUSCULAR | Status: DC | PRN
  Filled 2015-02-22: qty 1

## 2015-02-22 MED ORDER — ONDANSETRON HCL 4 MG/2ML IJ SOLN
INTRAMUSCULAR | Status: DC | PRN
  Administered 2015-02-22: 4 mg via INTRAVENOUS

## 2015-02-22 MED ORDER — LIDOCAINE HCL (CARDIAC) 20 MG/ML IV SOLN
INTRAVENOUS | Status: DC | PRN
  Administered 2015-02-22: 70 mg via INTRAVENOUS

## 2015-02-22 SURGICAL SUPPLY — 3 items
MARKER GOLD PRELOAD 1.2X3 (Urological Implant) ×1 IMPLANT
SEED GOLD PRELOAD 1.2X3 (Urological Implant) ×2 IMPLANT
SURGILUBE 2OZ TUBE FLIPTOP (MISCELLANEOUS) ×1 IMPLANT

## 2015-02-22 NOTE — Anesthesia Preprocedure Evaluation (Signed)
Anesthesia Evaluation  Patient identified by MRN, date of birth, ID band Patient awake    Reviewed: Allergy & Precautions, NPO status , Patient's Chart, lab work & pertinent test results  History of Anesthesia Complications Negative for: history of anesthetic complications  Airway Mallampati: II  TM Distance: >3 FB Neck ROM: Full    Dental  (+) Upper Dentures, Lower Dentures, Dental Advisory Given   Pulmonary Current Smoker,    Pulmonary exam normal        Cardiovascular hypertension, + Peripheral Vascular Disease  Normal cardiovascular exam+ Valvular Problems/Murmurs   Study Conclusions  - Left ventricle: The cavity size was normal. There was mild concentric hypertrophy. Systolic function was normal. The estimated ejection fraction was in the range of 55% to 60%. Wall motion was normal; there were no regional wall motion abnormalities. Doppler parameters are consistent with abnormal left ventricular relaxation (grade 1 diastolic dysfunction). - Aortic valve: Possibly bicuspid. There was mild stenosis. Valve area (VTI): 1.87 cm^2. Valve area (Vmax): 1.66 cm^2. Valve area (Vmean): 1.79 cm^2.     Neuro/Psych PSYCHIATRIC DISORDERS  Neuromuscular disease    GI/Hepatic negative GI ROS, Neg liver ROS,   Endo/Other    Renal/GU negative Renal ROS     Musculoskeletal  (+) Arthritis ,   Abdominal   Peds  Hematology   Anesthesia Other Findings   Reproductive/Obstetrics                             Anesthesia Physical  Anesthesia Plan  ASA: III  Anesthesia Plan: General   Post-op Pain Management:    Induction: Intravenous  Airway Management Planned: LMA  Additional Equipment:   Intra-op Plan:   Post-operative Plan: Extubation in OR  Informed Consent: I have reviewed the patients History and Physical, chart, labs and discussed the procedure including the risks,  benefits and alternatives for the proposed anesthesia with the patient or authorized representative who has indicated his/her understanding and acceptance.   Dental advisory given  Plan Discussed with: CRNA, Anesthesiologist and Surgeon  Anesthesia Plan Comments: (Pt states he cannot remove dentures.  Accepts risk of damage to dentures with airway protection)        Anesthesia Quick Evaluation
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
## 2015-02-22 NOTE — Op Note (Signed)
PATIENT:  Andrew Stevenson  PRE-OPERATIVE DIAGNOSIS: Adenocarcinoma of the prostate  POST-OPERATIVE DIAGNOSIS: Same  PROCEDURE: Gold fiducial marker placement  SURGEON:  Claybon Jabs  INDICATION: Andrew Stevenson is a 69 year old male with newly diagnosed adenocarcinoma the prostate who has elected to undergo external beam radiation. In preparation for this gold seed fiducial markers are placed.  ANESTHESIA:  MAC   EBL:  None  DRAINS: None  LOCAL MEDICATIONS USED:  None  SPECIMEN:  None  Description of procedure: After informed consent the patient was taken to the operating room and given intravenous sedation. Once fully sedated the patient was moved to the left lateral position position. An official timeout was then performed.  Procedure  Procedure: Transrectal ultrasound of the prostate, Ultrasound Guided Placement of fiducial markers.  Indication: Prostate cancer.  Consent: Risks, benefits, and potential adverse events were discussed and informed consent was obtained from the patient.  Prep: Fleets enema.  Local Anesthesia: A periprostatic nerve block was performed with 10 cc of 2% Xylocaine  Antibiotic prophylaxis: Levofloxacin  Procedure Note:  The patient was placed in the lateral decubitus position. A total of 3 gold seed markers were implanted in the prostate at the right base, right apex, and left lateral prostate. The position of the right base seed was within 3mm of the base, the right apex seed 51mm of the apex, and the lateral seed just inside the capsule of the prostate.  Patient Status:. The patient tolerated the procedure well.  Complications:. None.     PLAN OF CARE: Discharge to home after PACU  PATIENT DISPOSITION:  PACU - hemodynamically stable.

## 2015-02-22 NOTE — Discharge Instructions (Signed)
°  Call your surgeon if you experience:   1.  Fever over 101.0. 2.  Inability to urinate. 3.  Nausea and/or vomiting. 4.  Extreme swelling or bruising at the surgical site. 5.  Continued bleeding.  6.  Increased pain, redness or drainage  7.  Problems related to your pain medication. 8.  Any problems and/or concerns Post Anesthesia Home Care Instructions  Activity: Get plenty of rest for the remainder of the day. A responsible adult should stay with you for 24 hours following the procedure.  For the next 24 hours, DO NOT: -Drive a car -Paediatric nurse -Drink alcoholic beverages -Take any medication unless instructed by your physician -Make any legal decisions or sign important papers.  Meals: Start with liquid foods such as gelatin or soup. Progress to regular foods as tolerated. Avoid greasy, spicy, heavy foods. If nausea and/or vomiting occur, drink only clear liquids until the nausea and/or vomiting subsides. Call your physician if vomiting continues.  Special Instructions/Symptoms: Your throat may feel dry or sore from the anesthesia or the breathing tube placed in your throat during surgery. If this causes discomfort, gargle with warm salt water. The discomfort should disappear within 24 hours.  If you had a scopolamine patch placed behind your ear for the management of post- operative nausea and/or vomiting:  1. The medication in the patch is effective for 72 hours, after which it should be removed.  Wrap patch in a tissue and discard in the trash. Wash hands thoroughly with soap and water. 2. You may remove the patch earlier than 72 hours if you experience unpleasant side effects which may include dry mouth, dizziness or visual disturbances. 3. Avoid touching the patch. Wash your hands with soap and water after contact with the patch.

## 2015-02-22 NOTE — Transfer of Care (Signed)
Immediate Anesthesia Transfer of Care Note  Patient: Andrew Stevenson  Procedure(s) Performed: Procedure(s): GOLD SEED IMPLANT (N/A)  Patient Location: PACU  Anesthesia Type:MAC  Level of Consciousness: awake, alert , oriented and patient cooperative  Airway & Oxygen Therapy: Patient Spontanous Breathing and Patient connected to nasal cannula oxygen  Post-op Assessment: Report given to RN and Post -op Vital signs reviewed and stable  Post vital signs: Reviewed and stable  Last Vitals:  Filed Vitals:   02/22/15 1144  BP: 125/80  Pulse: 50  Temp: 37 C  Resp: 16    Complications: No apparent anesthesia complications

## 2015-02-22 NOTE — Anesthesia Procedure Notes (Signed)
Procedure Name: MAC Date/Time: 02/22/2015 1:05 PM Performed by: Wanita Chamberlain Pre-anesthesia Checklist: Patient identified, Timeout performed, Emergency Drugs available, Suction available and Patient being monitored Patient Re-evaluated:Patient Re-evaluated prior to inductionOxygen Delivery Method: Nasal cannula Preoxygenation: Pre-oxygenation with 100% oxygen Intubation Type: IV induction Placement Confirmation: breath sounds checked- equal and bilateral and positive ETCO2 Dental Injury: Teeth and Oropharynx as per pre-operative assessment

## 2015-02-25 ENCOUNTER — Encounter (HOSPITAL_BASED_OUTPATIENT_CLINIC_OR_DEPARTMENT_OTHER): Payer: Self-pay | Admitting: Urology

## 2015-02-25 NOTE — Anesthesia Postprocedure Evaluation (Signed)
Anesthesia Post Note  Patient: Andrew Stevenson  Procedure(s) Performed: Procedure(s) (LRB): GOLD SEED IMPLANT (N/A)  Patient location during evaluation: PACU Anesthesia Type: MAC Level of consciousness: awake and alert Pain management: pain level controlled Vital Signs Assessment: post-procedure vital signs reviewed and stable Respiratory status: spontaneous breathing Cardiovascular status: stable Anesthetic complications: no    Last Vitals:  Filed Vitals:   02/22/15 1345 02/22/15 1429  BP: 126/72 125/65  Pulse: 48 48  Temp:  36.4 C  Resp: 18 14    Last Pain: There were no vitals filed for this visit.               Nolon Nations

## 2015-03-01 ENCOUNTER — Ambulatory Visit
Admission: RE | Admit: 2015-03-01 | Discharge: 2015-03-01 | Disposition: A | Discharge status: Home / Self Care | Payer: Managed Care, Other (non HMO) | Source: Ambulatory Visit | Attending: Radiation Oncology | Admitting: Radiation Oncology

## 2015-03-01 DIAGNOSIS — C61 Malignant neoplasm of prostate: Secondary | ICD-10-CM | POA: Diagnosis not present

## 2015-03-01 NOTE — Progress Notes (Signed)
  Radiation Oncology         [phone] ________________________________  Name: Andrew Stevenson MRN: [id number]  Date: 03/01/2015  DOB: Aug 10, 1945  SIMULATION AND TREATMENT PLANNING NOTE    ICD-9-CM ICD-10-CM   1. Malignant neoplasm of prostate ([place]) 185 C61     DIAGNOSIS:  69 y.o. gentleman with stage T1c adenocarcinoma of the prostate with a Gleason's score of 4+3 and a PSA of 5.26.  NARRATIVE:  The patient was brought to the [facility].  Identity was confirmed.  All relevant records and images related to the planned course of therapy were reviewed.  The patient freely provided informed written consent to proceed with treatment after reviewing the details related to the planned course of therapy. The consent form was witnessed and verified by the simulation staff.  Then, the patient was set-up in a stable reproducible supine position for radiation therapy.  A vacuum lock pillow device was custom fabricated to position his legs in a reproducible immobilized position.  Then, I performed a urethrogram under sterile conditions to identify the prostatic apex.  CT images were obtained.  Surface markings were placed.  The CT images were loaded into the planning software.  Then the prostate target and avoidance structures including the rectum, bladder, bowel and hips were contoured.  Treatment planning then occurred.  The radiation prescription was entered and confirmed.  A total of 5 complex treatment devices were fabricated including 4-field MLCs and one BodyFix. I have requested : 3D Simulation  I have requested a DVH of the following structures: Prostate, bladder, right hip, left hip and rectum.  PUBIC ARCH STUDY:  The patient presented today for evaluation for possible prostate seed implant. He was brought to the radiation planning suite and placed supine on the CT couch. A 3-dimensional image study set was obtained in upload to the planning computer. There, on each axial slice, I  contoured the prostate gland. Then, using three-dimensional radiation planning tools I reconstructed the prostate in view of the structures from the transperineal needle pathway to assess for possible pubic arch interference. In doing so, I did not appreciate any pubic arch interference. Also, the patient's prostate volume was estimated based on the drawn structure. The volume was 58 cc.  Given the pubic arch appearance and prostate volume, patient remains a good candidate to proceed with prostate seed implant. Today, he freely provided informed written consent to proceed.    PLAN:  The patient will receive 45 Gy in 25 fractions followed by prostate seed implant.  ________________________________  Sheral Apley. Tammi Klippel, M.D.     This document serves as a record of services personally performed by Tyler Pita, MD. It was created on his behalf by Lendon Collar, a trained medical scribe. The creation of this record is based on the scribe's personal observations and the provider's statements to them. This document has been checked and approved by the attending provider.

## 2015-03-08 ENCOUNTER — Ambulatory Visit
Admission: RE | Admit: 2015-03-08 | Discharge: 2015-03-08 | Disposition: A | Discharge status: Home / Self Care | Payer: Managed Care, Other (non HMO) | Source: Ambulatory Visit | Attending: Radiation Oncology | Admitting: Radiation Oncology

## 2015-03-12 ENCOUNTER — Ambulatory Visit
Admission: RE | Admit: 2015-03-12 | Discharge: 2015-03-12 | Disposition: A | Discharge status: Home / Self Care | Payer: Managed Care, Other (non HMO) | Source: Ambulatory Visit | Attending: Radiation Oncology | Admitting: Radiation Oncology

## 2015-03-13 ENCOUNTER — Ambulatory Visit
Admission: RE | Admit: 2015-03-13 | Discharge: 2015-03-13 | Disposition: A | Discharge status: Home / Self Care | Payer: Managed Care, Other (non HMO) | Source: Ambulatory Visit | Attending: Radiation Oncology | Admitting: Radiation Oncology

## 2015-03-14 ENCOUNTER — Ambulatory Visit
Admission: RE | Admit: 2015-03-14 | Discharge: 2015-03-14 | Disposition: A | Discharge status: Home / Self Care | Payer: Managed Care, Other (non HMO) | Source: Ambulatory Visit | Attending: Radiation Oncology | Admitting: Radiation Oncology

## 2015-03-15 ENCOUNTER — Other Ambulatory Visit: Payer: Self-pay | Admitting: Urology

## 2015-03-15 ENCOUNTER — Ambulatory Visit
Admission: RE | Admit: 2015-03-15 | Discharge: 2015-03-15 | Disposition: A | Discharge status: Home / Self Care | Payer: Managed Care, Other (non HMO) | Source: Ambulatory Visit | Attending: Radiation Oncology | Admitting: Radiation Oncology

## 2015-03-15 ENCOUNTER — Encounter: Payer: Self-pay | Admitting: Radiation Oncology

## 2015-03-15 VITALS — BP 112/62 | HR 55 | Resp 16 | Wt 250.4 lb

## 2015-03-15 DIAGNOSIS — C61 Malignant neoplasm of prostate: Secondary | ICD-10-CM | POA: Diagnosis present

## 2015-03-15 NOTE — Progress Notes (Addendum)
Weight and vitals stable. Denies pain. Denies dysuria or hematuria. Denies diarrhea. Reports he rarely gets up during the night to void. Describes a strong steady urine stream. Denies incontinence or leakage. Denies fatigue. Oriented patient to staff and routine of the clinic. Provided patient with RADIATION THERAPY AND YOU handbook then, reviewed pertinent information. Educated patient reference potential side effects and management such as fatigue, skin changes, diarrhea, and urinary bladder changes. Provided patient with my business card and encouraged him to call with needs. Patient verbalized understanding of all reviewed.   BP 112/62 mmHg  Pulse 55  Resp 16  Wt 250 lb 6.4 oz (113.581 kg)  SpO2 100% Wt Readings from Last 3 Encounters:  03/15/15 250 lb 6.4 oz (113.581 kg)  02/22/15 244 lb 8 oz (110.904 kg)  12/18/14 251 lb (113.853 kg)

## 2015-03-15 NOTE — Progress Notes (Signed)
  Radiation Oncology         [phone]   Name: Andrew Stevenson MRN: [id number]   Date: 03/15/2015  DOB: October 25, 1945     Weekly Radiation Therapy Management    ICD-9-CM ICD-10-CM   1. Malignant neoplasm of prostate ([place]) 185 C61     Current Dose: 7.2 Gy  Planned Dose:  45 Gy  Narrative The patient presents for routine under treatment assessment.  Weight and vitals stable. Denies pain. Denies dysuria or hematuria. Denies diarrhea. Reports he rarely gets up during the night to void. Describes a strong steady urine stream. Denies incontinence or leakage. Denies fatigue.  The patient is without complaint. Set-up films were reviewed. The chart was checked.  Physical Findings  weight is 250 lb 6.4 oz (113.581 kg). His blood pressure is 112/62 and his pulse is 55. His respiration is 16 and oxygen saturation is 100%. . Weight essentially stable.  No significant changes.  Impression The patient is tolerating radiation.  Plan Continue treatment as planned.         Sheral Apley Tammi Klippel, M.D.  This document serves as a record of services personally performed by Tyler Pita, MD. It was created on his behalf by Lendon Collar, a trained medical scribe. The creation of this record is based on the scribe's personal observations and the provider's statements to them. This document has been checked and approved by the attending provider.

## 2015-03-19 ENCOUNTER — Ambulatory Visit
Admission: RE | Admit: 2015-03-19 | Discharge: 2015-03-19 | Disposition: A | Discharge status: Home / Self Care | Payer: Managed Care, Other (non HMO) | Source: Ambulatory Visit | Attending: Radiation Oncology | Admitting: Radiation Oncology

## 2015-03-19 ENCOUNTER — Ambulatory Visit: Payer: Medicare Other | Admitting: Neurology

## 2015-03-19 DIAGNOSIS — C61 Malignant neoplasm of prostate: Secondary | ICD-10-CM | POA: Diagnosis not present

## 2015-03-19 NOTE — Addendum Note (Signed)
Encounter addended by: Heywood Footman, RN on: 03/19/2015  4:19 PM<BR>     Documentation filed: Notes Section

## 2015-03-20 ENCOUNTER — Ambulatory Visit
Admission: RE | Admit: 2015-03-20 | Discharge: 2015-03-20 | Disposition: A | Discharge status: Home / Self Care | Payer: Managed Care, Other (non HMO) | Source: Ambulatory Visit | Attending: Radiation Oncology | Admitting: Radiation Oncology

## 2015-03-20 DIAGNOSIS — C61 Malignant neoplasm of prostate: Secondary | ICD-10-CM | POA: Diagnosis not present

## 2015-03-21 ENCOUNTER — Ambulatory Visit
Admission: RE | Admit: 2015-03-21 | Discharge: 2015-03-21 | Disposition: A | Discharge status: Home / Self Care | Payer: Managed Care, Other (non HMO) | Source: Ambulatory Visit | Attending: Radiation Oncology | Admitting: Radiation Oncology

## 2015-03-21 ENCOUNTER — Ambulatory Visit: Payer: Managed Care, Other (non HMO)

## 2015-03-22 ENCOUNTER — Encounter: Payer: Self-pay | Admitting: Radiation Oncology

## 2015-03-22 ENCOUNTER — Ambulatory Visit
Admission: RE | Admit: 2015-03-22 | Discharge: 2015-03-22 | Disposition: A | Discharge status: Home / Self Care | Payer: Managed Care, Other (non HMO) | Source: Ambulatory Visit | Attending: Radiation Oncology | Admitting: Radiation Oncology

## 2015-03-22 VITALS — BP 133/90 | HR 54 | Resp 16 | Wt 252.3 lb

## 2015-03-22 DIAGNOSIS — C61 Malignant neoplasm of prostate: Secondary | ICD-10-CM

## 2015-03-22 NOTE — Progress Notes (Addendum)
Weight and vitals stable. Denies pain. Denies dysuria or hematuria. Denies diarrhea. Reports he rarely gets up during the night to void. Describes a strong steady urine stream. Reports leakage is unchanged. Denies fatigue.  BP 133/90 mmHg  Pulse 54  Resp 16  Wt 252 lb 4.8 oz (114.443 kg)  SpO2 100% Wt Readings from Last 3 Encounters:  03/22/15 252 lb 4.8 oz (114.443 kg)  03/15/15 250 lb 6.4 oz (113.581 kg)  02/22/15 244 lb 8 oz (110.904 kg)

## 2015-03-22 NOTE — Progress Notes (Signed)
  Radiation Oncology         [phone]   Name: Andrew Stevenson MRN: [id number]   Date: 03/22/2015  DOB: Jan 17, 1946     Weekly Radiation Therapy Management    ICD-9-CM ICD-10-CM   1. Malignant neoplasm of prostate ([place]) 185 C61     Current Dose: 12.6 Gy  Planned Dose:  45 Gy  Narrative The patient presents for routine under treatment assessment.  Weight and vitals stable. Denies pain. Denies dysuria or hematuria. Denies diarrhea. Reports he rarely gets up during the night to void. Reports frequency during the day. Describes a strong steady urine stream. Reports leakage is unchanged. Denies fatigue.  The patient is without complaint. Set-up films were reviewed. The chart was checked.  Physical Findings  weight is 252 lb 4.8 oz (114.443 kg). His blood pressure is 133/90 and his pulse is 54. His respiration is 16 and oxygen saturation is 100%. . Weight essentially stable.  No significant changes.  Impression The patient is tolerating radiation.  Plan Continue treatment as planned.         Sheral Apley Tammi Klippel, M.D.  This document serves as a record of services personally performed by Tyler Pita, MD. It was created on his behalf by Arlyce Harman, a trained medical scribe. The creation of this record is based on the scribe's personal observations and the provider's statements to them. This document has been checked and approved by the attending provider.

## 2015-03-25 ENCOUNTER — Ambulatory Visit
Admission: RE | Admit: 2015-03-25 | Discharge: 2015-03-25 | Disposition: A | Discharge status: Home / Self Care | Payer: Managed Care, Other (non HMO) | Source: Ambulatory Visit | Attending: Radiation Oncology | Admitting: Radiation Oncology

## 2015-03-25 ENCOUNTER — Ambulatory Visit: Payer: Managed Care, Other (non HMO)

## 2015-03-26 ENCOUNTER — Ambulatory Visit
Admission: RE | Admit: 2015-03-26 | Discharge: 2015-03-26 | Disposition: A | Discharge status: Home / Self Care | Payer: Managed Care, Other (non HMO) | Source: Ambulatory Visit | Attending: Radiation Oncology | Admitting: Radiation Oncology

## 2015-03-26 DIAGNOSIS — C61 Malignant neoplasm of prostate: Secondary | ICD-10-CM | POA: Diagnosis not present

## 2015-03-27 ENCOUNTER — Telehealth: Payer: Self-pay | Admitting: Radiation Oncology

## 2015-03-27 ENCOUNTER — Ambulatory Visit
Admission: RE | Admit: 2015-03-27 | Discharge: 2015-03-27 | Disposition: A | Discharge status: Home / Self Care | Payer: Managed Care, Other (non HMO) | Source: Ambulatory Visit | Attending: Radiation Oncology | Admitting: Radiation Oncology

## 2015-03-27 ENCOUNTER — Ambulatory Visit: Payer: Managed Care, Other (non HMO)

## 2015-03-27 NOTE — Telephone Encounter (Signed)
Returned patient's call. Patient reports a persistent cough, runny nose and new onset "cold." Patient denies that he has a fever. Patient concerned he will be unable to lay still for treatment. Patient reports when he lays flat his cough is worse. Patient cancelled treatment for today. Patient expressed that he plans to present to his PCP in the morning if his symptoms have not improved.

## 2015-03-28 ENCOUNTER — Ambulatory Visit
Admission: RE | Admit: 2015-03-28 | Discharge: 2015-03-28 | Disposition: A | Discharge status: Home / Self Care | Payer: Managed Care, Other (non HMO) | Source: Ambulatory Visit | Attending: Radiation Oncology | Admitting: Radiation Oncology

## 2015-03-28 ENCOUNTER — Ambulatory Visit: Payer: Managed Care, Other (non HMO)

## 2015-03-29 ENCOUNTER — Ambulatory Visit
Admission: RE | Admit: 2015-03-29 | Discharge: 2015-03-29 | Disposition: A | Discharge status: Home / Self Care | Payer: Managed Care, Other (non HMO) | Source: Ambulatory Visit | Attending: Radiation Oncology | Admitting: Radiation Oncology

## 2015-03-29 ENCOUNTER — Encounter: Payer: Self-pay | Admitting: Radiation Oncology

## 2015-03-29 VITALS — BP 142/76 | HR 53 | Resp 16 | Wt 251.7 lb

## 2015-03-29 DIAGNOSIS — C61 Malignant neoplasm of prostate: Secondary | ICD-10-CM

## 2015-03-29 NOTE — Progress Notes (Signed)
  Radiation Oncology         [id number]   Name: Andrew Stevenson MRN: [id number]   Date: 03/29/2015  DOB: 06-Dec-1945     Weekly Radiation Therapy Management    ICD-9-CM ICD-10-CM   1. Malignant neoplasm of prostate ([facility]) 185 C61     Current Dose:  16.2 Gy  Planned Dose:  45 Gy  Narrative The patient presents for routine under treatment assessment.  Weight and vitals stable. Denies pain. Denies dysuria or hematuria. Reports loose stool and bowel urgency. Reports he rarely gets up during the night to void. Describes a strong steady urine stream. Reports leakage is unchanged. Denies fatigue. Recovering nicely from cold after receiving azithromycin from PCP. Patient reports diarrhea.  The patient is without complaint. Set-up films were reviewed. The chart was checked.  Physical Findings  weight is 251 lb 11.2 oz (114.17 kg). His blood pressure is 142/76 and his pulse is 53. His respiration is 16.  Weight essentially stable.  No significant changes.  Impression The patient is tolerating radiation.  Plan Continue treatment as planned.         Sheral Apley Tammi Klippel, M.D.  This document serves as a record of services personally performed by Tyler Pita, MD. It was created on his behalf by Jenell Milliner, a trained medical scribe. The creation of this record is based on the scribe's personal observations and the provider's statements to them. This document has been checked and approved by the attending provider.

## 2015-03-29 NOTE — Progress Notes (Signed)
Weight and vitals stable. Denies pain. Denies dysuria or hematuria. Reports loose stool and bowel urgency. Reports he rarely gets up during the night to void. Describes a strong steady urine stream. Reports leakage is unchanged. Denies fatigue. Recovering nicely from cold after receiving azithromycin from PCP.  BP 142/76 mmHg  Pulse 53  Resp 16  Wt 251 lb 11.2 oz (114.17 kg) Wt Readings from Last 3 Encounters:  03/29/15 251 lb 11.2 oz (114.17 kg)  03/22/15 252 lb 4.8 oz (114.443 kg)  03/15/15 250 lb 6.4 oz (113.581 kg)

## 2015-04-01 ENCOUNTER — Ambulatory Visit
Admission: RE | Admit: 2015-04-01 | Discharge: 2015-04-01 | Disposition: A | Discharge status: Home / Self Care | Payer: Managed Care, Other (non HMO) | Source: Ambulatory Visit | Attending: Radiation Oncology | Admitting: Radiation Oncology

## 2015-04-01 DIAGNOSIS — R9721 Rising PSA following treatment for malignant neoplasm of prostate: Secondary | ICD-10-CM | POA: Insufficient documentation

## 2015-04-01 DIAGNOSIS — C61 Malignant neoplasm of prostate: Secondary | ICD-10-CM | POA: Insufficient documentation

## 2015-04-01 DIAGNOSIS — Z510 Encounter for antineoplastic radiation therapy: Secondary | ICD-10-CM | POA: Diagnosis present

## 2015-04-02 ENCOUNTER — Ambulatory Visit
Admission: RE | Admit: 2015-04-02 | Discharge: 2015-04-02 | Disposition: A | Discharge status: Home / Self Care | Payer: Managed Care, Other (non HMO) | Source: Ambulatory Visit | Attending: Radiation Oncology | Admitting: Radiation Oncology

## 2015-04-02 DIAGNOSIS — Z510 Encounter for antineoplastic radiation therapy: Secondary | ICD-10-CM | POA: Diagnosis not present

## 2015-04-03 ENCOUNTER — Ambulatory Visit
Admission: RE | Admit: 2015-04-03 | Discharge: 2015-04-03 | Disposition: A | Discharge status: Home / Self Care | Payer: Managed Care, Other (non HMO) | Source: Ambulatory Visit | Attending: Radiation Oncology | Admitting: Radiation Oncology

## 2015-04-03 DIAGNOSIS — Z510 Encounter for antineoplastic radiation therapy: Secondary | ICD-10-CM | POA: Diagnosis not present

## 2015-04-04 ENCOUNTER — Ambulatory Visit
Admission: RE | Admit: 2015-04-04 | Discharge: 2015-04-04 | Disposition: A | Discharge status: Home / Self Care | Payer: Managed Care, Other (non HMO) | Source: Ambulatory Visit | Attending: Radiation Oncology | Admitting: Radiation Oncology

## 2015-04-04 DIAGNOSIS — Z510 Encounter for antineoplastic radiation therapy: Secondary | ICD-10-CM | POA: Diagnosis not present

## 2015-04-05 ENCOUNTER — Ambulatory Visit
Admission: RE | Admit: 2015-04-05 | Discharge: 2015-04-05 | Disposition: A | Discharge status: Home / Self Care | Payer: Managed Care, Other (non HMO) | Source: Ambulatory Visit | Attending: Radiation Oncology | Admitting: Radiation Oncology

## 2015-04-05 VITALS — BP 127/75 | HR 55 | Resp 16 | Wt 250.6 lb

## 2015-04-05 DIAGNOSIS — Z510 Encounter for antineoplastic radiation therapy: Secondary | ICD-10-CM | POA: Diagnosis not present

## 2015-04-05 DIAGNOSIS — C61 Malignant neoplasm of prostate: Secondary | ICD-10-CM

## 2015-04-05 NOTE — Progress Notes (Signed)
  Radiation Oncology         [phone]   Name: Andrew Stevenson MRN: [id number]   Date: 04/05/2015  DOB: January 04, 1946     Weekly Radiation Therapy Management    ICD-9-CM ICD-10-CM   1. Malignant neoplasm of prostate ([place]) 185 C61     Current Dose:  25.2 Gy  Planned Dose:  45 Gy  Narrative The patient presents for routine under treatment assessment.  Weight and vitals stable. Reports difficulty starting and maintaining the urine stream. Denies dysuria. Reports scant blood in stool. Reports nocturia x 2. Reports increased urinary frequency. Reports increased urinary frequency between the hours of 7 pm - 11 pm. Urinates about 12 to 15 times daily. These urinary symptoms were not present prior to radiation. Denies rash or skin irritation. Denies diarrhea or constipation but, does report stool is loose. Reports some fatigue. This past week he felt some nausea that led to dry heaving, he is unsure what caused this.   The patient is without complaint. Set-up films were reviewed. The chart was checked.  Physical Findings  weight is 250 lb 9.6 oz (113.671 kg). His blood pressure is 127/75 and his pulse is 55. His respiration is 16 and oxygen saturation is 100%.   Pain scale 0/10   This is a well-appearing Caucasian male in no acute distress. Weight essentially stable.    Impression The patient is tolerating radiation.  Plan Continue treatment as planned. We will continue to follow up with his symptoms and he will keep [place] informed if any become intolerable.      Sheral Apley Tammi Klippel, M.D.

## 2015-04-05 NOTE — Patient Instructions (Signed)
Contact our office if you have any questions following today's appointment: [phone].  

## 2015-04-05 NOTE — Progress Notes (Signed)
Weight and vitals stable. Reports difficulty starting and maintaining the urine stream. Denies dysuria. Reports scant hematuria. Reports nocturia x 2. Reports increased urinary frequency. Reports increased urinary frequency between the hours of 7 pm - 11 pm. Denies diarrhea or constipation but, does reports stool is loose. Most concerned about new onset of nausea.   BP 127/75 mmHg  Pulse 55  Resp 16  Wt 250 lb 9.6 oz (113.671 kg)  SpO2 100% Wt Readings from Last 3 Encounters:  04/05/15 250 lb 9.6 oz (113.671 kg)  03/29/15 251 lb 11.2 oz (114.17 kg)  03/22/15 252 lb 4.8 oz (114.443 kg)

## 2015-04-08 ENCOUNTER — Ambulatory Visit
Admission: RE | Admit: 2015-04-08 | Discharge: 2015-04-08 | Disposition: A | Discharge status: Home / Self Care | Payer: Managed Care, Other (non HMO) | Source: Ambulatory Visit | Attending: Radiation Oncology | Admitting: Radiation Oncology

## 2015-04-08 DIAGNOSIS — Z510 Encounter for antineoplastic radiation therapy: Secondary | ICD-10-CM | POA: Diagnosis not present

## 2015-04-09 ENCOUNTER — Ambulatory Visit
Admission: RE | Admit: 2015-04-09 | Discharge: 2015-04-09 | Disposition: A | Discharge status: Home / Self Care | Payer: Managed Care, Other (non HMO) | Source: Ambulatory Visit | Attending: Radiation Oncology | Admitting: Radiation Oncology

## 2015-04-09 DIAGNOSIS — Z510 Encounter for antineoplastic radiation therapy: Secondary | ICD-10-CM | POA: Diagnosis not present

## 2015-04-10 ENCOUNTER — Ambulatory Visit
Admission: RE | Admit: 2015-04-10 | Discharge: 2015-04-10 | Disposition: A | Discharge status: Home / Self Care | Payer: Managed Care, Other (non HMO) | Source: Ambulatory Visit | Attending: Radiation Oncology | Admitting: Radiation Oncology

## 2015-04-10 DIAGNOSIS — Z510 Encounter for antineoplastic radiation therapy: Secondary | ICD-10-CM | POA: Diagnosis not present

## 2015-04-11 ENCOUNTER — Ambulatory Visit
Admission: RE | Admit: 2015-04-11 | Discharge: 2015-04-11 | Disposition: A | Discharge status: Home / Self Care | Payer: Managed Care, Other (non HMO) | Source: Ambulatory Visit | Attending: Radiation Oncology | Admitting: Radiation Oncology

## 2015-04-11 DIAGNOSIS — Z510 Encounter for antineoplastic radiation therapy: Secondary | ICD-10-CM | POA: Diagnosis not present

## 2015-04-12 ENCOUNTER — Ambulatory Visit
Admission: RE | Admit: 2015-04-12 | Discharge: 2015-04-12 | Disposition: A | Discharge status: Home / Self Care | Payer: Managed Care, Other (non HMO) | Source: Ambulatory Visit | Attending: Radiation Oncology | Admitting: Radiation Oncology

## 2015-04-12 ENCOUNTER — Encounter: Payer: Self-pay | Admitting: Radiation Oncology

## 2015-04-12 VITALS — BP 136/75 | HR 51 | Resp 16 | Wt 250.2 lb

## 2015-04-12 DIAGNOSIS — C61 Malignant neoplasm of prostate: Secondary | ICD-10-CM

## 2015-04-12 DIAGNOSIS — Z510 Encounter for antineoplastic radiation therapy: Secondary | ICD-10-CM | POA: Diagnosis not present

## 2015-04-12 NOTE — Progress Notes (Signed)
  Radiation Oncology         [phone]   Name: Andrew Stevenson MRN: [id number]   Date: 04/12/2015  DOB: 09-Sep-1945     Weekly Radiation Therapy Management    ICD-9-CM ICD-10-CM   1. Malignant neoplasm of prostate ([place]) 185 C61     Current Dose:  34.2 Gy  Planned Dose:  45 Gy  Narrative The patient presents for routine under treatment assessment.  On review of systems the patient reports that he is doing pretty well overall. He continues to have multiple episodes of urinary frequency late in the evening but does not but that until sometimes 1 or 2 in the morning. He states that when he does go to sleep he is awakened 1 or 2 times to urinate. He is able to get back to sleep without difficulty. He has had episodes of hesitancy with initiating a stream, and denies dysuria though he states he had 1 episode of a burning sensation in his urethra while urinating last week. Although his stools are loose, he denies any diarrhea. No other complaints or verbalized.  Set-up films were reviewed. The chart was checked.  Physical Findings  weight is 250 lb 3.2 oz (113.49 kg). His blood pressure is 136/75 and his pulse is 51. His respiration is 16.    Pain scale 0/10 This is a well-appearing Caucasian male in no acute distress. Cardiopulmonary assessment reveals no distress and normal effort.  Impression Stage T1c, intermediate risk, adenocarcinoma of the prostate with a Gleason score 4+3 in PSA of 5.26.  Plan Continue treatment as planned. We will continue moving forward planning for his seed implant following external beam radiation.     The above documentation reflects my direct findings during this shared patient visit. Please see the separate note by Dr. Tammi Klippel on this date for the remainder of the patient's plan of care.  Carola Rhine, PAC   This document serves as a record of services personally performed by Shona Simpson, PAC and Tyler Pita, MD. It was created on their behalf by  Jenell Milliner, a trained medical scribe. The creation of this record is based on the scribe's personal observations and the provider's statements to them. This document has been checked and approved by the attending provider.

## 2015-04-12 NOTE — Progress Notes (Signed)
Weight and vitals stable. Andrew Stevenson reviewed seed information with patient and wife today. Reports difficulty starting and maintaining the urine stream. Denies dysuria.  Reports one episode of burning in his penis after urinationthat quickly resolved. Reports scant hematuria. Reports nocturia x 2. Reports increased urinary frequency. Reports increased urinary frequency between the hours of 7 pm - 11 pm. Denies diarrhea or constipation but, does reports stool is loose. Reports periodic nausea continues but, no vomiting.   BP 136/75 mmHg  Pulse 51  Resp 16  Wt 250 lb 3.2 oz (113.49 kg) Wt Readings from Last 3 Encounters:  04/12/15 250 lb 3.2 oz (113.49 kg)  04/05/15 250 lb 9.6 oz (113.671 kg)  03/29/15 251 lb 11.2 oz (114.17 kg)

## 2015-04-15 ENCOUNTER — Telehealth: Payer: Self-pay | Admitting: *Deleted

## 2015-04-15 ENCOUNTER — Ambulatory Visit
Admission: RE | Admit: 2015-04-15 | Discharge: 2015-04-15 | Disposition: A | Discharge status: Home / Self Care | Payer: Managed Care, Other (non HMO) | Source: Ambulatory Visit | Attending: Radiation Oncology | Admitting: Radiation Oncology

## 2015-04-15 DIAGNOSIS — Z510 Encounter for antineoplastic radiation therapy: Secondary | ICD-10-CM | POA: Diagnosis not present

## 2015-04-15 NOTE — Telephone Encounter (Signed)
CALLED PATIENT TO REMIND TO GET CHEST X-RAY TOMORROW - (04/16/15), SPOKE WITH PATIENT AND HE WILL GET THIS DONE TOMORROW

## 2015-04-16 ENCOUNTER — Ambulatory Visit: Payer: Managed Care, Other (non HMO)

## 2015-04-16 ENCOUNTER — Ambulatory Visit
Admission: RE | Admit: 2015-04-16 | Discharge: 2015-04-16 | Disposition: A | Discharge status: Home / Self Care | Payer: Managed Care, Other (non HMO) | Source: Ambulatory Visit | Attending: Radiation Oncology | Admitting: Radiation Oncology

## 2015-04-16 DIAGNOSIS — Z510 Encounter for antineoplastic radiation therapy: Secondary | ICD-10-CM | POA: Diagnosis not present

## 2015-04-17 ENCOUNTER — Ambulatory Visit: Payer: Managed Care, Other (non HMO)

## 2015-04-17 DIAGNOSIS — R972 Elevated prostate specific antigen [PSA]: Secondary | ICD-10-CM | POA: Insufficient documentation

## 2015-04-17 DIAGNOSIS — M1990 Unspecified osteoarthritis, unspecified site: Secondary | ICD-10-CM | POA: Insufficient documentation

## 2015-04-17 DIAGNOSIS — M47812 Spondylosis without myelopathy or radiculopathy, cervical region: Secondary | ICD-10-CM | POA: Insufficient documentation

## 2015-04-17 DIAGNOSIS — N529 Male erectile dysfunction, unspecified: Secondary | ICD-10-CM | POA: Insufficient documentation

## 2015-04-18 ENCOUNTER — Ambulatory Visit
Admission: RE | Admit: 2015-04-18 | Discharge: 2015-04-18 | Disposition: A | Discharge status: Home / Self Care | Payer: Managed Care, Other (non HMO) | Source: Ambulatory Visit | Attending: Radiation Oncology | Admitting: Radiation Oncology

## 2015-04-18 ENCOUNTER — Ambulatory Visit: Payer: Managed Care, Other (non HMO)

## 2015-04-18 ENCOUNTER — Ambulatory Visit (HOSPITAL_COMMUNITY)
Admission: RE | Admit: 2015-04-18 | Discharge: 2015-04-18 | Disposition: A | Discharge status: Home / Self Care | Payer: Managed Care, Other (non HMO) | Source: Ambulatory Visit | Attending: Urology | Admitting: Urology

## 2015-04-18 DIAGNOSIS — I10 Essential (primary) hypertension: Secondary | ICD-10-CM | POA: Diagnosis not present

## 2015-04-18 DIAGNOSIS — C61 Malignant neoplasm of prostate: Secondary | ICD-10-CM | POA: Diagnosis present

## 2015-04-18 DIAGNOSIS — Z01818 Encounter for other preprocedural examination: Secondary | ICD-10-CM | POA: Diagnosis not present

## 2015-04-19 ENCOUNTER — Encounter: Payer: Self-pay | Admitting: Radiation Oncology

## 2015-04-19 ENCOUNTER — Ambulatory Visit
Admission: RE | Admit: 2015-04-19 | Discharge: 2015-04-19 | Disposition: A | Discharge status: Home / Self Care | Payer: Managed Care, Other (non HMO) | Source: Ambulatory Visit | Attending: Radiation Oncology | Admitting: Radiation Oncology

## 2015-04-19 ENCOUNTER — Ambulatory Visit: Payer: Managed Care, Other (non HMO)

## 2015-04-19 VITALS — BP 159/75 | HR 51 | Temp 97.8°F | Ht 75.5 in | Wt 258.1 lb

## 2015-04-19 DIAGNOSIS — C61 Malignant neoplasm of prostate: Secondary | ICD-10-CM | POA: Insufficient documentation

## 2015-04-19 DIAGNOSIS — Z510 Encounter for antineoplastic radiation therapy: Secondary | ICD-10-CM | POA: Diagnosis not present

## 2015-04-19 NOTE — Patient Instructions (Signed)
Contact our office if you have any questions following today's appointment: [phone].  

## 2015-04-19 NOTE — Progress Notes (Signed)
  Radiation Oncology         [phone]   Name: Andrew Stevenson MRN: [id number]   Date: 04/19/2015  DOB: Aug 11, 1945     Weekly Radiation Therapy Management  No diagnosis found.  Current Dose:  41.4 Gy  Planned Dose:  45 Gy  Narrative The patient presents for routine under treatment assessment.  Andrew Stevenson has received 23 fractions to his pelvis. He reports that when he voids frequently with urgency intermittently and usually he empties completely. Notes blood on tissue after bowel movements with "pretty loose stools". He notes more fatigue for the past 2-3 weeks. He had a Chest X-ray yesterday. The patient will complete external radiotherapy next week. He's scheduled for seed implant on 05/17/15.  The patient is without complaint. Set-up films were reviewed. The chart was checked.  Physical Findings  height is 6' 3.5" (1.918 m) and weight is 258 lb 1.6 oz (117.073 kg). His temperature is 97.8 F (36.6 C). His blood pressure is 159/75 and his pulse is 51.      This is a well-appearing Caucasian male in no acute distress. Cardiopulmonary assessment is negative for acute distress. Normal effort is exhibited.  Impression The patient is tolerating radiation.  Plan Continue treatment as planned. We will continue to follow up with his symptoms and he will keep [place] informed if any become intolerable. The patient is scheduled for a prostate seed implant on 05/17/15.    The above documentation reflects my direct findings during this shared patient visit. Please see the separate note by Dr. Tammi Klippel on this date for the remainder of the patient's plan of care.  Carola Rhine, PAC  This document serves as a record of services personally performed by Tyler Pita, MD and Shona Simpson, PA-C. It was created on his behalf by Arlyce Harman, a trained medical scribe. The creation of this record is based on the scribe's personal observations and the provider's statements to them. This document has  been checked and approved by the attending provider.

## 2015-04-19 NOTE — Progress Notes (Signed)
Andrew Stevenson has received 23 fractions to his pelvis.Marland Kitchen He reports that when he voids frequently with urgency intermittently and usually he empties completely.  Notes blood on tissue after bowel movements with "pretty loose stools".  He notes more fatigue for the past 2-3 weeks.

## 2015-04-22 ENCOUNTER — Ambulatory Visit: Payer: Managed Care, Other (non HMO)

## 2015-04-22 ENCOUNTER — Ambulatory Visit
Admission: RE | Admit: 2015-04-22 | Discharge: 2015-04-22 | Disposition: A | Discharge status: Home / Self Care | Payer: Managed Care, Other (non HMO) | Source: Ambulatory Visit | Attending: Radiation Oncology | Admitting: Radiation Oncology

## 2015-04-22 DIAGNOSIS — Z510 Encounter for antineoplastic radiation therapy: Secondary | ICD-10-CM | POA: Insufficient documentation

## 2015-04-22 DIAGNOSIS — C61 Malignant neoplasm of prostate: Secondary | ICD-10-CM | POA: Diagnosis present

## 2015-04-23 ENCOUNTER — Ambulatory Visit: Payer: Managed Care, Other (non HMO)

## 2015-04-23 ENCOUNTER — Encounter: Payer: Self-pay | Admitting: Radiation Oncology

## 2015-04-23 ENCOUNTER — Ambulatory Visit: Admission: RE | Admit: 2015-04-23 | Payer: Managed Care, Other (non HMO) | Source: Ambulatory Visit

## 2015-04-23 ENCOUNTER — Ambulatory Visit
Admission: RE | Admit: 2015-04-23 | Discharge: 2015-04-23 | Disposition: A | Discharge status: Home / Self Care | Payer: Managed Care, Other (non HMO) | Source: Ambulatory Visit | Attending: Radiation Oncology | Admitting: Radiation Oncology

## 2015-04-23 DIAGNOSIS — Z510 Encounter for antineoplastic radiation therapy: Secondary | ICD-10-CM | POA: Diagnosis not present

## 2015-05-05 NOTE — Progress Notes (Signed)
  Radiation Oncology         [phone] ________________________________  Name: Andrew Stevenson MRN: [id number]  Date: 04/23/2015  DOB: 1945/06/08  End of Treatment Note    ICD-9-CM ICD-10-CM    1. Malignant neoplasm of prostate ([place]) 185 C61     DIAGNOSIS: 70 y.o. gentleman with stage T1c adenocarcinoma of the prostate with a Gleason's score of 4+3 and a PSA of 5.26.     Indication for treatment:  Curative, Prostate Radiotherapy followed by seed boost  Radiation treatment dates:   03/12/2015-04/23/2015  Site/dose:   The prostate was treated to 45 Gy in 25 fractions of 1.8 Gy  Beams/energy:   The patient was treated with external radiation using 3D 4-field therapy delivering 15 MV X-rays.  Image guidance was performed with daily cone beam CT prior to each fraction to align to gold markers in the prostate and assure proper bladder and rectal fill volumes.  Immobilization was achieved with BodyFix custom mold.  Narrative: The patient tolerated radiation treatment relatively well.   The patient experienced some minor urinary irritation and modest fatigue.  Plan: The patient has completed radiation treatment. He will return for seed implant on 05/17/15     Rodman Key A. Tammi Klippel, M.D.

## 2015-05-09 ENCOUNTER — Telehealth: Payer: Self-pay | Admitting: *Deleted

## 2015-05-09 NOTE — Telephone Encounter (Signed)
CALLED PATIENT TO REMIND OF LABS FOR IMPLANT, SPOKE WITH PATIENT AND HE IS AWARE OF THIS APPT. 

## 2015-05-10 DIAGNOSIS — Z87440 Personal history of urinary (tract) infections: Secondary | ICD-10-CM | POA: Diagnosis not present

## 2015-05-10 DIAGNOSIS — Z87442 Personal history of urinary calculi: Secondary | ICD-10-CM | POA: Diagnosis not present

## 2015-05-10 DIAGNOSIS — C61 Malignant neoplasm of prostate: Secondary | ICD-10-CM | POA: Diagnosis not present

## 2015-05-10 DIAGNOSIS — F17200 Nicotine dependence, unspecified, uncomplicated: Secondary | ICD-10-CM | POA: Diagnosis not present

## 2015-05-10 DIAGNOSIS — I739 Peripheral vascular disease, unspecified: Secondary | ICD-10-CM | POA: Diagnosis not present

## 2015-05-10 DIAGNOSIS — I10 Essential (primary) hypertension: Secondary | ICD-10-CM | POA: Diagnosis not present

## 2015-05-10 LAB — COMPREHENSIVE METABOLIC PANEL
ALT: 34 U/L (ref 17–63)
AST: 29 U/L (ref 15–41)
Albumin: 3.9 g/dL (ref 3.5–5.0)
Alkaline Phosphatase: 52 U/L (ref 38–126)
Anion gap: 8 (ref 5–15)
BUN: 20 mg/dL (ref 6–20)
CO2: 22 mmol/L (ref 22–32)
Calcium: 8.7 mg/dL — ABNORMAL LOW (ref 8.9–10.3)
Chloride: 111 mmol/L (ref 101–111)
Creatinine, Ser: 1.03 mg/dL (ref 0.61–1.24)
GFR calc Af Amer: >60 mL/min (ref >60)
GFR calc non Af Amer: >60 mL/min (ref >60)
Glucose, Bld: 102 mg/dL — ABNORMAL HIGH (ref 65–99)
Potassium: 4.4 mmol/L (ref 3.5–5.1)
Sodium: 141 mmol/L (ref 135–145)
Total Bilirubin: 1.4 mg/dL — ABNORMAL HIGH (ref 0.3–1.2)
Total Protein: 6.5 g/dL (ref 6.5–8.1)

## 2015-05-10 LAB — CBC
HCT: 45.1 % (ref 39.0–52.0)
Hemoglobin: 15 g/dL (ref 13.0–17.0)
MCH: 31 pg (ref 26.0–34.0)
MCHC: 33.3 g/dL (ref 30.0–36.0)
MCV: 93.2 fL (ref 78.0–100.0)
Platelets: 134 10*3/uL — ABNORMAL LOW (ref 150–400)
RBC: 4.84 MIL/uL (ref 4.22–5.81)
RDW: 14.1 % (ref 11.5–15.5)
WBC: 4.4 10*3/uL (ref 4.0–10.5)

## 2015-05-10 LAB — PROTIME-INR
INR: 1.13 (ref 0.00–1.49)
Prothrombin Time: 14.3 seconds (ref 11.6–15.2)

## 2015-05-10 LAB — APTT: aPTT: 28 seconds (ref 24–37)

## 2015-05-15 ENCOUNTER — Ambulatory Visit: Admission: RE | Admit: 2015-05-15 | Payer: Managed Care, Other (non HMO) | Source: Ambulatory Visit

## 2015-05-15 ENCOUNTER — Encounter (HOSPITAL_BASED_OUTPATIENT_CLINIC_OR_DEPARTMENT_OTHER): Payer: Self-pay | Admitting: *Deleted

## 2015-05-15 NOTE — Progress Notes (Signed)
NPO AFTER MN.  ARRIVE AT 0800. NEEDS EKG.   CURRENT LAB RESULTS AND CXR IN CHART AND EPIC.  WILL TAKE AM MEDS W/ SIPS OF WATER DOS AND DO FLEET ENEMA.

## 2015-05-16 ENCOUNTER — Telehealth: Payer: Self-pay | Admitting: *Deleted
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
## 2015-05-16 NOTE — Telephone Encounter (Signed)
Called patient to remind of procedure for 05-17-15, spoke with patient and he is aware of this procedure.

## 2015-05-17 ENCOUNTER — Encounter (HOSPITAL_BASED_OUTPATIENT_CLINIC_OR_DEPARTMENT_OTHER): Payer: Self-pay | Admitting: Anesthesiology

## 2015-05-17 ENCOUNTER — Ambulatory Visit (HOSPITAL_BASED_OUTPATIENT_CLINIC_OR_DEPARTMENT_OTHER): Payer: Managed Care, Other (non HMO) | Admitting: Anesthesiology

## 2015-05-17 ENCOUNTER — Ambulatory Visit (HOSPITAL_BASED_OUTPATIENT_CLINIC_OR_DEPARTMENT_OTHER)
Admission: RE | Admit: 2015-05-17 | Discharge: 2015-05-17 | Disposition: A | Discharge status: Home / Self Care | Payer: Managed Care, Other (non HMO) | Source: Ambulatory Visit | Attending: Urology | Admitting: Urology

## 2015-05-17 ENCOUNTER — Encounter (HOSPITAL_BASED_OUTPATIENT_CLINIC_OR_DEPARTMENT_OTHER)
Admission: RE | Disposition: A | Discharge status: Home / Self Care | Payer: Self-pay | Source: Ambulatory Visit | Attending: Urology

## 2015-05-17 ENCOUNTER — Other Ambulatory Visit: Payer: Self-pay

## 2015-05-17 ENCOUNTER — Ambulatory Visit (HOSPITAL_COMMUNITY): Payer: Managed Care, Other (non HMO)

## 2015-05-17 DIAGNOSIS — F17200 Nicotine dependence, unspecified, uncomplicated: Secondary | ICD-10-CM | POA: Insufficient documentation

## 2015-05-17 DIAGNOSIS — Z87442 Personal history of urinary calculi: Secondary | ICD-10-CM | POA: Insufficient documentation

## 2015-05-17 DIAGNOSIS — C61 Malignant neoplasm of prostate: Secondary | ICD-10-CM | POA: Diagnosis not present

## 2015-05-17 DIAGNOSIS — Z87440 Personal history of urinary (tract) infections: Secondary | ICD-10-CM | POA: Insufficient documentation

## 2015-05-17 DIAGNOSIS — I10 Essential (primary) hypertension: Secondary | ICD-10-CM | POA: Insufficient documentation

## 2015-05-17 DIAGNOSIS — I739 Peripheral vascular disease, unspecified: Secondary | ICD-10-CM | POA: Insufficient documentation

## 2015-05-17 HISTORY — PX: RADIOACTIVE SEED IMPLANT: SHX5150

## 2015-05-17 SURGERY — INSERTION, RADIATION SOURCE, PROSTATE
Anesthesia: General | Site: Prostate

## 2015-05-17 MED ORDER — SUCCINYLCHOLINE CHLORIDE 20 MG/ML IJ SOLN
INTRAMUSCULAR | Status: DC | PRN
  Administered 2015-05-17: 100 mg via INTRAVENOUS

## 2015-05-17 MED ORDER — IOHEXOL 350 MG/ML SOLN
INTRAVENOUS | Status: DC | PRN
  Administered 2015-05-17: 7 mL

## 2015-05-17 MED ORDER — CIPROFLOXACIN IN D5W 400 MG/200ML IV SOLN
INTRAVENOUS | Status: AC
  Filled 2015-05-17: qty 200

## 2015-05-17 MED ORDER — STERILE WATER FOR IRRIGATION IR SOLN
Status: DC | PRN
  Administered 2015-05-17: 3000 mL
  Administered 2015-05-17: 500 mL

## 2015-05-17 MED ORDER — OXYCODONE HCL 5 MG PO TABS
5.0000 mg | ORAL_TABLET | Freq: Once | ORAL | Status: AC | PRN
  Administered 2015-05-17: 5 mg via ORAL
  Filled 2015-05-17: qty 1

## 2015-05-17 MED ORDER — GLYCOPYRROLATE 0.2 MG/ML IJ SOLN
INTRAMUSCULAR | Status: DC | PRN
  Administered 2015-05-17: 0.6 mg via INTRAVENOUS

## 2015-05-17 MED ORDER — ONDANSETRON HCL 4 MG/2ML IJ SOLN
INTRAMUSCULAR | Status: DC | PRN
  Administered 2015-05-17: 4 mg via INTRAVENOUS

## 2015-05-17 MED ORDER — ROCURONIUM BROMIDE 100 MG/10ML IV SOLN
INTRAVENOUS | Status: DC | PRN
  Administered 2015-05-17: 10 mg via INTRAVENOUS
  Administered 2015-05-17: 40 mg via INTRAVENOUS

## 2015-05-17 MED ORDER — ACETAMINOPHEN 10 MG/ML IV SOLN
INTRAVENOUS | Status: DC | PRN
  Administered 2015-05-17: 1000 mg via INTRAVENOUS

## 2015-05-17 MED ORDER — EPHEDRINE SULFATE 50 MG/ML IJ SOLN
INTRAMUSCULAR | Status: DC | PRN
  Administered 2015-05-17 (×4): 10 mg via INTRAVENOUS

## 2015-05-17 MED ORDER — ARTIFICIAL TEARS OP OINT
TOPICAL_OINTMENT | OPHTHALMIC | Status: AC
  Filled 2015-05-17: qty 3.5

## 2015-05-17 MED ORDER — NEOSTIGMINE METHYLSULFATE 10 MG/10ML IV SOLN
INTRAVENOUS | Status: AC
  Filled 2015-05-17: qty 1

## 2015-05-17 MED ORDER — EPHEDRINE SULFATE 50 MG/ML IJ SOLN
INTRAMUSCULAR | Status: AC
  Filled 2015-05-17: qty 1

## 2015-05-17 MED ORDER — ROCURONIUM BROMIDE 100 MG/10ML IV SOLN
INTRAVENOUS | Status: AC
  Filled 2015-05-17: qty 1

## 2015-05-17 MED ORDER — MIDAZOLAM HCL 5 MG/5ML IJ SOLN
INTRAMUSCULAR | Status: DC | PRN
  Administered 2015-05-17: 1 mg via INTRAVENOUS

## 2015-05-17 MED ORDER — DEXAMETHASONE SODIUM PHOSPHATE 10 MG/ML IJ SOLN
INTRAMUSCULAR | Status: AC
  Filled 2015-05-17: qty 1

## 2015-05-17 MED ORDER — HYDROCODONE-ACETAMINOPHEN 10-325 MG PO TABS: 1.0000 | ORAL_TABLET | ORAL | Status: DC | PRN

## 2015-05-17 MED ORDER — LACTATED RINGERS IV SOLN
INTRAVENOUS | Status: DC
  Administered 2015-05-17 (×3): via INTRAVENOUS
  Filled 2015-05-17: qty 1000

## 2015-05-17 MED ORDER — MIDAZOLAM HCL 2 MG/2ML IJ SOLN
INTRAMUSCULAR | Status: AC
  Filled 2015-05-17: qty 2

## 2015-05-17 MED ORDER — PROPOFOL 10 MG/ML IV BOLUS
INTRAVENOUS | Status: DC | PRN
  Administered 2015-05-17: 200 mg via INTRAVENOUS

## 2015-05-17 MED ORDER — DEXAMETHASONE SODIUM PHOSPHATE 4 MG/ML IJ SOLN
INTRAMUSCULAR | Status: DC | PRN
  Administered 2015-05-17: 10 mg via INTRAVENOUS

## 2015-05-17 MED ORDER — LIDOCAINE HCL (CARDIAC) 20 MG/ML IV SOLN
INTRAVENOUS | Status: AC
  Filled 2015-05-17: qty 10

## 2015-05-17 MED ORDER — ACETAMINOPHEN 10 MG/ML IV SOLN
INTRAVENOUS | Status: AC
  Filled 2015-05-17: qty 100

## 2015-05-17 MED ORDER — ONDANSETRON HCL 4 MG/2ML IJ SOLN
4.0000 mg | Freq: Four times a day (QID) | INTRAMUSCULAR | Status: DC | PRN
  Filled 2015-05-17: qty 2

## 2015-05-17 MED ORDER — LIDOCAINE HCL (CARDIAC) 20 MG/ML IV SOLN
INTRAVENOUS | Status: DC | PRN
  Administered 2015-05-17: 100 mg via INTRAVENOUS

## 2015-05-17 MED ORDER — GLYCOPYRROLATE 0.2 MG/ML IJ SOLN
INTRAMUSCULAR | Status: AC
  Filled 2015-05-17: qty 3

## 2015-05-17 MED ORDER — FLEET ENEMA 7-19 GM/118ML RE ENEM
1.0000 | ENEMA | Freq: Once | RECTAL | Status: DC
  Filled 2015-05-17: qty 1

## 2015-05-17 MED ORDER — CIPROFLOXACIN IN D5W 400 MG/200ML IV SOLN
400.0000 mg | INTRAVENOUS | Status: AC
  Administered 2015-05-17: 400 mg via INTRAVENOUS
  Filled 2015-05-17: qty 200

## 2015-05-17 MED ORDER — PROPOFOL 10 MG/ML IV BOLUS
INTRAVENOUS | Status: AC
  Filled 2015-05-17: qty 40

## 2015-05-17 MED ORDER — FENTANYL CITRATE (PF) 100 MCG/2ML IJ SOLN
INTRAMUSCULAR | Status: AC
  Filled 2015-05-17: qty 2

## 2015-05-17 MED ORDER — ONDANSETRON HCL 4 MG/2ML IJ SOLN
INTRAMUSCULAR | Status: AC
  Filled 2015-05-17: qty 2

## 2015-05-17 MED ORDER — OXYCODONE HCL 5 MG/5ML PO SOLN
5.0000 mg | Freq: Once | ORAL | Status: AC | PRN
  Filled 2015-05-17: qty 5

## 2015-05-17 MED ORDER — OXYCODONE HCL 5 MG PO TABS
ORAL_TABLET | ORAL | Status: AC
  Filled 2015-05-17: qty 1

## 2015-05-17 MED ORDER — HYDROMORPHONE HCL 1 MG/ML IJ SOLN
0.2500 mg | INTRAMUSCULAR | Status: DC | PRN
  Filled 2015-05-17: qty 1

## 2015-05-17 MED ORDER — LIDOCAINE HCL 4 % EX SOLN
CUTANEOUS | Status: DC | PRN
  Administered 2015-05-17: 3 mL via TOPICAL

## 2015-05-17 MED ORDER — FENTANYL CITRATE (PF) 100 MCG/2ML IJ SOLN
INTRAMUSCULAR | Status: DC | PRN
  Administered 2015-05-17 (×4): 50 ug via INTRAVENOUS

## 2015-05-17 MED ORDER — NEOSTIGMINE METHYLSULFATE 10 MG/10ML IV SOLN
INTRAVENOUS | Status: DC | PRN
  Administered 2015-05-17: 4 mg via INTRAVENOUS

## 2015-05-17 MED ORDER — CIPROFLOXACIN HCL 500 MG PO TABS: 500.0000 mg | ORAL_TABLET | Freq: Two times a day (BID) | ORAL | Status: DC

## 2015-05-17 SURGICAL SUPPLY — 31 items
BAG URINE DRAINAGE (UROLOGICAL SUPPLIES) ×2 IMPLANT
BLADE CLIPPER SURG (BLADE) ×2 IMPLANT
CATH FOLEY 2WAY SLVR  5CC 16FR (CATHETERS) ×2
CATH FOLEY 2WAY SLVR 5CC 16FR (CATHETERS) ×2 IMPLANT
CATH ROBINSON RED A/P 20FR (CATHETERS) ×2 IMPLANT
CLOTH BEACON ORANGE TIMEOUT ST (SAFETY) ×2 IMPLANT
COVER BACK TABLE 60X90IN (DRAPES) ×2 IMPLANT
COVER MAYO STAND STRL (DRAPES) ×2 IMPLANT
DRSG TEGADERM 4X4.75 (GAUZE/BANDAGES/DRESSINGS) ×2 IMPLANT
DRSG TEGADERM 8X12 (GAUZE/BANDAGES/DRESSINGS) ×2 IMPLANT
GLOVE BIO SURGEON STRL SZ 6.5 (GLOVE) ×1 IMPLANT
GLOVE BIO SURGEON STRL SZ8 (GLOVE) ×4 IMPLANT
GLOVE ECLIPSE 8.0 STRL XLNG CF (GLOVE) ×4 IMPLANT
GLOVE INDICATOR 6.5 STRL GRN (GLOVE) ×1 IMPLANT
GOWN STRL REUS W/ TWL LRG LVL3 (GOWN DISPOSABLE) IMPLANT
GOWN STRL REUS W/ TWL XL LVL3 (GOWN DISPOSABLE) ×1 IMPLANT
GOWN STRL REUS W/TWL LRG LVL3 (GOWN DISPOSABLE) ×2
GOWN STRL REUS W/TWL XL LVL3 (GOWN DISPOSABLE) ×2
GOWN XL W/COTTON TOWEL STD (GOWNS) ×1 IMPLANT
HOLDER FOLEY CATH W/STRAP (MISCELLANEOUS) ×2 IMPLANT
IV NS IRRIG 3000ML ARTHROMATIC (IV SOLUTION) IMPLANT
IV WATER IRR. 1000ML (IV SOLUTION) ×1 IMPLANT
KIT ROOM TURNOVER WOR (KITS) ×2 IMPLANT
PACK CYSTO (CUSTOM PROCEDURE TRAY) ×2 IMPLANT
SPONGE GAUZE 4X4 12PLY STER LF (GAUZE/BANDAGES/DRESSINGS) ×2 IMPLANT
SYRINGE 10CC LL (SYRINGE) ×2 IMPLANT
TUBE CONNECTING 12X1/4 (SUCTIONS) IMPLANT
UNDERPAD 30X30 INCONTINENT (UNDERPADS AND DIAPERS) ×4 IMPLANT
WATER STERILE IRR 3000ML UROMA (IV SOLUTION) ×1 IMPLANT
WATER STERILE IRR 500ML POUR (IV SOLUTION) ×2 IMPLANT
radioactive seed ×118 IMPLANT

## 2015-05-17 NOTE — Op Note (Signed)
PATIENT:  [name]  PRE-OPERATIVE DIAGNOSIS:  Adenocarcinoma of the prostate  POST-OPERATIVE DIAGNOSIS:  Same  PROCEDURE:  Procedure(s): 1. I-125 radioactive seed implantation 2. Cystoscopy  SURGEON:  Surgeon(s): Claybon Jabs  Radiation oncologist: Dr. Tyler Pita  ANESTHESIA:  General  EBL:  Minimal  DRAINS: 82 French Foley catheter  INDICATION: Andrew Stevenson is a 70 year old male with biopsy-proven adenocarcinoma of the prostate. He has undergone external radiation and presents today for I-125 radioactive seed boost.  Description of procedure: After informed consent the patient was brought to the major OR, placed on the table and administered general anesthesia. He was then moved to the modified lithotomy position with his perineum perpendicular to the floor. His perineum and genitalia were then sterilely prepped. An official timeout was then performed. A 16 French Foley catheter was then placed in the bladder and filled with dilute contrast, a rectal tube was placed in the rectum and the transrectal ultrasound probe was placed in the rectum and affixed to the stand. He was then sterilely draped.  Real time ultrasonography was used along with the seed planning software [employer] Prostate vs. 4.2.21. This was used to develop the seed plan including the number of needles as well as number of seeds required for complete and adequate coverage. Real-time ultrasonography was then used along with the previously developed plan and the Nucletron device to implant a total of 59 seeds using 19 needles. This proceeded without difficulty or complication.  A Foley catheter was then removed as well as the transrectal ultrasound probe and rectal probe. Flexible cystoscopy was then performed using the 17 French flexible scope which revealed a normal urethra throughout its length down to the sphincter which appeared intact. The prostatic urethra revealed bilobar hypertrophy but no evidence of  obstruction, seeds, spacers or lesions. The bladder was then entered and fully and systematically inspected. The ureteral orifices were noted to be of normal configuration and position. The mucosa revealed no evidence of tumors. There were also no stones identified within the bladder. I noted no seeds were seen on the floor of the bladder and retroflexion of the scope revealed no seeds protruding from the base of the prostate. I did note a single spacer on the floor of the bladder and this was grasped with alligator forceps and extracted without difficulty.  The cystoscope was then removed and a new 62 French Foley catheter was then inserted and the balloon was filled with 10 cc of sterile water. This was connected to closed system drainage and the patient was awakened and taken to recovery room in stable and satisfactory condition. He tolerated procedure well and there were no intraoperative complications.

## 2015-05-17 NOTE — Anesthesia Postprocedure Evaluation (Signed)
Anesthesia Post Note  Patient: Andrew Stevenson  Procedure(s) Performed: Procedure(s) (LRB): RADIOACTIVE SEED IMPLANT/BRACHYTHERAPY IMPLANT (N/A)  Patient location during evaluation: PACU Anesthesia Type: General Level of consciousness: awake and alert and patient cooperative Pain management: pain level controlled Vital Signs Assessment: post-procedure vital signs reviewed and stable Respiratory status: spontaneous breathing and respiratory function stable Cardiovascular status: stable Anesthetic complications: no    Last Vitals:  Filed Vitals:   05/17/15 1209 05/17/15 1215  BP:  177/75  Pulse: 60 59  Temp:    Resp: 15 14    Last Pain: There were no vitals filed for this visit.               [place]

## 2015-05-17 NOTE — Discharge Instructions (Signed)
DISCHARGE INSTRUCTIONS FOR PROSTATE SEED IMPLANTATION  Removal of catheter Remove the foley catheter after 24 hours ( day after the procedure).can be done easily by cutting the side port of the catheter, which will allow the balloon to deflate.  You will see 1-2 teaspoons of clear water as the balloon deflates and then the catheter can be slid out without difficulty.        Cut here  Antibiotics You may be given a prescription for an antibiotic to take when you arrive home. If so, be sure to take every tablet in the bottle, even if you are feeling better before the prescription is finished. If you begin itching, notice a rash or start to swell on your trunk, arms, legs and/or throat, immediately stop taking the antibiotic and call your Urologist. Diet Resume your usual diet when you return home. To keep your bowels moving easily and softly, drink prune, apple and cranberry juice at room temperature. You may also take a stool softener, such as Colace, which is available without prescription at local pharmacies. Daily activities ? No driving or heavy lifting for at least two days after the implant. ? No bike riding, horseback riding or riding lawn mowers for the first month after the implant. ? Any strenuous physical activity should be approved by your doctor before you resume it. Sexual relations You may resume sexual relations two weeks after the procedure. A condom should be used for the first two weeks. Your semen may be dark brown or black; this is normal and is related bleeding that may have occurred during the implant. Postoperative swelling Expect swelling and bruising of the scrotum and perineum (the area between the scrotum and anus). Both the swelling and the bruising should resolve in l or 2 weeks. Ice packs and over- the-counter medications such as Tylenol, Advil or Aleve may lessen your discomfort. Postoperative urination Most men experience burning on urination and/or urinary  frequency. If this becomes bothersome, contact your Urologist.  Medication can be prescribed to relieve these problems.  It is normal to have some blood in your urine for a few days after the implant. Special instructions related to the seeds It is unlikely that you will pass an Iodine-125 seed in your urine. The seeds are silver in color and are about as large as a grain of rice. If you pass a seed, do not handle it with your fingers. Use a spoon to place it in an envelope or jar in place this in base occluded area such as the garage or basement for return to the radiation clinic at your convenience.  Contact your doctor for ? Temperature greater than 101 F ? Increasing pain ? Inability to urinate Follow-up  You should have follow up with your urologist and radiation oncologist about 3 weeks after the procedure. General information regarding Iodine seeds ? Iodine-125 is a low energy radioactive material. It is not deeply penetrating and loses energy at short distances. Your prostate will absorb the radiation. Objects that are touched or used by the patient do not become radioactive. ? Body wastes (urine and stool) or body fluids (saliva, tears, semen or blood) are not radioactive. ? The Nuclear Regulatory Commission [facility]) has determined that no radiation precautions are needed for patients undergoing Iodine-125 seed implantation. The [facility] states that such patients do not present a risk to the people around them, including young children and pregnant women. However, in keeping with the general principle that radiation exposure should be kept as  low reasonably possible, we suggest the following: ? Children and pets should not sit on the patient's lap for the first two (2) weeks after the implant. ? Pregnant (or possibly pregnant) women should avoid prolonged, close contact with the patient for the first two (2) weeks after the implant. ? A distance of three (3) feet is acceptable. ? At a distance of  three (3) feet, there is no limit to the length of time anyone can be with the patient.   Post Anesthesia Home Care Instructions  Activity: Get plenty of rest for the remainder of the day. A responsible adult should stay with you for 24 hours following the procedure.  For the next 24 hours, DO NOT: -Drive a car -Paediatric nurse -Drink alcoholic beverages -Take any medication unless instructed by your physician -Make any legal decisions or sign important papers.  Meals: Start with liquid foods such as gelatin or soup. Progress to regular foods as tolerated. Avoid greasy, spicy, heavy foods. If nausea and/or vomiting occur, drink only clear liquids until the nausea and/or vomiting subsides. Call your physician if vomiting continues.  Special Instructions/Symptoms: Your throat may feel dry or sore from the anesthesia or the breathing tube placed in your throat during surgery. If this causes discomfort, gargle with warm salt water. The discomfort should disappear within 24 hours.  If you had a scopolamine patch placed behind your ear for the management of post- operative nausea and/or vomiting:  1. The medication in the patch is effective for 72 hours, after which it should be removed.  Wrap patch in a tissue and discard in the trash. Wash hands thoroughly with soap and water. 2. You may remove the patch earlier than 72 hours if you experience unpleasant side effects which may include dry mouth, dizziness or visual disturbances. 3. Avoid touching the patch. Wash your hands with soap and water after contact with the patch.

## 2015-05-17 NOTE — Interval H&P Note (Signed)
History and Physical Interval Note:  05/17/2015 6:32 AM  Andrew Stevenson  has presented today for surgery, with the diagnosis of PROSTATE CANCER  The various methods of treatment have been discussed with the patient and family. After consideration of risks, benefits and other options for treatment, the patient has consented to  Procedure(s): RADIOACTIVE SEED IMPLANT/BRACHYTHERAPY IMPLANT (N/A) as a surgical intervention .  The patient's history has been reviewed, patient examined, no change in status, stable for surgery.  I have reviewed the patient's chart and labs.  Questions were answered to the patient's satisfaction.     Claybon Jabs

## 2015-05-17 NOTE — Transfer of Care (Signed)
  Last Vitals:  Filed Vitals:   05/17/15 0825  BP: 156/83  Pulse: 50  Temp: 36.4 C  Resp: 16    Immediate Anesthesia Transfer of Care Note  Patient: Andrew Stevenson  Procedure(s) Performed: Procedure(s) (LRB): RADIOACTIVE SEED IMPLANT/BRACHYTHERAPY IMPLANT (N/A)  Patient Location: PACU  Anesthesia Type: General  Level of Consciousness: awake, alert  and oriented  Airway & Oxygen Therapy: Patient Spontanous Breathing and Patient connected to nasal cannula oxygen  Post-op Assessment: Report given to PACU RN and Post -op Vital signs reviewed and stable  Post vital signs: Reviewed and stable  Complications: No apparent anesthesia complications

## 2015-05-17 NOTE — Anesthesia Procedure Notes (Signed)
Procedure Name: Intubation Date/Time: 05/17/2015 9:47 AM Performed by: Mechele Claude Pre-anesthesia Checklist: Patient identified, Emergency Drugs available, Suction available and Patient being monitored Patient Re-evaluated:Patient Re-evaluated prior to inductionOxygen Delivery Method: Circle System Utilized Preoxygenation: Pre-oxygenation with 100% oxygen Intubation Type: IV induction Ventilation: Mask ventilation without difficulty Laryngoscope Size: Mac and 4 Grade View: Grade III Tube type: Oral Tube size: 8.0 mm Number of attempts: 1 Airway Equipment and Method: LTA kit utilized and Stylet Placement Confirmation: positive ETCO2,  ETT inserted through vocal cords under direct vision and breath sounds checked- equal and bilateral Secured at: 23 cm Tube secured with: Tape Dental Injury: Teeth and Oropharynx as per pre-operative assessment  Comments: Only able to visualize the bottom of the cords with cricoid pressure.

## 2015-05-17 NOTE — Anesthesia Preprocedure Evaluation (Signed)
Anesthesia Evaluation  Patient identified by MRN, date of birth, ID band Patient awake    Reviewed: Allergy & Precautions, NPO status , Patient's Chart, lab work & pertinent test results  Airway Mallampati: II   Neck ROM: full    Dental   Pulmonary Current Smoker,    breath sounds clear to auscultation       Cardiovascular hypertension, + Peripheral Vascular Disease   Rhythm:regular Rate:Normal     Neuro/Psych  Neuromuscular disease    GI/Hepatic   Endo/Other  obese  Renal/GU      Musculoskeletal  (+) Arthritis ,   Abdominal   Peds  Hematology   Anesthesia Other Findings   Reproductive/Obstetrics                             Anesthesia Physical Anesthesia Plan  ASA: III  Anesthesia Plan: General   Post-op Pain Management:    Induction: Intravenous  Airway Management Planned: Oral ETT  Additional Equipment:   Intra-op Plan:   Post-operative Plan: Extubation in OR  Informed Consent: I have reviewed the patients History and Physical, chart, labs and discussed the procedure including the risks, benefits and alternatives for the proposed anesthesia with the patient or authorized representative who has indicated his/her understanding and acceptance.     Plan Discussed with: CRNA, Anesthesiologist and Surgeon  Anesthesia Plan Comments:         Anesthesia Quick Evaluation

## 2015-05-20 ENCOUNTER — Encounter (HOSPITAL_BASED_OUTPATIENT_CLINIC_OR_DEPARTMENT_OTHER): Payer: Self-pay | Admitting: Urology

## 2015-05-26 NOTE — Progress Notes (Signed)
  Radiation Oncology         [phone] ________________________________  Name: Andrew Stevenson MRN: [id number]  Date: 05/26/2015  DOB: 11-Jun-1945       Prostate Seed Implant  [id number] A, MD  No ref. provider found  DIAGNOSIS: 70 y.o. gentleman with stage T1c adenocarcinoma of the prostate with a Gleason's score of 4+3 and a PSA of 5.26    ICD-9-CM ICD-10-CM   1. Prostate cancer ([place]) 185 C61 DG Chest 2 View     DG Chest 2 View     Discharge patient  2. PC (prostate cancer) ([place]) 185 C61 DG Abd 1 View     DG Abd 1 View    PROCEDURE: Insertion of radioactive I-125 seeds into the prostate gland.  RADIATION DOSE: 110 Gy, boost therapy.  TECHNIQUE: Andrew Stevenson was brought to the operating room with the urologist. He was placed in the dorsolithotomy position. He was catheterized and a rectal tube was inserted. The perineum was shaved, prepped and draped. The ultrasound probe was then introduced into the rectum to see the prostate gland.  TREATMENT DEVICE: A needle grid was attached to the ultrasound probe stand and anchor needles were placed.  3D PLANNING: The prostate was imaged in 3D using a sagittal sweep of the prostate probe. These images were transferred to the planning computer. There, the prostate, urethra and rectum were defined on each axial reconstructed image. Then, the software created an optimized 3D plan and a few seed positions were adjusted. The quality of the plan was reviewed using [facility] information for the target and the following two organs at risk:  Urethra and Rectum.  Then the accepted plan was uploaded to the seed Selectron afterloading unit.  PROSTATE VOLUME STUDY:  Using transrectal ultrasound the volume of the prostate was verified to be 44.3 cc.  SPECIAL TREATMENT PROCEDURE/SUPERVISION AND HANDLING: The Nucletron FIRST system was used to place the needles under sagittal guidance. A total of 19 needles were used to deposit 59 seeds in the prostate gland.  The individual seed activity was 0.459 mCi.  COMPLEX SIMULATION: At the end of the procedure, an anterior radiograph of the pelvis was obtained to document seed positioning and count. Cystoscopy was performed to check the urethra and bladder.  MICRODOSIMETRY: At the end of the procedure, the patient was emitting 0.05 mR/hr at 1 meter. Accordingly, he was considered safe for hospital discharge.  PLAN: The patient will return to the radiation oncology clinic for post implant CT dosimetry in three weeks.   ________________________________  Sheral Apley Tammi Klippel, M.D.

## 2015-06-12 ENCOUNTER — Telehealth: Payer: Self-pay | Admitting: *Deleted

## 2015-06-12 NOTE — Telephone Encounter (Signed)
Called patient to remind of post seed appts. For 06-13-15, lvm for a return call

## 2015-06-13 ENCOUNTER — Encounter: Payer: Self-pay | Admitting: Radiation Oncology

## 2015-06-13 ENCOUNTER — Ambulatory Visit
Admission: RE | Admit: 2015-06-13 | Discharge: 2015-06-13 | Disposition: A | Discharge status: Home / Self Care | Payer: Managed Care, Other (non HMO) | Source: Ambulatory Visit | Attending: Radiation Oncology | Admitting: Radiation Oncology

## 2015-06-13 VITALS — BP 144/70 | HR 49 | Temp 97.5°F | Ht 75.5 in | Wt 254.9 lb

## 2015-06-13 DIAGNOSIS — Z510 Encounter for antineoplastic radiation therapy: Secondary | ICD-10-CM | POA: Diagnosis present

## 2015-06-13 DIAGNOSIS — C61 Malignant neoplasm of prostate: Secondary | ICD-10-CM

## 2015-06-13 NOTE — Addendum Note (Signed)
Encounter addended by: Benn Moulder, RN on: 06/13/2015  2:24 PM<BR>     Documentation filed: Arn Medal VN

## 2015-06-13 NOTE — Progress Notes (Signed)
Radiation Oncology         [phone]) [phone] ________________________________  Name: Andrew Stevenson MRN: [id number]  Date: 06/13/2015  DOB: 03-03-1946  Follow-Up Visit Note  CC: Stephens Shire, MD  Kathie Rhodes, MD  Diagnosis:  Stage T1c adenocarcinoma of the prostate with a Gleason's score of 4+3 and a PSA of 5.26    ICD-9-CM ICD-10-CM   1. Malignant neoplasm of prostate ([place]) 185 C61     Interval Since Last Radiation:  27  days  IMRT: 03/12/15-04/23/15 the patient received 45 Gy to the prostate in 25 fractions. Seed Boost: 05/17/15 the patient received 59 seeds to the prostate to total 110 Gy Boost.   Narrative:  The patient returns today for routine follow-up. He did undergo seed implant boost on 05/17/2015 comes today for follow-up. Interestingly, his IPSS score the same as it was prior to initiating to the external beam radiation therapy back in December 2016; 19 which scores this as moderate.  On review of systems, the patient reports that he is going to be following up with urology in about 2 weeks time. He has had some episodes of loose bowel movements 3 times a day with occasional incontinence up to once or twice a day. He describes a small amount of blood in his stool, and intermittent stinging with urinating he feels as though his urinary stream is getting stronger, but he does have a sense of incomplete emptying as well. He continues to have nocturia 2 but again attributes this to drinking water in the evening, and goes to sleep around 1 AM. He denies any abdominal pain, nausea, vomiting, fevers or chills he is not experiencing any chest pain or shortness of breath. A complete review of systems is obtained and is otherwise negative.  ALLERGIES:  is allergic to penicillins.  Meds: Current Outpatient Prescriptions  Medication Sig Dispense Refill  . aspirin EC 81 MG tablet Take 81 mg by mouth daily.    Marland Kitchen b complex vitamins capsule Take 1 capsule by mouth daily.    Marland Kitchen buPROPion  (WELLBUTRIN SR) 150 MG 12 hr tablet Take 150 mg by mouth 2 (two) times daily.    . metoprolol tartrate (LOPRESSOR) 25 MG tablet TAKE 1 (ONE) TABLET BY MOUTH TWO TIMES DAILY  1  . Multiple Vitamin (MULTIVITAMIN) tablet Take 1 tablet by mouth daily.    Marland Kitchen rOPINIRole (REQUIP) 0.5 MG tablet Take 0.5 mg by mouth 2 (two) times daily.     . sertraline (ZOLOFT) 25 MG tablet Take 25 mg by mouth 2 (two) times daily.  5  . Turmeric 500 MG TABS Take 1 tablet by mouth daily.    . valsartan (DIOVAN) 160 MG tablet Take 160 mg by mouth every morning.     No current facility-administered medications for this encounter.    Physical Findings:  height is 6' 3.5" (1.918 m) and weight is 254 lb 14.4 oz (115.622 kg). His temperature is 97.5 F (36.4 C). His blood pressure is 144/70 and his pulse is 49.   Pain scale 0/10 In general this is a well appearing Caucasian male in no acute distress. He's alert and oriented x4 and appropriate throughout the examination. Cardiopulmonary assessment is negative for acute distress and he exhibits normal effort.    Radiographic Findings:  Patient underwent CT imaging in our clinic for post implant dosimetry. The CT appears to demonstrate an adequate distribution of radioactive seeds throughout the prostate gland. There no seeds in her near the rectum. We  anticipate the final radiation plan and dosimetry will show appropriate coverage of the prostate gland.   Impression/Plan: 1. Stage T1c adenocarcinoma of the prostate with a Gleason's score of 4+3 and a PSA of 5.26, s/p XRT with 45 Gy to the prostate, followed by Radioactive Seed Implant Boost with 110 Gy. The patient appears to be doing very well and tolerating the side effects of his radiotherapy. Discussed that the acute effects should continue to resolve over a matter of 2 months. The patient states agreement and understanding of this. He will be following up in 2 weeks time with urology for assessment and we did discuss that  his PSA was rechecked by urology and that we would follow these expectantly as well. 2. Radiation proctitis. The patient was offered a prescription for proximal distal flap or [place] from. He is not interested in using any medication preparations and this route, he would like to watch this expectantly. He does understand contact us if this becomes more frequent, or he acutely has bleeding that is not controlled. States agreement and understanding. 3. Survivorship. The patient was offered a referral source survivorship clinic, though at this time he is not interested and will follow with urology. I did enco UL8 urage him to call us if he changes his mind regarding this issue which I would be happy to correlate.     Carola Rhine, [facility] The patient was seen with, and dictated for [facility] Tammi Klippel, MD  This document serves as a record of services personally performed by Shona Simpson, PAC and Dr. Tammi Klippel. It was created on their behalf by Lendon Collar, a trained medical scribe. The creation of this record is based on the scribe's personal observations and the provider's statements to them. This document has been checked and approved by the attending provider.

## 2015-06-13 NOTE — Progress Notes (Signed)
Andrew Stevenson here for reassessment s/p prostate seed implant.  He reports intermittent stinging when voiding with some hesitancy in stream, but reports complete emptying. Nocturia 2.  Reports blood on tissue when wiping during defecation and note "water is red in the commode - intermittent in nature. Bowel incontinence, watery in consistency.

## 2015-06-13 NOTE — Progress Notes (Signed)
  Radiation Oncology         [phone] ________________________________  Name: Andrew Stevenson  MRN: [id number]  Date: 06/13/2015  DOB: October 29, 1945  COMPLEX SIMULATION NOTE  NARRATIVE:  The patient was brought to the [place] today following prostate seed implantation approximately one month ago.  Identity was confirmed.  All relevant records and images related to the planned course of therapy were reviewed.  Then, the patient was set-up supine.  CT images were obtained.  The CT images were loaded into the planning software.  Then the prostate and rectum were contoured.  Treatment planning then occurred.  The implanted iodine 125 seeds were identified by the physics staff for projection of radiation distribution  I have requested : 3D Simulation  I have requested a DVH of the following structures: Prostate and rectum.    ________________________________  Sheral Apley Tammi Klippel, M.D.    This document serves as a record of services personally performed by Tyler Pita, MD. It was created on his behalf by Lendon Collar, a trained medical scribe. The creation of this record is based on the scribe's personal observations and the provider's statements to them. This document has been checked and approved by the attending provider.

## 2015-06-17 ENCOUNTER — Encounter: Payer: Self-pay | Admitting: Radiation Oncology

## 2015-06-17 DIAGNOSIS — Z510 Encounter for antineoplastic radiation therapy: Secondary | ICD-10-CM | POA: Diagnosis not present

## 2015-06-23 NOTE — Progress Notes (Signed)
  Radiation Oncology         [phone] ________________________________  Name: Andrew Stevenson MRN: [id number]  Date: 06/17/2015  DOB: 1945-08-08  3D Planning Note   Prostate Brachytherapy Post-Implant Dosimetry  Diagnosis: 70 y.o. gentleman with stage T1c adenocarcinoma of the prostate with a Gleason's score of 4+3 and a PSA of 5.26  Narrative: On a previous date, Andrew Stevenson returned following prostate seed implantation for post implant planning. He underwent CT scan complex simulation to delineate the three-dimensional structures of the pelvis and demonstrate the radiation distribution.  Since that time, the seed localization, and complex isodose planning with dose volume histograms have now been completed.  Results:   Prostate Coverage - The dose of radiation delivered to the 90% or more of the prostate gland (D90) was 114.34% of the prescription dose. This exceeds our goal of greater than 90%. Rectal Sparing - The volume of rectal tissue receiving the prescription dose or higher was 0.0 cc. This falls under our thresholds tolerance of 1.0 cc.  Impression: The prostate seed implant appears to show adequate target coverage and appropriate rectal sparing.  Plan:  The patient will continue to follow with urology for ongoing PSA determinations. I would anticipate a high likelihood for local tumor control with minimal risk for rectal morbidity.  ________________________________  Sheral Apley Tammi Klippel, M.D.

## 2015-08-16 ENCOUNTER — Encounter: Payer: Self-pay | Admitting: Radiation Oncology

## 2016-02-04 ENCOUNTER — Encounter: Payer: Self-pay | Admitting: Family Medicine

## 2016-02-14 DIAGNOSIS — E785 Hyperlipidemia, unspecified: Secondary | ICD-10-CM

## 2016-02-14 DIAGNOSIS — R7301 Impaired fasting glucose: Secondary | ICD-10-CM

## 2016-02-14 HISTORY — DX: Hyperlipidemia, unspecified: E78.5

## 2016-02-14 HISTORY — DX: Impaired fasting glucose: R73.01

## 2016-02-26 ENCOUNTER — Encounter: Payer: Self-pay | Admitting: Family Medicine

## 2016-02-26 ENCOUNTER — Ambulatory Visit (INDEPENDENT_AMBULATORY_CARE_PROVIDER_SITE_OTHER): Payer: Managed Care, Other (non HMO) | Admitting: Family Medicine

## 2016-02-26 VITALS — BP 108/65 | HR 61 | Temp 97.7°F | Resp 16 | Ht 75.0 in | Wt 258.5 lb

## 2016-02-26 DIAGNOSIS — Z0000 Encounter for general adult medical examination without abnormal findings: Secondary | ICD-10-CM | POA: Diagnosis not present

## 2016-02-26 DIAGNOSIS — Z1159 Encounter for screening for other viral diseases: Secondary | ICD-10-CM | POA: Diagnosis not present

## 2016-02-26 NOTE — Progress Notes (Signed)
Office Note 02/26/2016  CC:  Chief Complaint  Patient presents with  . Establish Care  . Annual Exam    Pt is not fasting.    HPI:  Andrew Stevenson is a 70 y.o. White male who is here to establish care. Patient's most recent primary MD: [facility]. Old records were not reviewed prior to or during today's visit.  He desires complete physical today.  Not fasting. Relatively recent hx of prostate ca, now in remission.  At the very end of visit today he asked for a rx sleep aid.  Says he was rx'd one by prior pcp but he ended up not taking it b/c of fear of interaction with another of his meds.  He says lately he has had trouble sleeping due to left arm and "leg pain".    Past Medical History:  Diagnosis Date  . ADD (attention deficit disorder)   . Arthritis   . At risk for sleep apnea    STOP-BANG= 5      SENT TO PCP 11-05-2014  . Chronic pain   . Erectile dysfunction   . Heart murmur   . History of chicken pox   . History of concussion    june 2015--  hit head w/ syncope---  no residual  . History of syncope    june 2015 --syncope w/ fall and pt states has happened 5 more times , the last one being jan 2016,,  states had battery of test and unknown cause  . Hypertension   . Lower urinary tract symptoms (LUTS)   . Mild aortic stenosis    per echo valve area 1.78cm^2  . Myalgia and myositis, unspecified   . Prostate cancer [facility]) urologist-  dr ottelin/  oncologist-  dr Valere Dross   Stage T1c,  Gleason 4+3,  PSA 5.26, vol 49.19cc  . Restless leg   . Vertebrobasilar insufficiency    per neurologist note dr Felecia Shelling-- this is possible causing syncope-- no tx just be careful getting up from sitting position  . Wears glasses     Past Surgical History:  Procedure Laterality Date  . CERVICAL FUSION  x1  last one 2010  . EYE SURGERY Bilateral 2013   5 right eye, 3 left eye surgeries (including cataract extraction's iol w/ lens implant, other surgery's for post-op lens fungal  infection and floaters)  . GOLD SEED IMPLANT N/A 02/22/2015   Procedure: GOLD SEED IMPLANT;  Surgeon: Kathie Rhodes, MD;  Location: [facility];  Service: Urology;  Laterality: N/A;  . LEFT THIGH QUADRICEP MUSCLE BIOPSY'S  12-31-2005  . LUMBAR FUSION  x2  last one 2010  . RADIOACTIVE SEED IMPLANT N/A 05/17/2015   Procedure: RADIOACTIVE SEED IMPLANT/BRACHYTHERAPY IMPLANT;  Surgeon: Kathie Rhodes, MD;  Location: [place];  Service: Urology;  Laterality: N/A;  . TRANSRECTAL ULTRASOUND N/A 11/12/2014   Procedure: TRANSRECTAL ULTRASOUND AND BIOPSY  ;  Surgeon: Kathie Rhodes, MD;  Location: [facility];  Service: Urology;  Laterality: N/A;  . TRANSTHORACIC ECHOCARDIOGRAM  03-29-2014   mild concentric LVH/  ef A999333  grade I diastolic dysfunction/  AV poorly visualized, possibly bicupid;  mild AS,  valve area 1.78cm^2,  mean grandient 65mm Hg/  trivial TR    Family History  Problem Relation Age of Onset  . Hypertension Mother   . Alcohol abuse Father   . Arthritis Father   . Lung cancer Father     smoker  . Diabetes Brother   .  Alcohol abuse Maternal Grandfather   . Stroke Maternal Grandfather   . Cancer Paternal Grandmother   . Alcohol abuse Paternal Grandfather   . Lung cancer Brother     smoker    Social History   Social History  . Marital status: Married    Spouse name: N/A  . Number of children: N/A  . Years of education: N/A   Occupational History  . UNEMPLOYED    Social History Main Topics  . Smoking status: Never Smoker  . Smokeless tobacco: Current User    Types: Snuff  . Alcohol use 0.0 oz/week     Comment: rare  . Drug use: No  . Sexual activity: Not on file   Other Topics Concern  . Not on file   Social History Narrative  . No narrative on file    Outpatient Encounter Prescriptions as of 02/26/2016  Medication Sig  . aspirin EC 81 MG tablet Take 81 mg by mouth daily.  Marland Kitchen b complex vitamins capsule Take 1 capsule  by mouth daily.  Marland Kitchen buPROPion (WELLBUTRIN SR) 150 MG 12 hr tablet Take 150 mg by mouth 2 (two) times daily.  Marland Kitchen CIALIS 5 MG tablet Take 1 tablet by mouth daily.  . metoprolol tartrate (LOPRESSOR) 25 MG tablet TAKE 1 (ONE) TABLET BY MOUTH TWO TIMES DAILY  . Multiple Vitamin (MULTIVITAMIN) tablet Take 1 tablet by mouth daily.  Marland Kitchen rOPINIRole (REQUIP) 0.5 MG tablet Take 0.5 mg by mouth 3 (three) times daily.   . sertraline (ZOLOFT) 25 MG tablet Take 25 mg by mouth 2 (two) times daily.  . Turmeric 500 MG TABS Take 1 tablet by mouth daily.  . valsartan (DIOVAN) 160 MG tablet Take 160 mg by mouth every morning.   No facility-administered encounter medications on file as of 02/26/2016.     Allergies  Allergen Reactions  . Penicillins Other (See Comments)    Unknown childhood reaction    ROS Review of Systems  Constitutional: Negative for appetite change, chills, fatigue and fever.  HENT: Negative for congestion, dental problem, ear pain and sore throat.   Eyes: Negative for discharge, redness and visual disturbance.  Respiratory: Negative for cough, chest tightness, shortness of breath and wheezing.   Cardiovascular: Negative for chest pain, palpitations and leg swelling.  Gastrointestinal: Negative for abdominal pain, blood in stool, diarrhea, nausea and vomiting.  Genitourinary: Negative for difficulty urinating, dysuria, flank pain, frequency, hematuria and urgency.  Musculoskeletal: Negative for arthralgias, back pain, joint swelling, myalgias and neck stiffness.  Skin: Negative for pallor and rash.  Neurological: Negative for dizziness, speech difficulty, weakness and headaches.  Hematological: Negative for adenopathy. Does not bruise/bleed easily.  Psychiatric/Behavioral: Negative for confusion and sleep disturbance. The patient is not nervous/anxious.     PE; Blood pressure 108/65, pulse 61, temperature 97.7 F (36.5 C), temperature source Oral, resp. rate 16, height 6\' 3"  (1.905 m),  weight 258 lb 8 oz (117.3 kg), SpO2 94 %. Gen: Alert, well appearing.  Patient is oriented to person, place, time, and situation. AFFECT: pleasant, lucid thought and speech. ENT: Ears: EACs clear, normal epithelium.  TMs with good light reflex and landmarks bilaterally.  Eyes: no injection, icteris, swelling, or exudate.  EOMI, PERRLA. Nose: no drainage or turbinate edema/swelling.  No injection or focal lesion.  Mouth: lips without lesion/swelling.  Oral mucosa pink and moist.  Dentition intact and without obvious caries or gingival swelling.  Oropharynx without erythema, exudate, or swelling.  Neck: supple/nontender.  No LAD, mass,  or TM.  Carotid pulses 2+ bilaterally, without bruits. CV: RRR, A999333 systolic murmur best heard at left sternal border.  No r/g.   LUNGS: CTA bilat, nonlabored resps, good aeration in all lung fields. ABD: soft, NT, ND, BS normal.  No hepatospenomegaly or mass.  No bruits. EXT: no clubbing, cyanosis, or edema.  Musculoskeletal: no joint swelling, erythema, warmth, or tenderness.  ROM of all joints intact. Skin - no sores or suspicious lesions or rashes or color changes   Pertinent labs:  none  ASSESSMENT AND PLAN:   New pt; obtain prior PCP's records.  Health maintenance exam: Reviewed age and gender appropriate health maintenance issues (prudent diet, regular exercise, health risks of tobacco and excessive alcohol, use of seatbelts, fire alarms in home, use of sunscreen).  Also reviewed age and gender appropriate health screening as well as vaccine recommendations. He declined flu vaccine.  Will await old records before giving any pneumonia vaccine or Tetanus. Zostavax rx given to pt today to take to his pharmacy. Prostate ca screening: not applicable b/c he has hx of prostate cancer in remission and is followed by urologist. Colon cancer screening: pt not sure when last colonoscopy was (7-10 yrs ago).  Denies hx of polyps. Says he cannot recall who did his  last colonoscopy.  We'll await old records and then set this up as appropriate. We'll have him return for lab visit for fasting lipid panel, CMET, and Hep C screening.  He'll call with the info about what sleep aid was rx'd in the past (or I'll get this info from his old records), and I'll then rx a different one to try.  (see HPI).  An After Visit Summary was printed and given to the patient.  Return in about 6 months (around 08/26/2016) for Hypertension.  Signed:  Crissie Sickles, MD           02/26/2016

## 2016-02-26 NOTE — Progress Notes (Signed)
Pre visit review using our clinic review tool, if applicable. No additional management support is needed unless otherwise documented below in the visit note. 

## 2016-02-27 ENCOUNTER — Other Ambulatory Visit: Payer: Self-pay | Admitting: Family Medicine

## 2016-02-27 ENCOUNTER — Other Ambulatory Visit (INDEPENDENT_AMBULATORY_CARE_PROVIDER_SITE_OTHER): Payer: Managed Care, Other (non HMO)

## 2016-02-27 DIAGNOSIS — Z1159 Encounter for screening for other viral diseases: Secondary | ICD-10-CM

## 2016-02-27 DIAGNOSIS — Z0000 Encounter for general adult medical examination without abnormal findings: Secondary | ICD-10-CM | POA: Diagnosis not present

## 2016-02-27 LAB — LIPID PANEL
Cholesterol: 178 mg/dL (ref <200)
HDL: 47 mg/dL (ref >40)
LDL Cholesterol: 115 mg/dL — ABNORMAL HIGH (ref <100)
Total CHOL/HDL Ratio: 3.8 Ratio (ref <5.0)
Triglycerides: 81 mg/dL (ref <150)
VLDL: 16 mg/dL (ref <30)

## 2016-02-27 LAB — COMPREHENSIVE METABOLIC PANEL
ALT: 22 U/L (ref 9–46)
AST: 24 U/L (ref 10–35)
Albumin: 4.4 g/dL (ref 3.6–5.1)
Alkaline Phosphatase: 42 U/L (ref 40–115)
BUN: 20 mg/dL (ref 7–25)
CO2: 24 mmol/L (ref 20–31)
Calcium: 9.2 mg/dL (ref 8.6–10.3)
Chloride: 109 mmol/L (ref 98–110)
Creat: 1.24 mg/dL — ABNORMAL HIGH (ref 0.70–1.18)
Glucose, Bld: 105 mg/dL — ABNORMAL HIGH (ref 65–99)
Potassium: 4.6 mmol/L (ref 3.5–5.3)
Sodium: 143 mmol/L (ref 135–146)
Total Bilirubin: 1 mg/dL (ref 0.2–1.2)
Total Protein: 6.7 g/dL (ref 6.1–8.1)

## 2016-02-27 LAB — CBC WITH DIFFERENTIAL/PLATELET
Basophils Absolute: 0 cells/uL (ref 0–200)
Basophils Relative: 0 %
Eosinophils Absolute: 168 cells/uL (ref 15–500)
Eosinophils Relative: 4 %
HCT: 47.6 % (ref 38.5–50.0)
Hemoglobin: 15.7 g/dL (ref 13.2–17.1)
Lymphocytes Relative: 19 %
Lymphs Abs: 798 cells/uL — ABNORMAL LOW (ref 850–3900)
MCH: 31 pg (ref 27.0–33.0)
MCHC: 33 g/dL (ref 32.0–36.0)
MCV: 94.1 fL (ref 80.0–100.0)
MPV: 13 fL — ABNORMAL HIGH (ref 7.5–12.5)
Monocytes Absolute: 672 cells/uL (ref 200–950)
Monocytes Relative: 16 %
Neutro Abs: 2562 cells/uL (ref 1500–7800)
Neutrophils Relative %: 61 %
Platelets: 154 10*3/uL (ref 140–400)
RBC: 5.06 MIL/uL (ref 4.20–5.80)
RDW: 14.1 % (ref 11.0–15.0)
WBC: 4.2 10*3/uL (ref 3.8–10.8)

## 2016-02-27 LAB — TSH: TSH: 2 mIU/L (ref 0.40–4.50)

## 2016-02-28 LAB — HEMOGLOBIN A1C
Hgb A1c MFr Bld: 5 % (ref <5.7)
Mean Plasma Glucose: 97 mg/dL

## 2016-02-28 LAB — HEPATITIS C ANTIBODY: HCV Ab: NEGATIVE

## 2016-03-01 ENCOUNTER — Encounter: Payer: Self-pay | Admitting: Family Medicine

## 2016-03-02 ENCOUNTER — Other Ambulatory Visit: Payer: Self-pay

## 2016-03-02 MED ORDER — ATORVASTATIN CALCIUM 20 MG PO TABS: 20.0000 mg | ORAL_TABLET | Freq: Every day | ORAL | 2 refills | Status: DC

## 2016-03-02 MED ORDER — ROPINIROLE HCL 0.5 MG PO TABS: 0.5000 mg | ORAL_TABLET | Freq: Three times a day (TID) | ORAL | 2 refills | Status: DC

## 2016-03-16 DIAGNOSIS — R058 Other specified cough: Secondary | ICD-10-CM

## 2016-03-16 HISTORY — DX: Other specified cough: R05.8

## 2016-03-17 ENCOUNTER — Other Ambulatory Visit: Payer: Self-pay

## 2016-03-17 ENCOUNTER — Telehealth: Payer: Self-pay | Admitting: Family Medicine

## 2016-03-17 MED ORDER — ROPINIROLE HCL 0.5 MG PO TABS: 0.5000 mg | ORAL_TABLET | Freq: Three times a day (TID) | ORAL | 1 refills | Status: DC

## 2016-03-17 NOTE — Telephone Encounter (Signed)
Prescription should have been for #90 .  Resent medication with correct amount.

## 2016-03-17 NOTE — Telephone Encounter (Signed)
Patient is requesting rOPINIRole  Rx to be sent to [place]. Patient is taking 3 a day. It has gotten so bad he sometimes has to take 5 a day. He is also wondering if there is something that would work better. His restless leg syndrome has gotten really bad. Please send Rx as soon as possible.

## 2016-03-20 NOTE — Telephone Encounter (Signed)
Encourage pt to make office appt to discuss restless legs problem/treatment.

## 2016-03-23 NOTE — Telephone Encounter (Signed)
Detailed message left for patient to call back to schedule an appointment for restless leg syndrome.

## 2016-05-12 ENCOUNTER — Other Ambulatory Visit: Payer: Self-pay | Admitting: Family Medicine

## 2016-05-12 ENCOUNTER — Ambulatory Visit: Payer: Managed Care, Other (non HMO) | Admitting: Family Medicine

## 2016-05-12 MED ORDER — ROPINIROLE HCL 0.5 MG PO TABS: 0.5000 mg | ORAL_TABLET | Freq: Three times a day (TID) | ORAL | 6 refills | Status: DC

## 2016-05-12 NOTE — Telephone Encounter (Signed)
RF request for ropinirole LOV: 02/26/16 Next ov: 08/19/16 Last written: 03/17/16 #90 w/ 1RF  Please advise. Thanks.

## 2016-05-12 NOTE — Telephone Encounter (Signed)
**  Remind patient they can make refill requests via MyChart**  Medication refill request (Name & Dosage): Ropinirole 0.5 mg tablet    Preferred pharmacy (Name & Address): [facility]     Other comments (if applicable): Patient thinks prescription may need to be increased. Requests a 90 day supply. No more refills left.

## 2016-05-13 ENCOUNTER — Ambulatory Visit (INDEPENDENT_AMBULATORY_CARE_PROVIDER_SITE_OTHER): Payer: 59 | Admitting: Family Medicine

## 2016-05-13 ENCOUNTER — Encounter: Payer: Self-pay | Admitting: Family Medicine

## 2016-05-13 VITALS — BP 135/69 | HR 50 | Temp 97.6°F | Resp 16 | Ht 75.0 in | Wt 267.0 lb

## 2016-05-13 DIAGNOSIS — G2581 Restless legs syndrome: Secondary | ICD-10-CM

## 2016-05-13 DIAGNOSIS — L301 Dyshidrosis [pompholyx]: Secondary | ICD-10-CM

## 2016-05-13 MED ORDER — ROPINIROLE HCL 1 MG PO TABS: 1.0000 mg | ORAL_TABLET | Freq: Three times a day (TID) | ORAL | 3 refills | Status: DC

## 2016-05-13 MED ORDER — FLUTICASONE PROPIONATE 0.05 % EX CREA: TOPICAL_CREAM | Freq: Two times a day (BID) | CUTANEOUS | 3 refills | Status: DC

## 2016-05-13 NOTE — Progress Notes (Signed)
Pre visit review using our clinic review tool, if applicable. No additional management support is needed unless otherwise documented below in the visit note. 

## 2016-05-13 NOTE — Progress Notes (Signed)
OFFICE VISIT  05/13/2016   CC:  Chief Complaint  Patient presents with  . Rash    on hands and some times on pts knees, comes and goes, x 20 years, becoming more frequent   HPI:    Patient is a 71 y.o. Caucasian male who presents for rash. Occurs on fingers and occ on knees, very itchy little bumps that are not significantly erythematous.  When he scratches it right after it comes up, he notes moisture for brief period as if he popped very small blisters.  Also, pt says he has noted a gradual worsening of his RLS over the last 6 mo or so, wonders if he can get ropinirole dose increased.    Past Medical History:  Diagnosis Date  . ADD (attention deficit disorder)   . Arthritis   . At risk for sleep apnea    STOP-BANG= 5      SENT TO PCP 11-05-2014  . Chronic pain   . Erectile dysfunction   . Heart murmur   . History of concussion    june 2015--  hit head w/ syncope---  no residual  . History of syncope    june 2015 --syncope w/ fall and pt states has happened 5 more times , the last one being jan 2016,,  states had battery of test and unknown cause  . Hyperlipidemia 02/2016   statin recommended 02/2016  . Hypertension   . IFG (impaired fasting glucose) 02/2016   HbA1c 5 %.  . Lower urinary tract symptoms (LUTS)   . Mild aortic stenosis    per echo valve area 1.78cm^2  . Myalgia and myositis, unspecified   . Prostate cancer [facility]) urologist-  dr ottelin/  oncologist-  dr Valere Dross   Stage T1c,  Gleason 4+3,  PSA 5.26, vol 49.19cc  . Restless leg   . Vertebrobasilar insufficiency    per neurologist note dr Felecia Shelling-- this is possible causing syncope-- no tx just be careful getting up from sitting position  . Wears glasses     Past Surgical History:  Procedure Laterality Date  . CERVICAL FUSION  x1  last one 2010  . EYE SURGERY Bilateral 2013   5 right eye, 3 left eye surgeries (including cataract extraction's iol w/ lens implant, other surgery's for post-op lens fungal  infection and floaters)  . GOLD SEED IMPLANT N/A 02/22/2015   Procedure: GOLD SEED IMPLANT;  Surgeon: Kathie Rhodes, MD;  Location: [facility];  Service: Urology;  Laterality: N/A;  . LEFT THIGH QUADRICEP MUSCLE BIOPSY'S  12-31-2005  . LUMBAR FUSION  x2  last one 2010  . RADIOACTIVE SEED IMPLANT N/A 05/17/2015   Procedure: RADIOACTIVE SEED IMPLANT/BRACHYTHERAPY IMPLANT;  Surgeon: Kathie Rhodes, MD;  Location: [place];  Service: Urology;  Laterality: N/A;  . TRANSRECTAL ULTRASOUND N/A 11/12/2014   Procedure: TRANSRECTAL ULTRASOUND AND BIOPSY  ;  Surgeon: Kathie Rhodes, MD;  Location: [facility];  Service: Urology;  Laterality: N/A;  . TRANSTHORACIC ECHOCARDIOGRAM  03-29-2014   mild concentric LVH/  ef A999333  grade I diastolic dysfunction/  AV poorly visualized, possibly bicupid;  mild AS,  valve area 1.78cm^2,  mean grandient 8mm Hg/  trivial TR    Outpatient Medications Prior to Visit  Medication Sig Dispense Refill  . aspirin EC 81 MG tablet Take 81 mg by mouth daily.    Marland Kitchen atorvastatin (LIPITOR) 20 MG tablet Take 1 tablet (20 mg total) by mouth daily. 30 tablet 2  .  b complex vitamins capsule Take 1 capsule by mouth daily.    Marland Kitchen CIALIS 5 MG tablet Take 1 tablet by mouth daily.    . metoprolol tartrate (LOPRESSOR) 25 MG tablet TAKE 1 (ONE) TABLET BY MOUTH TWO TIMES DAILY  1  . Multiple Vitamin (MULTIVITAMIN) tablet Take 1 tablet by mouth daily.    . Turmeric 500 MG TABS Take 1 tablet by mouth daily.    . valsartan (DIOVAN) 160 MG tablet Take 160 mg by mouth every morning.    Marland Kitchen rOPINIRole (REQUIP) 0.5 MG tablet Take 1 tablet (0.5 mg total) by mouth 3 (three) times daily. 90 tablet 6  . buPROPion (WELLBUTRIN SR) 150 MG 12 hr tablet Take 150 mg by mouth 2 (two) times daily.    . sertraline (ZOLOFT) 25 MG tablet Take 25 mg by mouth 2 (two) times daily.  5   No facility-administered medications prior to visit.     Allergies  Allergen Reactions   . Penicillins Other (See Comments)    Unknown childhood reaction    ROS As per HPI  PE: Blood pressure 135/69, pulse (!) 50, temperature 97.6 F (36.4 C), temperature source Oral, resp. rate 16, height 6\' 3"  (1.905 m), weight 267 lb (121.1 kg), SpO2 94 %. Gen: Alert, well appearing.  Patient is oriented to person, place, time, and situation. AFFECT: pleasant, lucid thought and speech. HANDS: no rash except a small area in web space between R thumb and index finger that appears/feels a bit callused/hyperkeratotis and is a bit lighter pigmented than surrounding skin.  A similar small spot is noted on left hand index finger.  LABS:  none  IMPRESSION AND PLAN:  1) Dyshidrotic eczema: rx'd cutivate 0.05 % generic cream, apply bid prn.  2) RLS; likely has developed a little tolerance to his ropinirole. Will increase this to 1mg  tid.  An After Visit Summary was printed and given to the patient.  FOLLOW UP: Return if symptoms worsen or fail to improve.  Signed:  Crissie Sickles, MD           05/13/2016

## 2016-06-05 ENCOUNTER — Ambulatory Visit (INDEPENDENT_AMBULATORY_CARE_PROVIDER_SITE_OTHER): Payer: 59 | Admitting: Family Medicine

## 2016-06-05 ENCOUNTER — Encounter: Payer: Self-pay | Admitting: Family Medicine

## 2016-06-05 VITALS — BP 135/69 | HR 53 | Temp 97.3°F | Resp 16 | Ht 75.0 in | Wt 261.0 lb

## 2016-06-05 DIAGNOSIS — I517 Cardiomegaly: Secondary | ICD-10-CM

## 2016-06-05 DIAGNOSIS — J209 Acute bronchitis, unspecified: Secondary | ICD-10-CM | POA: Diagnosis not present

## 2016-06-05 DIAGNOSIS — R938 Abnormal findings on diagnostic imaging of other specified body structures: Secondary | ICD-10-CM | POA: Diagnosis not present

## 2016-06-05 DIAGNOSIS — I10 Essential (primary) hypertension: Secondary | ICD-10-CM | POA: Diagnosis not present

## 2016-06-05 DIAGNOSIS — R9389 Abnormal findings on diagnostic imaging of other specified body structures: Secondary | ICD-10-CM

## 2016-06-05 MED ORDER — PREDNISONE 20 MG PO TABS: ORAL_TABLET | ORAL | 0 refills | Status: DC

## 2016-06-05 MED ORDER — AZITHROMYCIN 250 MG PO TABS: ORAL_TABLET | ORAL | 0 refills | Status: DC

## 2016-06-05 MED ORDER — HYDROCODONE-HOMATROPINE 5-1.5 MG/5ML PO SYRP: ORAL_SOLUTION | ORAL | 0 refills | Status: DC

## 2016-06-05 MED ORDER — ALBUTEROL SULFATE HFA 108 (90 BASE) MCG/ACT IN AERS: 1.0000 | INHALATION_SPRAY | Freq: Four times a day (QID) | RESPIRATORY_TRACT | 0 refills | Status: DC | PRN

## 2016-06-05 NOTE — Progress Notes (Signed)
OFFICE VISIT  06/05/2016   CC:  Chief Complaint  Patient presents with  . Enlarged Heart    was seen at urgent care and chest xray showed pt had enlarged heart   HPI:    Patient is a 71 y.o. Caucasian male who presents for f/u of recent urgent care visit.  I reviewed the UC records during visit today. He was dx'd with bronchitis, was rx'd strong cough syrup and nothing else.  Says he is worse now: coughing more, esp at night.  No fever.  Some wheezing and SOB.  Cough is dry, chest feels tight. No URI sx's.  They did CXR at the visit and told pt he had an enlarged heart, so patient is here to discuss this finding further. In review of his PMH, he does have hx of HTN and mild aortic stenosis.  An echo in 2016 (done for syncope and prolonged QT) documented mild concentric LVH with normal EF, grade I DD, and mild aortic stenosis (question of bicuspid aortic valve). He saw Dr. Einar Gip, cardiologist, in the past and I reviewed his 11/2014 office note today (following his issue of syncope).  No further cardio eval was done at that time.  PA/Lat CXR in EPIC EMR 04/18/15: FINDINGS: Cardiac silhouette is normal in size and configuration. Normal mediastinal and hilar contours. Clear lungs.  No pleural effusion or pneumothorax. Stable changes from a previous low anterior cervical spine fusion. IMPRESSION: No acute cardiopulmonary disease.  I reviewed pt's CXR from his UC visit 05/29/16 and I do NOT feel like his heart is enlarged.  I would say it is the upper limit of normal size. Also, comparing this CXR to his CXR in EPIC from 04/17/16, his heart size is unchanged.  Past Medical History:  Diagnosis Date  . ADD (attention deficit disorder)   . Arthritis   . At risk for sleep apnea    STOP-BANG= 5      SENT TO PCP 11-05-2014  . Chronic pain   . Erectile dysfunction   . Heart murmur   . History of concussion    june 2015--  hit head w/ syncope---  no residual  . History of syncope    june 2015  --syncope w/ fall and pt states has happened 5 more times , the last one being jan 2016,,  states had battery of test and unknown cause  . Hyperlipidemia 02/2016   statin recommended 02/2016  . Hypertension   . IFG (impaired fasting glucose) 02/2016   HbA1c 5 %.  . Lower urinary tract symptoms (LUTS)   . Mild aortic stenosis    per echo valve area 1.78cm^2  . Myalgia and myositis, unspecified   . Prostate cancer [facility]) urologist-  dr ottelin/  oncologist-  dr Valere Dross   Stage T1c,  Gleason 4+3,  PSA 5.26, vol 49.19cc  . Restless leg   . Vertebrobasilar insufficiency    per neurologist note dr Felecia Shelling-- this is possible causing syncope-- no tx just be careful getting up from sitting position  . Wears glasses     Past Surgical History:  Procedure Laterality Date  . CERVICAL FUSION  x1  last one 2010  . EYE SURGERY Bilateral 2013   5 right eye, 3 left eye surgeries (including cataract extraction's iol w/ lens implant, other surgery's for post-op lens fungal infection and floaters)  . GOLD SEED IMPLANT N/A 02/22/2015   Procedure: GOLD SEED IMPLANT;  Surgeon: Kathie Rhodes, MD;  Location: [facility];  Service: Urology;  Laterality: N/A;  . LEFT THIGH QUADRICEP MUSCLE BIOPSY'S  12-31-2005  . LUMBAR FUSION  x2  last one 2010  . RADIOACTIVE SEED IMPLANT N/A 05/17/2015   Procedure: RADIOACTIVE SEED IMPLANT/BRACHYTHERAPY IMPLANT;  Surgeon: Kathie Rhodes, MD;  Location: [place];  Service: Urology;  Laterality: N/A;  . TRANSRECTAL ULTRASOUND N/A 11/12/2014   Procedure: TRANSRECTAL ULTRASOUND AND BIOPSY  ;  Surgeon: Kathie Rhodes, MD;  Location: [facility];  Service: Urology;  Laterality: N/A;  . TRANSTHORACIC ECHOCARDIOGRAM  03-29-2014   mild concentric LVH/  ef 85-27%/  grade I diastolic dysfunction/  AV poorly visualized, possibly bicupid;  mild AS,  valve area 1.78cm^2,  mean grandient 77mm Hg/  trivial TR    Outpatient Medications Prior to Visit   Medication Sig Dispense Refill  . aspirin EC 81 MG tablet Take 81 mg by mouth daily.    Marland Kitchen atorvastatin (LIPITOR) 20 MG tablet Take 1 tablet (20 mg total) by mouth daily. 30 tablet 2  . b complex vitamins capsule Take 1 capsule by mouth daily.    Marland Kitchen CIALIS 5 MG tablet Take 1 tablet by mouth daily.    . fluticasone (CUTIVATE) 0.05 % cream Apply topically 2 (two) times daily. 30 g 3  . metoprolol tartrate (LOPRESSOR) 25 MG tablet TAKE 1 (ONE) TABLET BY MOUTH TWO TIMES DAILY  1  . Multiple Vitamin (MULTIVITAMIN) tablet Take 1 tablet by mouth daily.    Marland Kitchen rOPINIRole (REQUIP) 1 MG tablet Take 1 tablet (1 mg total) by mouth 3 (three) times daily. 270 tablet 3  . Turmeric 500 MG TABS Take 1 tablet by mouth daily.    . valsartan (DIOVAN) 160 MG tablet Take 160 mg by mouth every morning.     No facility-administered medications prior to visit.     Allergies  Allergen Reactions  . Penicillins Other (See Comments)    Unknown childhood reaction    ROS As per HPI  PE: Blood pressure 135/69, pulse (!) 53, temperature 97.3 F (36.3 C), temperature source Oral, resp. rate 16, height 6\' 3"  (1.905 m), weight 261 lb (118.4 kg), SpO2 95 %. Gen: Alert, well appearing.  Patient is oriented to person, place, time, and situation. ENT: Ears: EACs clear, normal epithelium.  TMs with good light reflex and landmarks bilaterally.  Eyes: no injection, icteris, swelling, or exudate.  EOMI, PERRLA. Nose: no drainage or turbinate edema/swelling.  No injection or focal lesion.  Mouth: lips without lesion/swelling.  Oral mucosa pink and moist.  Dentition intact and without obvious caries or gingival swelling.  Oropharynx without erythema, exudate, or swelling.  Neck - No masses or thyromegaly or limitation in range of motion CV: RRR, no m/r/g LUNGS: CTA bilat on inspiration, with just a hint of soft insp/exp wheeze, with decent aeration.  Some coughing after some of his exhalations.  Exp phase not prolonged.  Breathing  is nonlabored. EXT: no clubbing, cyanosis, or edema.   LABS:    Chemistry      Component Value Date/Time   NA 143 02/27/2016 0851   K 4.6 02/27/2016 0851   CL 109 02/27/2016 0851   CO2 24 02/27/2016 0851   BUN 20 02/27/2016 0851   CREATININE 1.24 (H) 02/27/2016 0851      Component Value Date/Time   CALCIUM 9.2 02/27/2016 0851   ALKPHOS 42 02/27/2016 0851   AST 24 02/27/2016 0851   ALT 22 02/27/2016 0851   BILITOT 1.0 02/27/2016 0851  IMPRESSION AND PLAN:  1) Acute bronchitis with mild RAD component. He has had sx's about 17d and says he is getting worse. Will treat with prednisone 20mg , 2 tabs qd x 5d, then 1 tab qd x 5d. Z-pack eRx'd. After alb/atr neb in office today: improved aeration and cough. Hycodan syrup rx printed, 1-2 tsp bid prn, #240 ml.  2) Recent CXR that was read as cardiomegaly.  I don't agree. I feel his heart size is ULN, unchanged from CXR 04/2015 I feel this is the expected heart appearance/size for a 71 y/o man with long history of HTN.  An After Visit Summary was printed and given to the patient.  FOLLOW UP: Return in about 1 week (around 06/12/2016) for f/u bronchitis.  Signed:  Crissie Sickles, MD           06/05/2016

## 2016-06-05 NOTE — Progress Notes (Signed)
Pre visit review using our clinic review tool, if applicable. No additional management support is needed unless otherwise documented below in the visit note. 

## 2016-06-08 MED ORDER — IPRATROPIUM-ALBUTEROL 0.5-2.5 (3) MG/3ML IN SOLN
3.0000 mL | Freq: Once | RESPIRATORY_TRACT | Status: AC
  Administered 2016-06-05: 3 mL via RESPIRATORY_TRACT

## 2016-06-08 NOTE — Addendum Note (Signed)
Addended by: Gordy Councilman on: 06/08/2016 10:19 AM   Modules accepted: Orders

## 2016-06-09 ENCOUNTER — Other Ambulatory Visit: Payer: Self-pay | Admitting: Family Medicine

## 2016-06-11 ENCOUNTER — Ambulatory Visit (INDEPENDENT_AMBULATORY_CARE_PROVIDER_SITE_OTHER): Payer: 59 | Admitting: Family Medicine

## 2016-06-11 ENCOUNTER — Encounter: Payer: Self-pay | Admitting: Family Medicine

## 2016-06-11 VITALS — BP 108/72 | HR 56 | Temp 97.6°F | Resp 16 | Ht 75.0 in | Wt 260.5 lb

## 2016-06-11 DIAGNOSIS — J209 Acute bronchitis, unspecified: Secondary | ICD-10-CM

## 2016-06-11 DIAGNOSIS — H8110 Benign paroxysmal vertigo, unspecified ear: Secondary | ICD-10-CM | POA: Diagnosis not present

## 2016-06-11 MED ORDER — MECLIZINE HCL 25 MG PO TABS: 25.0000 mg | ORAL_TABLET | Freq: Three times a day (TID) | ORAL | 6 refills | Status: DC | PRN

## 2016-06-11 NOTE — Progress Notes (Signed)
OFFICE VISIT  06/14/2016   CC:  Chief Complaint  Patient presents with  . Follow-up    Bronchitis   HPI:    Patient is a 71 y.o. Caucasian male who presents for 1 week f/u acute bronchitis with some RAD component. I rx'd a z-pack and prednisone taper last visit, as well as albuterol inhaler and RF of his hycodan syrup.  Cough much improved, still taking prednisone, has used albuterol some.  No fevers, no SOB.  For about the last 3-4 days he complains of intermittent vertigo sensation.  Has fallen 6 times since last visit.  No injury, no sycope/loss of consciousness.  This occurs when going from supine or sitting to standing, also feels some vertigo while laying supine as well as with turning head to right or sometimes to left.  Mild nausea with episodes..  The symptoms last 1 min or less.  No focal weakness.  No new vision c/o.  Still also c/o constant mild feeling of disequilibrium--chronic. With head turning to either side he can feel a slight bit of sensation of the room "moving"    Past Medical History:  Diagnosis Date  . ADD (attention deficit disorder)   . Arthritis   . At risk for sleep apnea    STOP-BANG= 5      SENT TO PCP 11-05-2014  . Chronic pain   . Erectile dysfunction   . Heart murmur   . History of concussion    june 2015--  hit head w/ syncope---  no residual  . History of syncope    june 2015 --syncope w/ fall and pt states has happened 5 more times , the last one being jan 2016,,  states had battery of test and unknown cause  . Hyperlipidemia 02/2016   statin recommended 02/2016  . Hypertension   . IFG (impaired fasting glucose) 02/2016   HbA1c 5 %.  . Lower urinary tract symptoms (LUTS)   . Mild aortic stenosis    per echo valve area 1.78cm^2  . Myalgia and myositis, unspecified   . Prostate cancer [facility]) urologist-  dr ottelin/  oncologist-  dr Valere Dross   Stage T1c,  Gleason 4+3,  PSA 5.26, vol 49.19cc  . Restless leg   . Vertebrobasilar insufficiency    per neurologist note dr Felecia Shelling-- this is possible causing syncope-- no tx just be careful getting up from sitting position  . Wears glasses     Past Surgical History:  Procedure Laterality Date  . CERVICAL FUSION  x1  last one 2010  . EYE SURGERY Bilateral 2013   5 right eye, 3 left eye surgeries (including cataract extraction's iol w/ lens implant, other surgery's for post-op lens fungal infection and floaters)  . GOLD SEED IMPLANT N/A 02/22/2015   Procedure: GOLD SEED IMPLANT;  Surgeon: Kathie Rhodes, MD;  Location: [facility];  Service: Urology;  Laterality: N/A;  . LEFT THIGH QUADRICEP MUSCLE BIOPSY'S  12-31-2005  . LUMBAR FUSION  x2  last one 2010  . RADIOACTIVE SEED IMPLANT N/A 05/17/2015   Procedure: RADIOACTIVE SEED IMPLANT/BRACHYTHERAPY IMPLANT;  Surgeon: Kathie Rhodes, MD;  Location: [place];  Service: Urology;  Laterality: N/A;  . TRANSRECTAL ULTRASOUND N/A 11/12/2014   Procedure: TRANSRECTAL ULTRASOUND AND BIOPSY  ;  Surgeon: Kathie Rhodes, MD;  Location: [facility];  Service: Urology;  Laterality: N/A;  . TRANSTHORACIC ECHOCARDIOGRAM  03-29-2014   mild concentric LVH/  ef 40-10%/  grade I diastolic dysfunction/  AV  poorly visualized, possibly bicupid;  mild AS,  valve area 1.78cm^2,  mean grandient 28mm Hg/  trivial TR    Outpatient Medications Prior to Visit  Medication Sig Dispense Refill  . albuterol (VENTOLIN HFA) 108 (90 Base) MCG/ACT inhaler Inhale 1-2 puffs into the lungs every 6 (six) hours as needed for wheezing or shortness of breath. 1 Inhaler 0  . aspirin EC 81 MG tablet Take 81 mg by mouth daily.    Marland Kitchen atorvastatin (LIPITOR) 20 MG tablet TAKE 1 TABLET (20 MG TOTAL) BY MOUTH DAILY. 30 tablet 2  . b complex vitamins capsule Take 1 capsule by mouth daily.    Marland Kitchen CIALIS 5 MG tablet Take 1 tablet by mouth daily.    . fluticasone (CUTIVATE) 0.05 % cream Apply topically 2 (two) times daily. 30 g 3  . HYDROcodone-homatropine  (HYCODAN) 5-1.5 MG/5ML syrup 1-2 tsp po bid prn cough 240 mL 0  . metoprolol tartrate (LOPRESSOR) 25 MG tablet TAKE 1 (ONE) TABLET BY MOUTH TWO TIMES DAILY  1  . Multiple Vitamin (MULTIVITAMIN) tablet Take 1 tablet by mouth daily.    Marland Kitchen rOPINIRole (REQUIP) 1 MG tablet Take 1 tablet (1 mg total) by mouth 3 (three) times daily. 270 tablet 3  . Turmeric 500 MG TABS Take 1 tablet by mouth daily.    . valsartan (DIOVAN) 160 MG tablet Take 160 mg by mouth every morning.    Marland Kitchen azithromycin (ZITHROMAX) 250 MG tablet 2 tabs po qd x 1d, then 1 tab po qd x 4d (Patient not taking: Reported on 06/11/2016) 6 tablet 0  . predniSONE (DELTASONE) 20 MG tablet 2 tabs po qd x 5d, then 1 tab po qd x 5d (Patient not taking: Reported on 06/11/2016) 15 tablet 0   No facility-administered medications prior to visit.     Allergies  Allergen Reactions  . Penicillins Other (See Comments)    Unknown childhood reaction    ROS As per HPI  PE: Blood pressure 108/72, pulse (!) 56, temperature 97.6 F (36.4 C), temperature source Oral, resp. rate 16, height 6\' 3"  (1.905 m), weight 260 lb 8 oz (118.2 kg), SpO2 96 %. Gen: Alert, well appearing.  Patient is oriented to person, place, time, and situation. ENT: Ears: EACs clear, normal epithelium.  TMs with good light reflex and landmarks bilaterally.  Eyes: no injection, icteris, swelling, or exudate.  EOMI, PERRLA. Nose: no drainage or turbinate edema/swelling.  No injection or focal lesion.  Mouth: lips without lesion/swelling.  Oral mucosa pink and moist.  Dentition intact and without obvious caries or gingival swelling.  Oropharynx without erythema, exudate, or swelling.  Neck - No masses or thyromegaly or limitation in range of motion CV: RRR, no m/r/g.   LUNGS: CTA bilat except mild end exp coarse wheeze followed by several coughs, nonlabored resps, good aeration in all lung fields. EXT: no clubbing, cyanosis, or edema.  Neuro: CN 2-12 intact bilaterally, strength 5/5 in  proximal and distal upper extremities and lower extremities bilaterally.  No sensory deficits.  No tremor.  FNF normal bilat.  No ataxia.  Upper extremity and lower extremity DTRs symmetric.  No pronator drift. Dix-Halpike elicits vertigo and rotary nystagmus with head turned to R.  It extinguishes after 15 seconds. With head turned to left, vertigo occurs with a trace of rotary nystagmus when he goes from supine to upright position. Extinguishes in 10 seconds.   LABS:  none  IMPRESSION AND PLAN:  1) Acute bronchitis with RAD component: resolving  appriately. Finish steroid taper, use albuterol HFA q6h prn.  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 Discussed/explained dx. Home Epley maneuvers explained, handout given. Meclizine rx'd for disequilibrium sx's.  I explained to him that this med would NOT cure/alter his vertigo.  An After Visit Summary was printed and given to the patient.  FOLLOW UP: Return if symptoms worsen or fail to improve.  Signed:  Crissie Sickles, MD           06/14/2016

## 2016-06-11 NOTE — Progress Notes (Signed)
Pre visit review using our clinic review tool, if applicable. No additional management support is needed unless otherwise documented below in the visit note. 

## 2016-06-22 ENCOUNTER — Encounter: Payer: Self-pay | Admitting: Family Medicine

## 2016-06-22 ENCOUNTER — Ambulatory Visit (INDEPENDENT_AMBULATORY_CARE_PROVIDER_SITE_OTHER): Payer: 59 | Admitting: Family Medicine

## 2016-06-22 VITALS — BP 134/80 | HR 60 | Temp 97.9°F | Resp 20 | Wt 259.5 lb

## 2016-06-22 DIAGNOSIS — R05 Cough: Secondary | ICD-10-CM | POA: Diagnosis not present

## 2016-06-22 DIAGNOSIS — R059 Cough, unspecified: Secondary | ICD-10-CM

## 2016-06-22 DIAGNOSIS — J40 Bronchitis, not specified as acute or chronic: Secondary | ICD-10-CM | POA: Diagnosis not present

## 2016-06-22 MED ORDER — HYDROCOD POLST-CPM POLST ER 10-8 MG/5ML PO SUER: 5.0000 mL | Freq: Every evening | ORAL | 0 refills | Status: DC | PRN

## 2016-06-22 MED ORDER — DOXYCYCLINE HYCLATE 100 MG PO TABS: 100.0000 mg | ORAL_TABLET | Freq: Two times a day (BID) | ORAL | 0 refills | Status: DC

## 2016-06-22 MED ORDER — BENZONATATE 200 MG PO CAPS: 200.0000 mg | ORAL_CAPSULE | Freq: Two times a day (BID) | ORAL | 0 refills | Status: DC | PRN

## 2016-06-22 MED ORDER — LEVOCETIRIZINE DIHYDROCHLORIDE 5 MG PO TABS: 5.0000 mg | ORAL_TABLET | Freq: Every evening | ORAL | 1 refills | Status: DC

## 2016-06-22 NOTE — Progress Notes (Signed)
Andrew Stevenson , 1945-05-04, 71 y.o., male MRN: [id number] Patient Care Team    Relationship Specialty Notifications Start End  Tammi Sou, MD PCP - General Family Medicine  02/26/16   Kathie Rhodes, MD Consulting Physician Urology  02/26/16   Adrian Prows, MD Consulting Physician Cardiology  02/26/16   Tyler Pita, MD Consulting Physician Radiation Oncology  02/26/16     CC: cough, nasal congestion Subjective: Pt presents for an OV with complaints of cough of ~ 3 months duration.  Associated symptoms include upper respiratory symptoms of nasal congestion, rhinorrhea, nausea, mild vomit with cough. He reports his congestion is worse at night and when laying flat. If feels phlegm drips down the back of his throat. He had been seen in the UC and with his PCP twice, last visit 3/29 he was reporting improved symptoms after steroid/abx and albuterol treatment. He states he never reached full improvement, and over the last week symptoms restarted. He states he use to have bad sinus infections for years, but nothing within the last few years. He denies wheezing or shortness of breath. He denies history of GERD or Seasonal allergies in the past. He is prescribed losartan for HTN. He does use dip/chewing tobacco.  He reports he is unable to sleep, but the cough syrup prescribed last visit (hycodan) made him itch. The cough syrup prior from UC tussionex was helpful and did not make him itch.   Depression screen [facility] 2/9 06/13/2015 03/22/2015 12/03/2014  Decreased Interest 0 0 0  Down, Depressed, Hopeless 0 0 0  PHQ - 2 Score 0 0 0    Allergies  Allergen Reactions  . Penicillins Other (See Comments)    Unknown childhood reaction   Social History  Substance Use Topics  . Smoking status: Never Smoker  . Smokeless tobacco: Current User    Types: Snuff  . Alcohol use 0.0 oz/week     Comment: rare   Past Medical History:  Diagnosis Date  . ADD (attention deficit disorder)   . Arthritis   .  At risk for sleep apnea    STOP-BANG= 5      SENT TO PCP 11-05-2014  . Chronic pain   . Erectile dysfunction   . Heart murmur   . History of concussion    june 2015--  hit head w/ syncope---  no residual  . History of syncope    june 2015 --syncope w/ fall and pt states has happened 5 more times , the last one being jan 2016,,  states had battery of test and unknown cause  . Hyperlipidemia 02/2016   statin recommended 02/2016  . Hypertension   . IFG (impaired fasting glucose) 02/2016   HbA1c 5 %.  . Lower urinary tract symptoms (LUTS)   . Mild aortic stenosis    per echo valve area 1.78cm^2  . Myalgia and myositis, unspecified   . Prostate cancer [facility]) urologist-  dr ottelin/  oncologist-  dr Valere Dross   Stage T1c,  Gleason 4+3,  PSA 5.26, vol 49.19cc  . Restless leg   . Vertebrobasilar insufficiency    per neurologist note dr Felecia Shelling-- this is possible causing syncope-- no tx just be careful getting up from sitting position  . Wears glasses    Past Surgical History:  Procedure Laterality Date  . CERVICAL FUSION  x1  last one 2010  . EYE SURGERY Bilateral 2013   5 right eye, 3 left eye surgeries (including cataract extraction's iol w/ lens  implant, other surgery's for post-op lens fungal infection and floaters)  . GOLD SEED IMPLANT N/A 02/22/2015   Procedure: GOLD SEED IMPLANT;  Surgeon: Kathie Rhodes, MD;  Location: [facility];  Service: Urology;  Laterality: N/A;  . LEFT THIGH QUADRICEP MUSCLE BIOPSY'S  12-31-2005  . LUMBAR FUSION  x2  last one 2010  . RADIOACTIVE SEED IMPLANT N/A 05/17/2015   Procedure: RADIOACTIVE SEED IMPLANT/BRACHYTHERAPY IMPLANT;  Surgeon: Kathie Rhodes, MD;  Location: [place];  Service: Urology;  Laterality: N/A;  . TRANSRECTAL ULTRASOUND N/A 11/12/2014   Procedure: TRANSRECTAL ULTRASOUND AND BIOPSY  ;  Surgeon: Kathie Rhodes, MD;  Location: [facility];  Service: Urology;  Laterality: N/A;  . TRANSTHORACIC  ECHOCARDIOGRAM  03-29-2014   mild concentric LVH/  ef 64-40%/  grade I diastolic dysfunction/  AV poorly visualized, possibly bicupid;  mild AS,  valve area 1.78cm^2,  mean grandient 57mm Hg/  trivial TR   Family History  Problem Relation Age of Onset  . Hypertension Mother   . Alcohol abuse Father   . Arthritis Father   . Lung cancer Father     smoker  . Diabetes Brother   . Alcohol abuse Maternal Grandfather   . Stroke Maternal Grandfather   . Cancer Paternal Grandmother   . Alcohol abuse Paternal Grandfather   . Lung cancer Brother     smoker   Allergies as of 06/22/2016      Reactions   Penicillins Other (See Comments)   Unknown childhood reaction      Medication List       Accurate as of 06/22/16  3:26 PM. Always use your most recent med list.          albuterol 108 (90 Base) MCG/ACT inhaler Commonly known as:  VENTOLIN HFA Inhale 1-2 puffs into the lungs every 6 (six) hours as needed for wheezing or shortness of breath.   aspirin EC 81 MG tablet Take 81 mg by mouth daily.   atorvastatin 20 MG tablet Commonly known as:  LIPITOR TAKE 1 TABLET (20 MG TOTAL) BY MOUTH DAILY.   b complex vitamins capsule Take 1 capsule by mouth daily.   CIALIS 5 MG tablet Generic drug:  tadalafil Take 1 tablet by mouth daily.   fluticasone 0.05 % cream Commonly known as:  CUTIVATE Apply topically 2 (two) times daily.   meclizine 25 MG tablet Commonly known as:  ANTIVERT Take 1 tablet (25 mg total) by mouth 3 (three) times daily as needed for dizziness.   metoprolol tartrate 25 MG tablet Commonly known as:  LOPRESSOR TAKE 1 (ONE) TABLET BY MOUTH TWO TIMES DAILY   multivitamin tablet Take 1 tablet by mouth daily.   rOPINIRole 1 MG tablet Commonly known as:  REQUIP Take 1 tablet (1 mg total) by mouth 3 (three) times daily.   Turmeric 500 MG Tabs Take 1 tablet by mouth daily.   valsartan 160 MG tablet Commonly known as:  DIOVAN Take 160 mg by mouth every morning.        No results found for this or any previous visit (from the past 24 hour(s)). No results found.   ROS: Negative, with the exception of above mentioned in HPI   Objective:  BP 134/80 (BP Location: Right Arm, Patient Position: Sitting, Cuff Size: Large)   Pulse 60   Temp 97.9 F (36.6 C)   Resp 20   Wt 259 lb 8 oz (117.7 kg)   SpO2 96%  BMI 32.44 kg/m  Body mass index is 32.44 kg/m. Gen: Afebrile. No acute distress. Nontoxic in appearance, well developed, well nourished.  HENT: AT. [place]. Bilateral TM visualized bile erythema or bulging. MMM, no oral lesions. Bilateral nares with erythema and drainage. Throat without erythema or exudates. Cough present. Hoarseness present. Sinus tenderness. Eyes:Pupils Equal Round Reactive to light, Extraocular movements intact,  Conjunctiva without redness, discharge or icterus. Neck/lymp/endocrine: Supple, no lymphadenopathy CV: RRR Chest: CTAB, no wheeze or crackles. Good air movement, normal resp effort.  Abd: Soft. NTND. BS present.   Neuro:  Normal gait. PERLA. EOMi. Alert. Oriented x3   Assessment/Plan: Andrew Stevenson is a 71 y.o. male present for OV for  Cough Bronchitis - Given mild improvement in symptoms after steroid, albuterol and antibiotic a few weeks ago could potentially be more sinus/allergy related symptoms. Treat as such today, but would be concerned if not improved and would likely refer to ENT given history of tobacco use (Dip/skoal). - Is also on losartan, however likely low yield given other symptoms. Considered reflux as potential cause, however with other sinus like symptoms also use low yield. - Patient is to start Mucinex DM and maintain hydration. - doxycycline (VIBRA-TABS) 100 MG tablet; Take 1 tablet (100 mg total) by mouth 2 (two) times daily.  Dispense: 20 tablet; Refill: 0 - benzonatate (TESSALON) 200 MG capsule; Take 1 capsule (200 mg total) by mouth 2 (two) times daily as needed for cough.  Dispense: 20 capsule;  Refill: 0 - levocetirizine (XYZAL) 5 MG tablet; Take 1 tablet (5 mg total) by mouth every evening.  Dispense: 30 tablet; Refill: 1 - Tussionex cough syrup prescribed for patient today, discussed with him in great detail this is a controlled substance, it does contain hydrocodone and addiction is possible. He is to use this only at nighttime for cough. No Refills will be given. If refills asked, would be concerned since he has had Tussionex (urgent care), then Hycodan by PCP (which he states caused him to itch) and then Tussionex here today  Over the last few months. Patient is aware.   Reviewed expectations re: course of current medical issues.  Discussed self-management of symptoms.  Outlined signs and symptoms indicating need for more acute intervention.  Patient verbalized understanding and all questions were answered.  Patient received an After-Visit Summary.   electronically signed by:  Howard Pouch, DO  [place]

## 2016-06-22 NOTE — Patient Instructions (Addendum)
xyzal--> this allergy medicine for you to start once a day.  Doxycyline antibiotic every 12 hours for 10 days.  Mucinex DM over the counter for cough and phlegm.  Perles which is also for cough.   If this does not improve, then I would recommend referral to ENT (especially with dipping).

## 2016-06-24 ENCOUNTER — Encounter: Payer: Self-pay | Admitting: Family Medicine

## 2016-06-24 NOTE — Progress Notes (Signed)
Noted. Agree.

## 2016-06-30 ENCOUNTER — Encounter: Payer: Self-pay | Admitting: Family Medicine

## 2016-06-30 ENCOUNTER — Ambulatory Visit (INDEPENDENT_AMBULATORY_CARE_PROVIDER_SITE_OTHER): Payer: 59 | Admitting: Family Medicine

## 2016-06-30 VITALS — BP 105/63 | HR 56 | Temp 97.7°F | Resp 16 | Ht 75.0 in | Wt 260.5 lb

## 2016-06-30 DIAGNOSIS — E878 Other disorders of electrolyte and fluid balance, not elsewhere classified: Secondary | ICD-10-CM | POA: Diagnosis not present

## 2016-06-30 DIAGNOSIS — Z79899 Other long term (current) drug therapy: Secondary | ICD-10-CM | POA: Diagnosis not present

## 2016-06-30 DIAGNOSIS — R05 Cough: Secondary | ICD-10-CM

## 2016-06-30 DIAGNOSIS — R053 Chronic cough: Secondary | ICD-10-CM

## 2016-06-30 MED ORDER — OMEPRAZOLE 40 MG PO CPDR: 40.0000 mg | DELAYED_RELEASE_CAPSULE | Freq: Every day | ORAL | 3 refills | Status: DC

## 2016-06-30 MED ORDER — PREDNISONE 20 MG PO TABS: ORAL_TABLET | ORAL | 0 refills | Status: DC

## 2016-06-30 MED ORDER — CETIRIZINE HCL 10 MG PO TABS: 10.0000 mg | ORAL_TABLET | Freq: Every day | ORAL | 3 refills | Status: DC

## 2016-06-30 NOTE — Progress Notes (Signed)
Pre visit review using our clinic review tool, if applicable. No additional management support is needed unless otherwise documented below in the visit note. 

## 2016-06-30 NOTE — Patient Instructions (Signed)
Elevate the head of your bed (ideally with 6 inch  bed blocks),  Smoking cessation, avoidance of late meals, excessive alcohol, and avoid fatty foods, chocolate, peppermint, colas, red wine, and acidic juices such as orange juice.  NO MINT OR MENTHOL PRODUCTS SO NO COUGH DROPS   USE SUGARLESS CANDY INSTEAD (Jolley ranchers or Stover's or Life Savers) or even ice chips will also do - the key is to swallow to prevent all throat clearing. NO OIL BASED VITAMINS - use powdered substitutes.  You may ween off your bupropion (wellbutrin): take 1 tab every other day for 10 days, then stop.  Take otc generic delsym for cough suppression AS NEEDED.

## 2016-06-30 NOTE — Progress Notes (Signed)
OFFICE VISIT  06/30/2016   CC:  Chief Complaint  Patient presents with  . Cough   HPI:    Patient is a 71 y.o. Caucasian male who presents for f/u cough. Was last seen 06/22/16 by Dr. Raoul Pitch, was dx'd with possible allergy induced sinus sx's and PND cough, was rx'd xyzal, doxycycline, tessalon perls, and tussionex. Apparently pt gave a history to Dr. Raoul Pitch of a more chronic --3 mo or so--cough, with periods of acute worsening which I have treated as acute bronchitis with prednisone and antibiotics (in review of my notes, there is no mention of cough complaint until he went to UC in mid march this year, and he had had his cough for about 2 weeks at that time--which would make the cough duration about 6 weeks long at this time). He improved with this treatment, but states he never completely reached the point of having completely resolved symptoms.  He does dip, so there is possible concern for an upper respiratory issue related to long term dip use and ENT referral has been brought up if he does not show improvement with latest therapies.  A CXR (PA/Lat) done 06/16/16 at an urgent care visit showed normal lungs, mediastinum, heart, and bones.  Says nothing has changed.  Head clogs up, coughs up phlegm, says cough is constant but not as bad during the day. Not stopped up in head during day.  No SOB.  Reports wheezing from chest intermittently but no chest tightness and no CP. Tessalon perls no help.  Says hydrocodone helps but he admits it makes him feel good but this scares him and he wants to take no further of this type of med. Says he has been feeling some epigastric pain with eating lately, feeling of need to belch.  Doesn't really feel substernal burning. Has been on metoprolol for approx a year---cough started long after starting this.  Dizziness: he has had no further episodes of vertigo since I saw him last. He still has his baseline chronic dizziness/disequilibrium.  No falls or  syncope.   Past Medical History:  Diagnosis Date  . Adult ADHD    adderall caused elevated bp  . Anxiety and depression   . Arthritis    C-spine  . At risk for sleep apnea    STOP-BANG= 5      SENT TO PCP 11-05-2014  . Chronic pain   . Erectile dysfunction   . Heart murmur   . History of concussion    june 2015--  hit head w/ syncope---  no residual  . History of memory loss    per prior PCP records.  . History of syncope    june 2015 --syncope w/ fall and pt states has happened 5 more times , the last one being jan 2016,,  states had battery of test and unknown cause  . Hyperlipidemia 02/2016   statin recommended 02/2016  . Hypertension   . IFG (impaired fasting glucose) 02/2016   HbA1c 5 %.  . Insomnia   . Lower urinary tract symptoms (LUTS)   . Mild aortic stenosis    per echo valve area 1.78cm^2  . Myalgia and myositis, unspecified   . Prostate cancer [facility]) urologist-  dr ottelin/  oncologist-  dr Valere Dross   2016 Stage T1c,  Gleason 4+3,  PSA 5.26, vol 49.19cc  . Restless leg   . Vertebrobasilar insufficiency    per neurologist note dr Felecia Shelling-- this is possible causing syncope-- no tx just be careful  getting up from sitting position  . Wears glasses     Past Surgical History:  Procedure Laterality Date  . CERVICAL FUSION  x1  last one 2010  . COLONOSCOPY  02/2004  . EYE SURGERY Bilateral 2013   5 right eye, 3 left eye surgeries (including cataract extraction's iol w/ lens implant, other surgery's for post-op lens fungal infection and floaters)  . GOLD SEED IMPLANT N/A 02/22/2015   Procedure: GOLD SEED IMPLANT;  Surgeon: Kathie Rhodes, MD;  Location: [facility];  Service: Urology;  Laterality: N/A;  . LEFT THIGH QUADRICEP MUSCLE BIOPSY'S  12-31-2005  . LUMBAR FUSION  x2  last one 2010  . RADIOACTIVE SEED IMPLANT N/A 05/17/2015   Procedure: RADIOACTIVE SEED IMPLANT/BRACHYTHERAPY IMPLANT;  Surgeon: Kathie Rhodes, MD;  Location: [place];   Service: Urology;  Laterality: N/A;  . TRANSRECTAL ULTRASOUND N/A 11/12/2014   Procedure: TRANSRECTAL ULTRASOUND AND BIOPSY  ;  Surgeon: Kathie Rhodes, MD;  Location: [facility];  Service: Urology;  Laterality: N/A;  . TRANSTHORACIC ECHOCARDIOGRAM  03-29-2014   mild concentric LVH/  ef 37-16%/  grade I diastolic dysfunction/  AV poorly visualized, possibly bicupid;  mild AS,  valve area 1.78cm^2,  mean grandient 51mm Hg/  trivial TR    Outpatient Medications Prior to Visit  Medication Sig Dispense Refill  . albuterol (VENTOLIN HFA) 108 (90 Base) MCG/ACT inhaler Inhale 1-2 puffs into the lungs every 6 (six) hours as needed for wheezing or shortness of breath. 1 Inhaler 0  . aspirin EC 81 MG tablet Take 81 mg by mouth daily.    Marland Kitchen atorvastatin (LIPITOR) 20 MG tablet TAKE 1 TABLET (20 MG TOTAL) BY MOUTH DAILY. 30 tablet 2  . b complex vitamins capsule Take 1 capsule by mouth daily.    . benzonatate (TESSALON) 200 MG capsule Take 1 capsule (200 mg total) by mouth 2 (two) times daily as needed for cough. 20 capsule 0  . CIALIS 5 MG tablet Take 1 tablet by mouth daily.    . fluticasone (CUTIVATE) 0.05 % cream Apply topically 2 (two) times daily. 30 g 3  . levocetirizine (XYZAL) 5 MG tablet Take 1 tablet (5 mg total) by mouth every evening. 30 tablet 1  . meclizine (ANTIVERT) 25 MG tablet Take 1 tablet (25 mg total) by mouth 3 (three) times daily as needed for dizziness. 90 tablet 6  . metoprolol tartrate (LOPRESSOR) 25 MG tablet TAKE 1 (ONE) TABLET BY MOUTH TWO TIMES DAILY  1  . Multiple Vitamin (MULTIVITAMIN) tablet Take 1 tablet by mouth daily.    Marland Kitchen rOPINIRole (REQUIP) 1 MG tablet Take 1 tablet (1 mg total) by mouth 3 (three) times daily. 270 tablet 3  . Turmeric 500 MG TABS Take 1 tablet by mouth daily.    . valsartan (DIOVAN) 160 MG tablet Take 160 mg by mouth every morning.    . chlorpheniramine-HYDROcodone (TUSSIONEX PENNKINETIC ER) 10-8 MG/5ML SUER Take 5 mLs by mouth at bedtime  as needed for cough. (Patient not taking: Reported on 06/30/2016) 140 mL 0  . doxycycline (VIBRA-TABS) 100 MG tablet Take 1 tablet (100 mg total) by mouth 2 (two) times daily. (Patient not taking: Reported on 06/30/2016) 20 tablet 0   No facility-administered medications prior to visit.     Allergies  Allergen Reactions  . Penicillins Other (See Comments)    Unknown childhood reaction    ROS As per HPI  PE: Blood pressure 105/63, pulse (!) 56, temperature  97.7 F (36.5 C), temperature source Oral, resp. rate 16, height 6\' 3"  (1.905 m), weight 260 lb 8 oz (118.2 kg), SpO2 94 %. Gen: Alert, well appearing.  Patient is oriented to person, place, time, and situation. AFFECT: pleasant, lucid thought and speech. ENT: Ears: EACs clear, normal epithelium.  TMs with good light reflex and landmarks bilaterally.  Eyes: no injection, icteris, swelling, or exudate.  EOMI, PERRLA. Nose: no drainage or turbinate edema/swelling.  No injection or focal lesion.  Mouth: lips without lesion/swelling.  Oral mucosa pink and moist.  Dentition intact and without obvious caries or gingival swelling.  Oropharynx without erythema, exudate, or swelling.  Neck - No masses or thyromegaly or limitation in range of motion CV: RRR, no m/r/g.   LUNGS: CTA bilat, nonlabored resps, good aeration in all lung fields.  He does have some brief dry coughing after exhalation when taking deep breaths. EXT: no clubbing, cyanosis, or edema.   LABS:  None today.  IMPRESSION AND PLAN:  1) Subacute/chronic cough: He improves significantly with prednisone use but fails to reach the point of having no symptoms.  Then symptoms seem to return to previous level in a few days.   Have suspicion he may have upper airway cough syndrome, but getting better with prednisone would not fit with this diagnosis. Potential adult onset asthma, cough variant.  Has some report of wheezing but this could conceivably be laryngospasm/pseudowheeze. Plan  is to give another steroid taper: pred 40mg  qd x 5d, then 20mg  qd x 5d, then 10mg  qd x 6d. Start zyrtec 10mg  qd and omeprazole 40mg  qAM. Stop hydrocodone cough syrup.  Use otc generic delsym for prn cough suppressant. Will refer to pulmonology for further evaluation.  Further instructions: Elevate the head of your bed (ideally with 6 inch  bed blocks),  Smoking cessation, avoidance of late meals, excessive alcohol, and avoid fatty foods, chocolate, peppermint, colas, red wine, and acidic juices such as orange juice.  NO MINT OR MENTHOL PRODUCTS SO NO COUGH DROPS   USE SUGARLESS CANDY INSTEAD (Jolley ranchers or Stover's or Life Savers) or even ice chips will also do - the key is to swallow to prevent all throat clearing. NO OIL BASED VITAMINS - use powdered substitutes.  2) Polypharmacy: pt wanted to see if there were any meds he could discontinue today. He states he was put on wellbutrin as an alternative to stimulants for adult ADHD. He says he doesn't feel like this has helped at all. Instructions: You may ween off your bupropion (wellbutrin): take 1 tab every other day for 10 days, then stop.  3) chronic dizziness, consider side effect from ropinirole as potential contributor to this problem. Pt is resistant to change of this med b/c his RLS is so bothersome w/out it. We discussed potential trial of clonazepam but he wants to avoid any meds that have addiction potential. "I'd rather be dizzy than have restless legs".  An After Visit Summary was printed and given to the patient.  FOLLOW UP: Return in about 4 weeks (around 07/28/2016) for f/u cough.  Signed:  Crissie Sickles, MD           06/30/2016

## 2016-07-08 ENCOUNTER — Encounter: Payer: Self-pay | Admitting: Pulmonary Disease

## 2016-07-08 ENCOUNTER — Ambulatory Visit (INDEPENDENT_AMBULATORY_CARE_PROVIDER_SITE_OTHER): Payer: 59 | Admitting: Pulmonary Disease

## 2016-07-08 VITALS — BP 152/92 | HR 58 | Ht 75.0 in | Wt 271.2 lb

## 2016-07-08 DIAGNOSIS — R05 Cough: Secondary | ICD-10-CM

## 2016-07-08 DIAGNOSIS — R058 Other specified cough: Secondary | ICD-10-CM

## 2016-07-08 MED ORDER — FLUTICASONE PROPIONATE 50 MCG/ACT NA SUSP: 2.0000 | Freq: Every day | NASAL | 2 refills | Status: DC

## 2016-07-08 MED ORDER — CHLORPHENIRAMINE MALEATE 4 MG PO TABS: 4.0000 mg | ORAL_TABLET | Freq: Every day | ORAL | 0 refills | Status: DC

## 2016-07-08 NOTE — Progress Notes (Signed)
Past surgical history He  has a past surgical history that includes LEFT THIGH QUADRICEP MUSCLE BIOPSY'S (12-31-2005); Eye surgery (Bilateral, 2013); Cervical fusion (x1  last one 2010); Lumbar fusion (x2  last one 2010); transthoracic echocardiogram (03-29-2014); Transrectal ultrasound (N/A, 11/12/2014); Gold seed implant (N/A, 02/22/2015); Radioactive seed implant (N/A, 05/17/2015); and Colonoscopy (02/2004).  Family history His family history includes Alcohol abuse in his father, maternal grandfather, and paternal grandfather; Arthritis in his father; Cancer in his paternal grandmother; Diabetes in his brother; Hypertension in his mother; Lung cancer in his brother and father; Stroke in his maternal grandfather.  Social history He  reports that he has never smoked. His smokeless tobacco use includes Snuff. He reports that he drinks alcohol. He reports that he does not use drugs.  Allergies  Allergen Reactions  . Penicillins Other (See Comments)    Unknown childhood reaction    Review of Systems  Constitutional: Negative for fever and unexpected weight change.  HENT: Positive for congestion, postnasal drip and rhinorrhea. Negative for dental problem, ear pain, nosebleeds, sinus pressure, sneezing, sore throat and trouble swallowing.        Mostly at night  Eyes: Negative for redness and itching.  Respiratory: Positive for cough, shortness of breath and wheezing. Negative for chest tightness.        Cough spasms >> worse at night  Cardiovascular: Negative for palpitations and leg swelling.  Gastrointestinal: Negative for nausea and vomiting.  Genitourinary: Negative for dysuria.  Musculoskeletal: Negative for joint swelling.  Skin: Negative for rash.  Neurological: Positive for dizziness. Negative for headaches.  Hematological: Does not bruise/bleed easily.  Psychiatric/Behavioral: Negative for dysphoric mood. The patient is not nervous/anxious.     Current Outpatient Prescriptions on  File Prior to Visit  Medication Sig  . albuterol (VENTOLIN HFA) 108 (90 Base) MCG/ACT inhaler Inhale 1-2 puffs into the lungs every 6 (six) hours as needed for wheezing or shortness of breath.  Marland Kitchen aspirin EC 81 MG tablet Take 81 mg by mouth daily.  Marland Kitchen atorvastatin (LIPITOR) 20 MG tablet TAKE 1 TABLET (20 MG TOTAL) BY MOUTH DAILY.  Marland Kitchen b complex vitamins capsule Take 1 capsule by mouth daily.  . benzonatate (TESSALON) 200 MG capsule Take 1 capsule (200 mg total) by mouth 2 (two) times daily as needed for cough.  . CIALIS 5 MG tablet Take 1 tablet by mouth daily.  . fluticasone (CUTIVATE) 0.05 % cream Apply topically 2 (two) times daily.  . meclizine (ANTIVERT) 25 MG tablet Take 1 tablet (25 mg total) by mouth 3 (three) times daily as needed for dizziness.  . metoprolol tartrate (LOPRESSOR) 25 MG tablet TAKE 1 (ONE) TABLET BY MOUTH TWO TIMES DAILY  . Multiple Vitamin (MULTIVITAMIN) tablet Take 1 tablet by mouth daily.  Marland Kitchen omeprazole (PRILOSEC) 40 MG capsule Take 1 capsule (40 mg total) by mouth daily.  Marland Kitchen rOPINIRole (REQUIP) 1 MG tablet Take 1 tablet (1 mg total) by mouth 3 (three) times daily.  . Turmeric 500 MG TABS Take 1 tablet by mouth daily.  . valsartan (DIOVAN) 160 MG tablet Take 160 mg by mouth every morning.   No current facility-administered medications on file prior to visit.     Chief Complaint  Patient presents with  . Pulmonary Consult    Referred by Dr Anitra Lauth for chronic cough, present x 3 months. CXR 05/29/16 (outside films)    Past medical history He  has a past medical history of Adult ADHD; Anxiety and depression; Arthritis; At risk  for sleep apnea; Chronic pain; Erectile dysfunction; Heart murmur; History of concussion; History of memory loss; History of syncope; Hyperlipidemia (02/2016); Hypertension; IFG (impaired fasting glucose) (02/2016); Insomnia; Lower urinary tract symptoms (LUTS); Mild aortic stenosis; Myalgia and myositis, unspecified; Prostate cancer [facility])  (urologist-  dr ottelin/  oncologist-  dr Valere Dross); Restless leg; Vertebrobasilar insufficiency; and Wears glasses.  Vital signs BP (!) 152/92 (BP Location: Left Arm, Cuff Size: Normal)   Pulse (!) 58   Ht 6\' 3"  (1.905 m)   Wt 271 lb 3.2 oz (123 kg)   SpO2 92%   BMI 33.90 kg/m   History of present illness Andrew Stevenson is a 71 y.o. male with persistent cough.  His symptoms started in March.  He was having cough, sinus congestion, and wheeze.  He has been treated with several courses of prednisone and antibiotics.  He has improvement on prednisone, but then symptoms recur.  He has been tried on inhaler therapy and oral antihistamines.  He has also been tried on opiate based antitussive.  During this time period he developed vertigo.  He continues to have sinus congestion, runny nose and post nasal drip.  He also has cough and chest congestion with some wheezing.  He had CXR 05/29/16 with report mentioning cardiomegaly, but no other findings.    He is from [place].  Denies smoking.  No history of asthma, allergies.  He does get chronic sinus congestion and drainage especially at night.  He gets rash on his hands when he shoots his guns, but not otherwise.  Denies abdominal pain, reflux, nausea, or diarrhea.  No prior history of pneumonia or exposure to tuberculosis.  He is retired.  No recent sick exposures or travel history.  He had b/l eye lens surgery complicated by fungal infections.  He was left with visual defects and trouble focusing.  Physical exam  General - No distress ENT - No sinus tenderness, no oral exudate, no LAN, no thyromegaly, TM clear, pupils equal/reactive Cardiac - s1s2 regular, no murmur, pulses symmetric Chest - No wheeze/rales/dullness, good air entry, normal respiratory excursion Back - No focal tenderness Abd - Soft, non-tender, no organomegaly, + bowel sounds Ext - No edema Neuro - Normal strength, cranial nerves intact Skin - No rashes Psych - Normal  mood, and behavior   CMP Latest Ref Rng & Units 02/27/2016 05/10/2015 02/22/2015  Glucose 65 - 99 mg/dL 105(H) 102(H) 99  BUN 7 - 25 mg/dL 20 20 -  Creatinine 0.70 - 1.18 mg/dL 1.24(H) 1.03 -  Sodium 135 - 146 mmol/L 143 141 140  Potassium 3.5 - 5.3 mmol/L 4.6 4.4 4.1  Chloride 98 - 110 mmol/L 109 111 -  CO2 20 - 31 mmol/L 24 22 -  Calcium 8.6 - 10.3 mg/dL 9.2 8.7(L) -  Total Protein 6.1 - 8.1 g/dL 6.7 6.5 -  Total Bilirubin 0.2 - 1.2 mg/dL 1.0 1.4(H) -  Alkaline Phos 40 - 115 U/L 42 52 -  AST 10 - 35 U/L 24 29 -  ALT 9 - 46 U/L 22 34 -     CBC Latest Ref Rng & Units 02/27/2016 05/10/2015 02/22/2015  WBC 3.8 - 10.8 K/uL 4.2 4.4 -  Hemoglobin 13.2 - 17.1 g/dL 15.7 15.0 17.3(H)  Hematocrit 38.5 - 50.0 % 47.6 45.1 51.0  Platelets 140 - 400 K/uL 154 134(L) -     Discussion His symptom description is consistent with upper airway cough syndrome.  Assessment/plan  Upper airway cough syndrome. -Nasal irrigation (  saline nasal spray) daily -Flonase 1 spray daily -Chlorpheniramine 4 mg nightly -Salt water gargles once or twice per day -Sip water with urge to cough -Uses sugarless candy to keep your mouth moist -Can try delsym or mucinex as needed to help with cough - voice rest  Follow up in 2 weeks with Dr. Halford Chessman or Nurse Practitioner    Chesley Mires, MD [facility] Pager:  [phone] 07/08/2016, 10:58 AM

## 2016-07-08 NOTE — Progress Notes (Signed)
   Subjective:    Patient ID: Andrew Stevenson, male    DOB: [birth year], 71 y.o.   MRN: [id number]  HPI    Review of Systems  Constitutional: Negative for fever and unexpected weight change.  HENT: Positive for congestion, postnasal drip and rhinorrhea. Negative for dental problem, ear pain, nosebleeds, sinus pressure, sneezing, sore throat and trouble swallowing.        Mostly at night  Eyes: Negative for redness and itching.  Respiratory: Positive for cough, shortness of breath and wheezing. Negative for chest tightness.        Cough spasms >> worse at night  Cardiovascular: Negative for palpitations and leg swelling.  Gastrointestinal: Negative for nausea and vomiting.  Genitourinary: Negative for dysuria.  Musculoskeletal: Negative for joint swelling.  Skin: Negative for rash.  Neurological: Positive for dizziness. Negative for headaches.  Hematological: Does not bruise/bleed easily.  Psychiatric/Behavioral: Negative for dysphoric mood. The patient is not nervous/anxious.        Objective:   Physical Exam        Assessment & Plan:

## 2016-07-08 NOTE — Patient Instructions (Signed)
Nasal irrigation (saline nasal spray) daily Flonase 1 spray daily Chlorpheniramine 4 mg nightly Salt water gargles once or twice per day Sip water with urge to cough Uses sugarless candy to keep your mouth moist Can try delsym or mucinex as needed to help with cough  Follow up in 2 weeks with Dr. Halford Chessman or Nurse Practitioner

## 2016-07-09 ENCOUNTER — Encounter: Payer: Self-pay | Admitting: Family Medicine

## 2016-07-22 ENCOUNTER — Encounter: Payer: Self-pay | Admitting: Adult Health

## 2016-07-22 ENCOUNTER — Other Ambulatory Visit (INDEPENDENT_AMBULATORY_CARE_PROVIDER_SITE_OTHER): Payer: 59

## 2016-07-22 ENCOUNTER — Ambulatory Visit (INDEPENDENT_AMBULATORY_CARE_PROVIDER_SITE_OTHER): Payer: 59 | Admitting: Adult Health

## 2016-07-22 DIAGNOSIS — J40 Bronchitis, not specified as acute or chronic: Secondary | ICD-10-CM | POA: Diagnosis not present

## 2016-07-22 DIAGNOSIS — R490 Dysphonia: Secondary | ICD-10-CM

## 2016-07-22 DIAGNOSIS — R05 Cough: Secondary | ICD-10-CM | POA: Diagnosis not present

## 2016-07-22 DIAGNOSIS — R053 Chronic cough: Secondary | ICD-10-CM

## 2016-07-22 DIAGNOSIS — R059 Cough, unspecified: Secondary | ICD-10-CM

## 2016-07-22 LAB — CBC WITH DIFFERENTIAL/PLATELET
Basophils Absolute: 0 10*3/uL (ref 0.0–0.1)
Basophils Relative: 0.6 % (ref 0.0–3.0)
Eosinophils Absolute: 0.1 10*3/uL (ref 0.0–0.7)
Eosinophils Relative: 2.9 % (ref 0.0–5.0)
HCT: 45.2 % (ref 39.0–52.0)
Hemoglobin: 14.9 g/dL (ref 13.0–17.0)
Lymphocytes Relative: 20.6 % (ref 12.0–46.0)
Lymphs Abs: 0.8 10*3/uL (ref 0.7–4.0)
MCHC: 32.9 g/dL (ref 30.0–36.0)
MCV: 92.5 fl (ref 78.0–100.0)
Monocytes Absolute: 0.7 10*3/uL (ref 0.1–1.0)
Monocytes Relative: 16.4 % — ABNORMAL HIGH (ref 3.0–12.0)
Neutro Abs: 2.4 10*3/uL (ref 1.4–7.7)
Neutrophils Relative %: 59.5 % (ref 43.0–77.0)
Platelets: 100 10*3/uL — ABNORMAL LOW (ref 150.0–400.0)
RBC: 4.89 Mil/uL (ref 4.22–5.81)
RDW: 14.7 % (ref 11.5–15.5)
WBC: 4.1 10*3/uL (ref 4.0–10.5)

## 2016-07-22 MED ORDER — BENZONATATE 200 MG PO CAPS: 200.0000 mg | ORAL_CAPSULE | Freq: Three times a day (TID) | ORAL | 1 refills | Status: DC | PRN

## 2016-07-22 MED ORDER — HYDROCODONE-HOMATROPINE 5-1.5 MG/5ML PO SYRP: 5.0000 mL | ORAL_SOLUTION | Freq: Four times a day (QID) | ORAL | 0 refills | Status: DC | PRN

## 2016-07-22 MED ORDER — PREDNISONE 10 MG PO TABS: ORAL_TABLET | ORAL | 0 refills | Status: DC

## 2016-07-22 NOTE — Assessment & Plan Note (Signed)
Chronic hoarseness  Smokeless tobacco use  Refer to ENT

## 2016-07-22 NOTE — Assessment & Plan Note (Signed)
Cyclical cough - may be a post bronchitic cough w/ AR /GERD triggers He does use smokeless tobacco daily w/ chronic hoarseness . Also has excessive soda use .  Will treat for aggressive cough control , GERD/AR prevention  Steroid challenge.  Refer to ENT .  Would like to get spirometry/FENO but pt can not stop coughing long enough for accurate measure.   Plan  Patient Instructions  Labs today .  Refer to ENT for hoarseness/chronic cough .  Begin Delsym 2 tsp Twice daily  For cough . (over the counter )  Begin Tessalon Three times a day  For cough .  May use Hydromet 1 tsp At bedtime As needed for severe cough , may make you sleepy .  Begin Chlortrimeton 4mg  2 At bedtime  . (this is over the counter)  Take Xyzal 5mg  daily in am .  Use sips of water to soothe throat , try not to throat clearing.  Continue on Prilosec 20mg  daily  Voice rest, limited talking . No singing or whispering .  Prednisone taper over next week.  Stop drinking colas  Begin Pepcid 20mg  At bedtime  . (this is over the counter )  follow up Dr. Halford Chessman  In 3--4 weeks and As needed   Please contact office for sooner follow up if symptoms do not improve or worsen or seek emergency care

## 2016-07-22 NOTE — Addendum Note (Signed)
Addended by: Parke Poisson E on: 07/22/2016 11:51 AM   Modules accepted: Orders

## 2016-07-22 NOTE — Progress Notes (Signed)
@Patient  ID: Andrew Stevenson, male    DOB: [birth year], 71 y.o.   MRN: [id number]  Chief Complaint  Patient presents with  . Follow-up    Cough     Referring provider: Tammi Sou, MD  HPI: 71 year old male never smoker seen for pulmonary consult for chronic cough 07/08/2016 Uses smokeless tobacco  TEST  CXR 05/29/16 report w/ CM , nad.   07/22/2016 Follow up ; Chronic cough  Pt returns for 2 week follow up . Pt was seen last ov for pulmonary consult for cough for 4 months . Says he intially dx w/ bronchitis , tx w/ abx and steroids but cough has not went away .  Cough is all day /night long. Interferes with social activities and sleep .  He was started on Flonase , Chlortrimeton and Delsym last ov . Unclear if he started on these.  Has been using some whiskey at bedtime to help with cough . He does not like to drink etoh but it is only thing that will help his cough .  Denies reflex. , On prilosec daily.  Feels something stuck in throat, throat tickle. And hoarseness for last few months . Uses dip daily .  Drinks 10 colas a day .  Denies fever, chest pain, orthopnea, hemoptyiss, edema , rash , joint swelling .    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 (some sanding)  No extensive travel. Has cat /dog. No birds/chickens.    Allergies  Allergen Reactions  . Penicillins Other (See Comments)    Unknown childhood reaction    Immunization History  Administered Date(s) Administered  . Pneumococcal Polysaccharide-23 06/30/2011  . Zoster 06/30/2011    Past Medical History:  Diagnosis Date  . Adult ADHD    adderall caused elevated bp  . Anxiety and depression   . Arthritis    C-spine  . At risk for sleep apnea    STOP-BANG= 5      SENT TO PCP 11-05-2014  . Chronic pain   . Erectile dysfunction   . Heart murmur   . History of concussion    june 2015--  hit head w/ syncope---  no residual  . History of memory loss    per prior PCP  records.  . History of syncope    june 2015 --syncope w/ fall and pt states has happened 5 more times , the last one being jan 2016,,  states had battery of test and unknown cause  . Hyperlipidemia 02/2016   statin recommended 02/2016  . Hypertension   . IFG (impaired fasting glucose) 02/2016   HbA1c 5 %.  . Insomnia   . Lower urinary tract symptoms (LUTS)   . Mild aortic stenosis    per echo valve area 1.78cm^2  . Myalgia and myositis, unspecified   . Prostate cancer [facility]) urologist-  dr ottelin/  oncologist-  dr Valere Dross   2016 Stage T1c,  Gleason 4+3,  PSA 5.26, vol 49.19cc  . Restless leg   . Upper airway cough syndrome 2018   Dr. Halford Chessman  . Vertebrobasilar insufficiency    per neurologist note dr Felecia Shelling-- this is possible causing syncope-- no tx just be careful getting up from sitting position  . Wears glasses     Tobacco History: History  Smoking Status  . Never Smoker  Smokeless Tobacco  . Current User  . Types: Snuff    Comment: using snuff x 25 years   Ready to quit:  Not Answered Counseling given: Not Answered   Outpatient Encounter Prescriptions as of 07/22/2016  Medication Sig  . albuterol (VENTOLIN HFA) 108 (90 Base) MCG/ACT inhaler Inhale 1-2 puffs into the lungs every 6 (six) hours as needed for wheezing or shortness of breath.  Marland Kitchen aspirin EC 81 MG tablet Take 81 mg by mouth daily.  Marland Kitchen atorvastatin (LIPITOR) 20 MG tablet TAKE 1 TABLET (20 MG TOTAL) BY MOUTH DAILY.  Marland Kitchen b complex vitamins capsule Take 1 capsule by mouth daily.  . benzonatate (TESSALON) 200 MG capsule Take 1 capsule (200 mg total) by mouth 2 (two) times daily as needed for cough.  . chlorpheniramine (CHLOR-TRIMETON) 4 MG tablet Take 1 tablet (4 mg total) by mouth at bedtime.  Marland Kitchen CIALIS 5 MG tablet Take 1 tablet by mouth daily.  . fluticasone (CUTIVATE) 0.05 % cream Apply topically 2 (two) times daily.  . fluticasone (FLONASE) 50 MCG/ACT nasal spray Place 2 sprays into both nostrils daily.  Marland Kitchen  levocetirizine (XYZAL) 5 MG tablet Take 5 mg by mouth every evening.  . meclizine (ANTIVERT) 25 MG tablet Take 1 tablet (25 mg total) by mouth 3 (three) times daily as needed for dizziness.  . metoprolol tartrate (LOPRESSOR) 25 MG tablet TAKE 1 (ONE) TABLET BY MOUTH TWO TIMES DAILY  . Multiple Vitamin (MULTIVITAMIN) tablet Take 1 tablet by mouth daily.  Marland Kitchen omeprazole (PRILOSEC) 40 MG capsule Take 1 capsule (40 mg total) by mouth daily.  Marland Kitchen rOPINIRole (REQUIP) 1 MG tablet Take 1 tablet (1 mg total) by mouth 3 (three) times daily.  . Turmeric 500 MG TABS Take 1 tablet by mouth daily.  . valsartan (DIOVAN) 160 MG tablet Take 160 mg by mouth every morning.  Marland Kitchen HYDROcodone-homatropine (HYDROMET) 5-1.5 MG/5ML syrup Take 5 mLs by mouth every 6 (six) hours as needed.  . predniSONE (DELTASONE) 10 MG tablet 4 tabs for 2 days, then 3 tabs for 2 days, 2 tabs for 2 days, then 1 tab for 2 days, then stop   No facility-administered encounter medications on file as of 07/22/2016.      Review of Systems  Constitutional:   No  weight loss, night sweats,  Fevers, chills, fatigue, or  lassitude.  HEENT:   No headaches,  Difficulty swallowing,  Tooth/dental problems, or  Sore throat,                No sneezing, itching, ear ache, + nasal congestion, post nasal drip,   CV:  No chest pain,  Orthopnea, PND, swelling in lower extremities, anasarca, dizziness, palpitations, syncope.   GI  No heartburn, indigestion, abdominal pain, nausea, vomiting, diarrhea, change in bowel habits, loss of appetite, bloody stools.   Resp:   No chest wall deformity  Skin: no rash or lesions.  GU: no dysuria, change in color of urine, no urgency or frequency.  No flank pain, no hematuria   MS:  No joint pain or swelling.  No decreased range of motion.  No back pain.    Physical Exam  BP 124/76 (BP Location: Left Arm, Cuff Size: Normal)   Pulse 67   Ht 6\' 4"  (1.93 m)   Wt 266 lb 12.8 oz (121 kg)   SpO2 96%   BMI 32.48  kg/m   GEN: A/Ox3; pleasant , NAD, obese ,  Barking cough throughout visit .    HEENT:  [place]/AT,  EACs-clear, TMs-wnl, NOSE-clear, THROAT-clear, no lesions, no postnasal drip or exudate noted.   NECK:  Supple w/ fair ROM;  no JVD; normal carotid impulses w/o bruits; no thyromegaly or nodules palpated; no lymphadenopathy.    RESP  Clear  P & A; w/o, wheezes/ rales/ or rhonchi. no accessory muscle use, no dullness to percussion  CARD:  RRR, no m/r/g, no peripheral edema, pulses intact, no cyanosis or clubbing.  GI:   Soft & nt; nml bowel sounds; no organomegaly or masses detected.   Musco: Warm bil, no deformities or joint swelling noted.   Neuro: alert, no focal deficits noted.    Skin: Warm, no lesions or rashes    Lab Results:  CBC    Component Value Date/Time   WBC 4.2 02/27/2016 0851   RBC 5.06 02/27/2016 0851   HGB 15.7 02/27/2016 0851   HCT 47.6 02/27/2016 0851   PLT 154 02/27/2016 0851   MCV 94.1 02/27/2016 0851   MCH 31.0 02/27/2016 0851   MCHC 33.0 02/27/2016 0851   RDW 14.1 02/27/2016 0851   LYMPHSABS 798 (L) 02/27/2016 0851   MONOABS 672 02/27/2016 0851   EOSABS 168 02/27/2016 0851   BASOSABS 0 02/27/2016 0851    BMET    Component Value Date/Time   NA 143 02/27/2016 0851   K 4.6 02/27/2016 0851   CL 109 02/27/2016 0851   CO2 24 02/27/2016 0851   GLUCOSE 105 (H) 02/27/2016 0851   BUN 20 02/27/2016 0851   CREATININE 1.24 (H) 02/27/2016 0851   CALCIUM 9.2 02/27/2016 0851   GFRNONAA >60 05/10/2015 1356   GFRAA >60 05/10/2015 1356    BNP No results found for: BNP  ProBNP No results found for: PROBNP  Imaging: No results found.   Assessment & Plan:   Chronic cough Cyclical cough - may be a post bronchitic cough w/ AR /GERD triggers He does use smokeless tobacco daily w/ chronic hoarseness . Also has excessive soda use .  Will treat for aggressive cough control , GERD/AR prevention  Steroid challenge.  Refer to ENT .  Would like to get  spirometry/FENO but pt can not stop coughing long enough for accurate measure.   Plan  Patient Instructions  Labs today .  Refer to ENT for hoarseness/chronic cough .  Begin Delsym 2 tsp Twice daily  For cough . (over the counter )  Begin Tessalon Three times a day  For cough .  May use Hydromet 1 tsp At bedtime As needed for severe cough , may make you sleepy .  Begin Chlortrimeton 4mg  2 At bedtime  . (this is over the counter)  Take Xyzal 5mg  daily in am .  Use sips of water to soothe throat , try not to throat clearing.  Continue on Prilosec 20mg  daily  Voice rest, limited talking . No singing or whispering .  Prednisone taper over next week.  Stop drinking colas  Begin Pepcid 20mg  At bedtime  . (this is over the counter )  follow up Dr. Halford Chessman  In 3--4 weeks and As needed   Please contact office for sooner follow up if symptoms do not improve or worsen or seek emergency care        Hoarseness Chronic hoarseness  Smokeless tobacco use  Refer to ENT      Rexene Edison, NP 07/22/2016

## 2016-07-22 NOTE — Patient Instructions (Addendum)
Labs today .  Refer to ENT for hoarseness/chronic cough .  Begin Delsym 2 tsp Twice daily  For cough . (over the counter )  Begin Tessalon Three times a day  For cough .  May use Hydromet 1 tsp At bedtime As needed for severe cough , may make you sleepy .  Begin Chlortrimeton 4mg  2 At bedtime  . (this is over the counter)  Take Xyzal 5mg  daily in am .  Use sips of water to soothe throat , try not to throat clearing.  Continue on Prilosec 20mg  daily  Voice rest, limited talking . No singing or whispering .  Prednisone taper over next week.  Stop drinking colas  Begin Pepcid 20mg  At bedtime  . (this is over the counter )  follow up Dr. Halford Chessman  In 3--4 weeks and As needed   Please contact office for sooner follow up if symptoms do not improve or worsen or seek emergency care

## 2016-07-22 NOTE — Progress Notes (Signed)
I have reviewed and agree with assessment/plan.  Chesley Mires, MD [facility] Pulmonary/Critical Care 07/22/2016, 12:19 PM Pager:  [phone]

## 2016-07-23 ENCOUNTER — Encounter: Payer: Self-pay | Admitting: Family Medicine

## 2016-07-23 LAB — RESPIRATORY ALLERGY PROFILE REGION II ~~LOC~~
Allergen, A. alternata, m6: <0.1 kU/L
Allergen, C. Herbarum, M2: <0.1 kU/L
Allergen, Cedar tree, t12: <0.1 kU/L
Allergen, Comm Silver Birch, t9: <0.1 kU/L
Allergen, Cottonwood, t14: <0.1 kU/L
Allergen, D pternoyssinus,d7: <0.1 kU/L
Allergen, Mouse Urine Protein, e78: <0.1 kU/L
Allergen, Mulberry, t76: <0.1 kU/L
Allergen, Oak,t7: <0.1 kU/L
Allergen, P. notatum, m1: <0.1 kU/L
Aspergillus fumigatus, m3: <0.1 kU/L
Bermuda Grass: <0.1 kU/L
Box Elder IgE: <0.1 kU/L
Cat Dander: <0.1 kU/L
Cockroach: 0.18 kU/L — ABNORMAL HIGH
Common Ragweed: <0.1 kU/L
D. farinae: <0.1 kU/L
Dog Dander: <0.1 kU/L
Elm IgE: <0.1 kU/L
IgE (Immunoglobulin E), Serum: 87 kU/L (ref <115)
Johnson Grass: <0.1 kU/L
Pecan/Hickory Tree IgE: <0.1 kU/L
Rough Pigweed  IgE: <0.1 kU/L
Sheep Sorrel IgE: <0.1 kU/L
Timothy Grass: <0.1 kU/L

## 2016-08-05 ENCOUNTER — Encounter: Payer: Self-pay | Admitting: Adult Health

## 2016-08-05 ENCOUNTER — Ambulatory Visit (INDEPENDENT_AMBULATORY_CARE_PROVIDER_SITE_OTHER): Payer: 59 | Admitting: Adult Health

## 2016-08-05 DIAGNOSIS — R05 Cough: Secondary | ICD-10-CM | POA: Diagnosis not present

## 2016-08-05 DIAGNOSIS — R053 Chronic cough: Secondary | ICD-10-CM

## 2016-08-05 MED ORDER — HYDROCODONE-HOMATROPINE 5-1.5 MG/5ML PO SYRP: 5.0000 mL | ORAL_SOLUTION | Freq: Four times a day (QID) | ORAL | 0 refills | Status: DC | PRN

## 2016-08-05 NOTE — Assessment & Plan Note (Signed)
Upper airway cough syndrome with improvement with tx aimed at cough control, AR /GERD prevention .  Advised only 1 refill of hydromet going forward. Advised on narcotic side effects/ complications and limited short term use.    Plan  Patient Instructions  Continue on Delsym 2 tsp Twice daily  For cough . (over the counter )  Tessalon Three times a day  For cough .  May use Hydromet 1 tsp At bedtime As needed for severe cough , may make you sleepy .  Chlortrimeton 4mg  2 At bedtime  . (this is over the counter)  Take Xyzal 5mg  daily in am .  Use sips of water to soothe throat , try not to throat clearing.  Continue on Prilosec 20mg  daily  Voice rest, limited talking . No singing or whispering .  Pepcid 20mg  At bedtime  . (this is over the counter )  follow up Dr. Halford Chessman  In 3--Months and As needed   Please contact office for sooner follow up if symptoms do not improve or worsen or seek emergency care

## 2016-08-05 NOTE — Patient Instructions (Addendum)
Continue on Delsym 2 tsp Twice daily  For cough . (over the counter )  Tessalon Three times a day  For cough .  May use Hydromet 1 tsp At bedtime As needed for severe cough , may make you sleepy .  Chlortrimeton 4mg  2 At bedtime  . (this is over the counter)  Take Xyzal 5mg  daily in am .  Use sips of water to soothe throat , try not to throat clearing.  Continue on Prilosec 20mg  daily  Voice rest, limited talking . No singing or whispering .  Pepcid 20mg  At bedtime  . (this is over the counter )  follow up Dr. Halford Chessman  In 3--Months and As needed   Please contact office for sooner follow up if symptoms do not improve or worsen or seek emergency care

## 2016-08-05 NOTE — Progress Notes (Signed)
@Patient  ID: Andrew Stevenson, male    DOB: 1945-03-27, 71 y.o.   MRN: [id number]  Chief Complaint  Patient presents with  . Follow-up    Cough     Referring provider: Tammi Sou, MD  HPI: 71 year old male never smoker seen for pulmonary consult for chronic cough 07/08/2016 Uses smokeless tobacco  TEST  CXR 05/29/16 report w/ CM , nad.   08/05/2016 Follow up : Chronic cough  Pt returns for 2 week follow up . Seen last visit with flare of cough . He was started on delsym , tessalon and xyzal w/ PPI/pepcid . He is feeling so much better. Hoarseness is resolved. Cough is decreased . 80% better. No fever or chest pain orthopnea or edema  He says he can now sleep finally . Cough is resolved at night time with hydromet.      Allergies  Allergen Reactions  . Penicillins Other (See Comments)    Unknown childhood reaction    Immunization History  Administered Date(s) Administered  . Pneumococcal Polysaccharide-23 06/30/2011  . Zoster 06/30/2011    Past Medical History:  Diagnosis Date  . Adult ADHD    adderall caused elevated bp  . Anxiety and depression   . Arthritis    C-spine  . At risk for sleep apnea    STOP-BANG= 5      SENT TO PCP 11-05-2014  . Chronic pain   . Erectile dysfunction   . Heart murmur   . History of concussion    june 2015--  hit head w/ syncope---  no residual  . History of memory loss    per prior PCP records.  . History of syncope    june 2015 --syncope w/ fall and pt states has happened 5 more times , the last one being jan 2016,,  states had battery of test and unknown cause  . Hyperlipidemia 02/2016   statin recommended 02/2016  . Hypertension   . IFG (impaired fasting glucose) 02/2016   HbA1c 5 %.  . Insomnia   . Lower urinary tract symptoms (LUTS)   . Mild aortic stenosis    per echo valve area 1.78cm^2  . Myalgia and myositis, unspecified   . Prostate cancer [facility]) urologist-  dr ottelin/  oncologist-  dr Valere Dross   2016 Stage  T1c,  Gleason 4+3,  PSA 5.26, vol 49.19cc  . Restless leg   . Upper airway cough syndrome 2018   Dr. Halford Chessman.  Cough therapy maximized at f/u 07/2016; referred to ENT by Pulm.  . Vertebrobasilar insufficiency    per neurologist note dr Felecia Shelling-- this is possible causing syncope-- no tx just be careful getting up from sitting position  . Wears glasses     Tobacco History: History  Smoking Status  . Never Smoker  Smokeless Tobacco  . Current User  . Types: Snuff    Comment: using snuff x 25 years   Ready to quit: Not Answered Counseling given: Not Answered   Outpatient Encounter Prescriptions as of 08/05/2016  Medication Sig  . albuterol (VENTOLIN HFA) 108 (90 Base) MCG/ACT inhaler Inhale 1-2 puffs into the lungs every 6 (six) hours as needed for wheezing or shortness of breath.  Marland Kitchen aspirin EC 81 MG tablet Take 81 mg by mouth daily.  Marland Kitchen atorvastatin (LIPITOR) 20 MG tablet TAKE 1 TABLET (20 MG TOTAL) BY MOUTH DAILY.  Marland Kitchen b complex vitamins capsule Take 1 capsule by mouth daily.  . benzonatate (TESSALON) 200 MG capsule Take 1  capsule (200 mg total) by mouth 3 (three) times daily as needed for cough.  . chlorpheniramine (CHLOR-TRIMETON) 4 MG tablet Take 1 tablet (4 mg total) by mouth at bedtime.  Marland Kitchen CIALIS 5 MG tablet Take 1 tablet by mouth daily.  . fluticasone (CUTIVATE) 0.05 % cream Apply topically 2 (two) times daily.  . fluticasone (FLONASE) 50 MCG/ACT nasal spray Place 2 sprays into both nostrils daily.  Marland Kitchen levocetirizine (XYZAL) 5 MG tablet Take 5 mg by mouth every evening.  . meclizine (ANTIVERT) 25 MG tablet Take 1 tablet (25 mg total) by mouth 3 (three) times daily as needed for dizziness.  . metoprolol tartrate (LOPRESSOR) 25 MG tablet TAKE 1 (ONE) TABLET BY MOUTH TWO TIMES DAILY  . Multiple Vitamin (MULTIVITAMIN) tablet Take 1 tablet by mouth daily.  Marland Kitchen omeprazole (PRILOSEC) 40 MG capsule Take 1 capsule (40 mg total) by mouth daily.  Marland Kitchen rOPINIRole (REQUIP) 1 MG tablet Take 1 tablet (1  mg total) by mouth 3 (three) times daily.  . Turmeric 500 MG TABS Take 1 tablet by mouth daily.  . valsartan (DIOVAN) 160 MG tablet Take 160 mg by mouth every morning.  Marland Kitchen HYDROcodone-homatropine (HYDROMET) 5-1.5 MG/5ML syrup Take 5 mLs by mouth every 6 (six) hours as needed. (Patient not taking: Reported on 08/05/2016)  . HYDROcodone-homatropine (HYDROMET) 5-1.5 MG/5ML syrup Take 5 mLs by mouth every 6 (six) hours as needed.  . [DISCONTINUED] predniSONE (DELTASONE) 10 MG tablet 4 tabs for 2 days, then 3 tabs for 2 days, 2 tabs for 2 days, then 1 tab for 2 days, then stop (Patient not taking: Reported on 08/05/2016)   No facility-administered encounter medications on file as of 08/05/2016.      Review of Systems  Constitutional:   No  weight loss, night sweats,  Fevers, chills, fatigue, or  lassitude.  HEENT:   No headaches,  Difficulty swallowing,  Tooth/dental problems, or  Sore throat,                No sneezing, itching, ear ache,  +nasal congestion, post nasal drip,   CV:  No chest pain,  Orthopnea, PND, swelling in lower extremities, anasarca, dizziness, palpitations, syncope.   GI  No heartburn, indigestion, abdominal pain, nausea, vomiting, diarrhea, change in bowel habits, loss of appetite, bloody stools.   Resp:    No chest wall deformity  Skin: no rash or lesions.  GU: no dysuria, change in color of urine, no urgency or frequency.  No flank pain, no hematuria   MS:  No joint pain or swelling.  No decreased range of motion.  No back pain.    Physical Exam  BP 114/68 (BP Location: Left Arm, Cuff Size: Normal)   Pulse 67   Ht 6\' 4"  (1.93 m)   Wt 268 lb 12.8 oz (121.9 kg)   SpO2 96%   BMI 32.72 kg/m   GEN: A/Ox3; pleasant , NAD, obese    HEENT:  [place]/AT,  EACs-clear, TMs-wnl, NOSE-clear, THROAT-clear, no lesions, no postnasal drip or exudate noted.   NECK:  Supple w/ fair ROM; no JVD; normal carotid impulses w/o bruits; no thyromegaly or nodules palpated; no  lymphadenopathy.    RESP  Clear  P & A; w/o, wheezes/ rales/ or rhonchi. no accessory muscle use, no dullness to percussion  CARD:  RRR, no m/r/g, no peripheral edema, pulses intact, no cyanosis or clubbing.  GI:   Soft & nt; nml bowel sounds; no organomegaly or masses detected.   Musco:  Warm bil, no deformities or joint swelling noted.   Neuro: alert, no focal deficits noted.    Skin: Warm, no lesions or rashes  Psych:  No change in mood or affect. No depression or anxiety.  No memory loss.  Lab Results:  BNP No results found for: BNP  ProBNP No results found for: PROBNP  Imaging: No results found.   Assessment & Plan:   Chronic cough Upper airway cough syndrome with improvement with tx aimed at cough control, AR /GERD prevention .  Advised only 1 refill of hydromet going forward. Advised on narcotic side effects/ complications and limited short term use.    Plan  Patient Instructions  Continue on Delsym 2 tsp Twice daily  For cough . (over the counter )  Tessalon Three times a day  For cough .  May use Hydromet 1 tsp At bedtime As needed for severe cough , may make you sleepy .  Chlortrimeton 4mg  2 At bedtime  . (this is over the counter)  Take Xyzal 5mg  daily in am .  Use sips of water to soothe throat , try not to throat clearing.  Continue on Prilosec 20mg  daily  Voice rest, limited talking . No singing or whispering .  Pepcid 20mg  At bedtime  . (this is over the counter )  follow up Dr. Halford Chessman  In 3--Months and As needed   Please contact office for sooner follow up if symptoms do not improve or worsen or seek emergency care           Rexene Edison, NP 08/05/2016

## 2016-08-05 NOTE — Progress Notes (Signed)
I have reviewed and agree with assessment/plan.  Chesley Mires, MD [facility] Pulmonary/Critical Care 08/05/2016, 1:32 PM Pager:  [phone]

## 2016-08-07 ENCOUNTER — Telehealth: Payer: Self-pay | Admitting: Adult Health

## 2016-08-07 NOTE — Telephone Encounter (Signed)
Called spoke with patient to discuss CBCD and Allergy Profile results  When speaking with patient about lab results he reported that he experienced nausea and itching when taking his Hydromet given at the 5.23.18 office visit.  Pt reported he immediately discontinued this and his symptoms resolved.  Pt did take this medication previously (given at the 5.9.18 office visit) and did not have any adverse reactions.  Pt is not asking for additional recommendations at this time, reported this is only a FYI.  Forwarding to TP as FYI

## 2016-08-09 NOTE — Telephone Encounter (Signed)
Remain off hydrocodone and place on allergy list  Use rest of ov instructions for cough control .  Please contact office for sooner follow up if symptoms do not improve or worsen or seek emergency care

## 2016-08-11 NOTE — Telephone Encounter (Signed)
Pt is aware of TP's recommendations and voiced his understanding. Hydrocodone has been added to allergies per TP. Nothing further needed.

## 2016-08-13 ENCOUNTER — Encounter: Payer: Self-pay | Admitting: Family Medicine

## 2016-08-19 ENCOUNTER — Encounter: Payer: Self-pay | Admitting: Family Medicine

## 2016-08-19 ENCOUNTER — Ambulatory Visit (INDEPENDENT_AMBULATORY_CARE_PROVIDER_SITE_OTHER): Payer: 59 | Admitting: Family Medicine

## 2016-08-19 VITALS — BP 123/73 | HR 66 | Temp 98.0°F | Resp 16 | Ht 76.0 in | Wt 267.2 lb

## 2016-08-19 DIAGNOSIS — E7800 Pure hypercholesterolemia, unspecified: Secondary | ICD-10-CM

## 2016-08-19 DIAGNOSIS — E878 Other disorders of electrolyte and fluid balance, not elsewhere classified: Secondary | ICD-10-CM | POA: Diagnosis not present

## 2016-08-19 DIAGNOSIS — E785 Hyperlipidemia, unspecified: Secondary | ICD-10-CM

## 2016-08-19 DIAGNOSIS — G2581 Restless legs syndrome: Secondary | ICD-10-CM

## 2016-08-19 DIAGNOSIS — I10 Essential (primary) hypertension: Secondary | ICD-10-CM | POA: Diagnosis not present

## 2016-08-19 DIAGNOSIS — H5315 Visual distortions of shape and size: Secondary | ICD-10-CM

## 2016-08-19 DIAGNOSIS — N182 Chronic kidney disease, stage 2 (mild): Secondary | ICD-10-CM

## 2016-08-19 MED ORDER — CLONAZEPAM 0.5 MG PO TABS: ORAL_TABLET | ORAL | 5 refills | Status: DC

## 2016-08-19 NOTE — Addendum Note (Signed)
Addended by: Tammi Sou on: 08/19/2016 05:20 PM   Modules accepted: Level of Service

## 2016-08-19 NOTE — Progress Notes (Addendum)
OFFICE VISIT  08/19/2016   CC:  Chief Complaint  Patient presents with  . Follow-up    HTN, pt is not fasting.    HPI:    Patient is a 71 y.o. Caucasian male who presents for f/u HTN and hyperlipidemia, CRI stage II/III. Also, I recently got correspondence from his optometrist, Dr. Macarthur Critchley, and due to longstanding complaint of visual distortion and intermittent dizziness. 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  He stated that no further ocular treatment is indicated. He suggested I look into checking carotid doppler as a means of checking on adequate blood flow to the brain (as further eval of his symptoms mentioned above).  He says his chronic dizziness (disequilibrium with rare episode of vertigo, often feels nauseated but no vomiting) and vision distortion (varies between blurry vision and double vision) has progressively worsened over the last 2 years since he last had brain imaging and carotid dopplers. These problems came on around the same time.  HTN: no home bp monitoring.  Compliant with meds.  Occ mild HA--no meds needed. No CP, no SOB, no LE swelling.  HLD: tolerating atorv that we started him on 6 mo ago.  CRI: seldom takes NSAIDs.  Tries to focus on hydration.  He complains that his RLS symptoms are not adequately controlled by ropinirole anymore.  Has been on this med long term.  Asks if anything further can be done regarding this problem.  Past Medical History:  Diagnosis Date  . Adult ADHD    adderall caused elevated bp  . Anxiety and depression   . Arthritis    C-spine  . At risk for sleep apnea    STOP-BANG= 5      SENT TO PCP 11-05-2014  . Chronic pain   . Erectile dysfunction   . Heart murmur   . History of concussion    june 2015--  hit head w/ syncope---  no residual  . History of memory loss    per prior PCP records.  . History of syncope    june 2015 --syncope w/ fall and pt states has happened 5  more times , the last one being jan 2016,,  states had battery of test and unknown cause  . Hyperlipidemia 02/2016   statin recommended 02/2016  . Hypertension   . IFG (impaired fasting glucose) 02/2016   HbA1c 5 %.  . Insomnia   . Lower urinary tract symptoms (LUTS)   . Mild aortic stenosis    per echo valve area 1.78cm^2  . Myalgia and myositis, unspecified   . Prostate cancer [facility]) urologist-  dr ottelin/  oncologist-  dr Valere Dross   2016 Stage T1c,  Gleason 4+3,  PSA 5.26, vol 49.19cc  . Restless leg   . Thrombocytopenia (HCC)    >100K.  Marland Kitchen Upper airway cough syndrome 2018   Dr. Halford Chessman.  Cough therapy maximized at f/u 07/2016; referred to ENT by Pulm.  Allergy testing OK. Improved as of 08/05/16 pulm f/u.  Marland Kitchen Vertebrobasilar insufficiency    per neurologist note dr Felecia Shelling-- this is possible causing syncope-- no tx just be careful getting up from sitting position  . Wears glasses     Past Surgical History:  Procedure Laterality Date  . Carotid Dopplers  08/01/2014   NORMAL (Dr. Felecia Shelling)  . CERVICAL FUSION  x1  last one 2010  . COLONOSCOPY  02/2004  . EYE SURGERY Bilateral 2013   5  right eye, 3 left eye surgeries (including cataract extraction's iol w/ lens implant, other surgery's for post-op lens fungal infection and floaters)  . GOLD SEED IMPLANT N/A 02/22/2015   Procedure: GOLD SEED IMPLANT;  Surgeon: Kathie Rhodes, MD;  Location: [facility];  Service: Urology;  Laterality: N/A;  . LEFT THIGH QUADRICEP MUSCLE BIOPSY'S  12-31-2005  . LUMBAR FUSION  x2  last one 2010  . MRA/MRI w/out contrast--brain  12/2014   mild, age-related chronic microvascular ischemic change (Dr. Felecia Shelling).  Marland Kitchen RADIOACTIVE SEED IMPLANT N/A 05/17/2015   Procedure: RADIOACTIVE SEED IMPLANT/BRACHYTHERAPY IMPLANT;  Surgeon: Kathie Rhodes, MD;  Location: [place];  Service: Urology;  Laterality: N/A;  . TRANSRECTAL ULTRASOUND N/A 11/12/2014   Procedure: TRANSRECTAL ULTRASOUND AND BIOPSY  ;   Surgeon: Kathie Rhodes, MD;  Location: [facility];  Service: Urology;  Laterality: N/A;  . TRANSTHORACIC ECHOCARDIOGRAM  03-29-2014   mild concentric LVH/  ef 61-95%/  grade I diastolic dysfunction/  AV poorly visualized, possibly bicupid;  mild AS,  valve area 1.78cm^2,  mean grandient 18mm Hg/  trivial TR    Outpatient Medications Prior to Visit  Medication Sig Dispense Refill  . albuterol (VENTOLIN HFA) 108 (90 Base) MCG/ACT inhaler Inhale 1-2 puffs into the lungs every 6 (six) hours as needed for wheezing or shortness of breath. 1 Inhaler 0  . aspirin EC 81 MG tablet Take 81 mg by mouth daily.    Marland Kitchen atorvastatin (LIPITOR) 20 MG tablet TAKE 1 TABLET (20 MG TOTAL) BY MOUTH DAILY. 30 tablet 2  . b complex vitamins capsule Take 1 capsule by mouth daily.    Marland Kitchen CIALIS 5 MG tablet Take 1 tablet by mouth daily.    . fluticasone (CUTIVATE) 0.05 % cream Apply topically 2 (two) times daily. 30 g 3  . meclizine (ANTIVERT) 25 MG tablet Take 1 tablet (25 mg total) by mouth 3 (three) times daily as needed for dizziness. 90 tablet 6  . metoprolol tartrate (LOPRESSOR) 25 MG tablet TAKE 1 (ONE) TABLET BY MOUTH TWO TIMES DAILY  1  . Multiple Vitamin (MULTIVITAMIN) tablet Take 1 tablet by mouth daily.    Marland Kitchen rOPINIRole (REQUIP) 1 MG tablet Take 1 tablet (1 mg total) by mouth 3 (three) times daily. 270 tablet 3  . Turmeric 500 MG TABS Take 1 tablet by mouth daily.    . valsartan (DIOVAN) 160 MG tablet Take 160 mg by mouth every morning.    . benzonatate (TESSALON) 200 MG capsule Take 1 capsule (200 mg total) by mouth 3 (three) times daily as needed for cough. (Patient not taking: Reported on 08/19/2016) 45 capsule 1  . chlorpheniramine (CHLOR-TRIMETON) 4 MG tablet Take 1 tablet (4 mg total) by mouth at bedtime. (Patient not taking: Reported on 08/19/2016)  0  . fluticasone (FLONASE) 50 MCG/ACT nasal spray Place 2 sprays into both nostrils daily. (Patient not taking: Reported on 08/19/2016) 16 g 2  .  HYDROcodone-homatropine (HYDROMET) 5-1.5 MG/5ML syrup Take 5 mLs by mouth every 6 (six) hours as needed. (Patient not taking: Reported on 08/05/2016) 240 mL 0  . HYDROcodone-homatropine (HYDROMET) 5-1.5 MG/5ML syrup Take 5 mLs by mouth every 6 (six) hours as needed. (Patient not taking: Reported on 08/19/2016) 240 mL 0  . levocetirizine (XYZAL) 5 MG tablet Take 5 mg by mouth every evening.    Marland Kitchen omeprazole (PRILOSEC) 40 MG capsule Take 1 capsule (40 mg total) by mouth daily. (Patient not taking: Reported on 08/19/2016) 30  capsule 3   No facility-administered medications prior to visit.     Allergies  Allergen Reactions  . Hydrocodone Nausea Only  . Penicillins Other (See Comments)    Unknown childhood reaction    ROS As per HPI  PE: Blood pressure 123/73, pulse 66, temperature 98 F (36.7 C), temperature source Oral, resp. rate 16, height 6\' 4"  (1.93 m), weight 267 lb 4 oz (121.2 kg), SpO2 94 %. Gen: Alert, well appearing.  Patient is oriented to person, place, time, and situation. AFFECT: pleasant, lucid thought and speech. CV: RRR, no m/r/g.   LUNGS: CTA bilat, nonlabored resps, good aeration in all lung fields. Neuro: CN 2-12 intact bilaterally, strength 5/5 in proximal and distal upper extremities and lower extremities bilaterally.   No tremor.  Very mild tremor with FNF testing, R>L hand.  No ataxia.  Upper extremity and lower extremity DTRs symmetric (cannot elicit any DTRs).  No pronator drift but pt felt like he was going to fall backwards when he did this maneuver.   LABS:  Lab Results  Component Value Date   TSH 2.00 02/27/2016   Lab Results  Component Value Date   WBC 4.1 07/22/2016   HGB 14.9 07/22/2016   HCT 45.2 07/22/2016   MCV 92.5 07/22/2016   PLT 100.0 (L) 07/22/2016   Lab Results  Component Value Date   CREATININE 1.24 (H) 02/27/2016   BUN 20 02/27/2016   NA 143 02/27/2016   K 4.6 02/27/2016   CL 109 02/27/2016   CO2 24 02/27/2016   Lab Results   Component Value Date   ALT 22 02/27/2016   AST 24 02/27/2016   ALKPHOS 42 02/27/2016   BILITOT 1.0 02/27/2016   Lab Results  Component Value Date   CHOL 178 02/27/2016   Lab Results  Component Value Date   HDL 47 02/27/2016   Lab Results  Component Value Date   LDLCALC 115 (H) 02/27/2016   Lab Results  Component Value Date   TRIG 81 02/27/2016   Lab Results  Component Value Date   CHOLHDL 3.8 02/27/2016   Lab Results  Component Value Date   HGBA1C 5.0 02/27/2016   IMPRESSION AND PLAN:  1) HTN; The current medical regimen is effective;  continue present plan and medications. Lytes/cr with upcoming fasting labs.  2) HLD: tolerating statin.  Return for fasting lipid panel ASAP.  3) Chronic visuaal distortion (metamorphopsia)--waxing and waning, described as sometimes blurry vision and sometimes double vision.  We reviewed imaging of brain (MRI and MRA w/out contrast in 2016---normal). He says this is getting worse over time, and he is in favor of any further w/u, specifically re-imaging and referral to a new neurologist for 2nd opinion (saw Dr. Felecia Shelling in the past). Along these lines, regarding his ocular findings as stated in HPI, will go ahead and set up repeat carotid ultrasound as well. Before any re-imaging can be done, will wait for results of his kidney function.  4) Restless legs syndrome, uncontrolled: continue ropinirole 1mg  tid.  We cannot go up on the dose any. Add clonazepam 0.5mg  tid prn.  Therapeutic expectations and side effect profile of medication discussed today.  Patient's questions answered.  5) CRI stage II/III: recheck BMET with upcoming fasting labs.  Spent 40 min with pt today, with >50% of this time spent in counseling and care coordination regarding the above problems.  We discussed in depth his past w/u for his visual and disequilibrium problems, discussed potential further plans for  evaluation of this.  We spent time reviewing past labs  regarding his kidney dysfunction and cholesterol and HTN---agreed upon work up details in future.  Answered questions, pt expressed understanding and agreement with plan.  An After Visit Summary was printed and given to the patient.  FOLLOW UP: Return in about 6 months (around 02/18/2017) for annual CPE (fasting)--also needs appt for fasting labs at his earliest convenience..  Signed:  Crissie Sickles, MD           08/19/2016

## 2016-08-20 ENCOUNTER — Other Ambulatory Visit: Payer: Self-pay | Admitting: Family Medicine

## 2016-08-20 ENCOUNTER — Encounter: Payer: Self-pay | Admitting: Family Medicine

## 2016-08-20 ENCOUNTER — Other Ambulatory Visit (INDEPENDENT_AMBULATORY_CARE_PROVIDER_SITE_OTHER): Payer: 59

## 2016-08-20 DIAGNOSIS — R7989 Other specified abnormal findings of blood chemistry: Secondary | ICD-10-CM | POA: Diagnosis not present

## 2016-08-20 DIAGNOSIS — H34231 Retinal artery branch occlusion, right eye: Secondary | ICD-10-CM

## 2016-08-20 DIAGNOSIS — R55 Syncope and collapse: Secondary | ICD-10-CM

## 2016-08-20 DIAGNOSIS — H5319 Other subjective visual disturbances: Secondary | ICD-10-CM

## 2016-08-20 DIAGNOSIS — E878 Other disorders of electrolyte and fluid balance, not elsewhere classified: Secondary | ICD-10-CM

## 2016-08-20 LAB — BASIC METABOLIC PANEL
BUN: 23 mg/dL (ref 6–23)
CO2: 26 mEq/L (ref 19–32)
Calcium: 9.2 mg/dL (ref 8.4–10.5)
Chloride: 106 mEq/L (ref 96–112)
Creatinine, Ser: 1.1 mg/dL (ref 0.40–1.50)
GFR: 69.87 mL/min (ref >60.00)
Glucose, Bld: 107 mg/dL — ABNORMAL HIGH (ref 70–99)
Potassium: 4 mEq/L (ref 3.5–5.1)
Sodium: 140 mEq/L (ref 135–145)

## 2016-08-20 LAB — LIPID PANEL
Cholesterol: 155 mg/dL (ref 0–200)
HDL: 34.8 mg/dL — ABNORMAL LOW (ref >39.00)
NonHDL: 120.41
Total CHOL/HDL Ratio: 4
Triglycerides: 207 mg/dL — ABNORMAL HIGH (ref 0.0–149.0)
VLDL: 41.4 mg/dL — ABNORMAL HIGH (ref 0.0–40.0)

## 2016-08-20 LAB — LDL CHOLESTEROL, DIRECT: Direct LDL: 98 mg/dL

## 2016-08-25 ENCOUNTER — Telehealth: Payer: Self-pay | Admitting: *Deleted

## 2016-08-25 NOTE — Telephone Encounter (Signed)
Open in error

## 2016-08-26 ENCOUNTER — Telehealth: Payer: Self-pay | Admitting: *Deleted

## 2016-08-26 ENCOUNTER — Encounter: Payer: Self-pay | Admitting: Family Medicine

## 2016-08-26 NOTE — Telephone Encounter (Signed)
Received fax from [facility] 90 day supply for omeprazole. Per pts last ov note pt reported he is not taking this medication. Left message for pt to call back to confirm.

## 2016-08-28 ENCOUNTER — Encounter: Payer: Self-pay | Admitting: *Deleted

## 2016-08-28 ENCOUNTER — Other Ambulatory Visit: Payer: Self-pay | Admitting: Family Medicine

## 2016-08-28 DIAGNOSIS — E785 Hyperlipidemia, unspecified: Secondary | ICD-10-CM

## 2016-08-28 MED ORDER — ROSUVASTATIN CALCIUM 10 MG PO TABS: 10.0000 mg | ORAL_TABLET | Freq: Every day | ORAL | 3 refills | Status: DC

## 2016-09-09 ENCOUNTER — Encounter: Payer: Self-pay | Admitting: Family Medicine

## 2016-09-09 ENCOUNTER — Telehealth: Payer: Self-pay | Admitting: Family Medicine

## 2016-09-09 DIAGNOSIS — H5319 Other subjective visual disturbances: Secondary | ICD-10-CM

## 2016-09-09 DIAGNOSIS — E878 Other disorders of electrolyte and fluid balance, not elsewhere classified: Secondary | ICD-10-CM

## 2016-09-09 NOTE — Telephone Encounter (Signed)
Left message for pt to call back  °

## 2016-09-09 NOTE — Telephone Encounter (Signed)
Pt advised and voiced understanding. He stated that he doesn't have a specific neurologist just who ever Dr. Anitra Lauth recommends.

## 2016-09-09 NOTE — Telephone Encounter (Signed)
Pls notify pt that his ultrasound of neck arteries was normal AND his brain MRI was normal.   Our next step planned was referral to a new neurologist.  Ask if he has a specific neurologist in mind or does he want me to choose?  -thx

## 2016-09-09 NOTE — Telephone Encounter (Signed)
Referral to [facility] neurology ordered.

## 2016-09-10 NOTE — Telephone Encounter (Signed)
Pt is aware someone will contact him to set up an apt.

## 2016-09-24 ENCOUNTER — Encounter: Payer: Self-pay | Admitting: Family Medicine

## 2016-09-24 ENCOUNTER — Ambulatory Visit (INDEPENDENT_AMBULATORY_CARE_PROVIDER_SITE_OTHER): Payer: 59 | Admitting: Family Medicine

## 2016-09-24 VITALS — BP 102/62 | HR 66 | Temp 97.7°F | Resp 16 | Ht 76.0 in | Wt 275.5 lb

## 2016-09-24 DIAGNOSIS — F988 Other specified behavioral and emotional disorders with onset usually occurring in childhood and adolescence: Secondary | ICD-10-CM

## 2016-09-24 DIAGNOSIS — G2581 Restless legs syndrome: Secondary | ICD-10-CM | POA: Diagnosis not present

## 2016-09-24 DIAGNOSIS — E878 Other disorders of electrolyte and fluid balance, not elsewhere classified: Secondary | ICD-10-CM

## 2016-09-24 MED ORDER — AMPHETAMINE-DEXTROAMPHET ER 30 MG PO CP24: ORAL_CAPSULE | ORAL | 0 refills | Status: DC

## 2016-09-24 NOTE — Progress Notes (Signed)
OFFICE VISIT  09/24/2016   CC:  Chief Complaint  Patient presents with  . Follow-up    ADHD   HPI:    Patient is a 71 y.o. Caucasian male who presents for "discuss ADD medication". Says he was dx'd with ADHD around age 22, says he dx'd at specialty clinic. Says he was on adderall but it was stopped by his MD b/c of potential for increase in his bp/problems controlling HTN.  Endorses impatience, lack of concentration and focus, easily frustrated with this, can't finish difficult/complicated tasks, feels like "driven by motor" inside.  This affects his home, work, and social life adversely. Adderall XR 30 mg, 2 tabs qAM helped significantly in the past.  No side effects were noted except appetite suppression. He did not lose weight on the med.  Of note, he states the clonazepam I rx'd him last visit for his uncontrolled RLS has actually helped his chronic disequilibrium problem-- has made him feel 90% improved! RLS much improved as well.   Past Medical History:  Diagnosis Date  . Adult ADHD    adderall caused elevated bp  . Anxiety and depression   . Arthritis    C-spine  . At risk for sleep apnea    STOP-BANG= 5      SENT TO PCP 11-05-2014  . Branch retinal artery occlusion of right eye   . Chronic pain   . Disequilibrium syndrome    Carotid dopplers and MRI brain NORMAL x 2 (including eighth nerve studies on most recent MRI 08/2016).  . Erectile dysfunction   . Heart murmur   . History of concussion    june 2015--  hit head w/ syncope---  no residual  . History of memory loss    per prior PCP records.  . History of syncope    june 2015 --syncope w/ fall and pt states has happened 5 more times , the last one being jan 2016,,  states had battery of test and unknown cause  . Hyperlipidemia 02/2016   atorva 02/2016---lipid #s worse so changed to generic crestor.  . Hypertension   . IFG (impaired fasting glucose) 02/2016   HbA1c 5 %.  . Insomnia   . Lower urinary tract  symptoms (LUTS)   . Mild aortic stenosis    per echo valve area 1.78cm^2  . Myalgia and myositis, unspecified   . Prostate cancer [facility]) urologist-  dr ottelin/  oncologist-  dr Valere Dross   2016 Stage T1c,  Gleason 4+3,  PSA 5.26, vol 49.19cc  . Restless leg   . Thrombocytopenia (HCC)    >100K.  Marland Kitchen Upper airway cough syndrome 2018   Dr. Halford Chessman.  Cough therapy maximized at f/u 07/2016; referred to ENT by Pulm.  Allergy testing OK. Improved as of 08/05/16 pulm f/u.  Marland Kitchen Vertebrobasilar insufficiency    per neurologist note dr Felecia Shelling-- this is possible causing syncope-- no tx just be careful getting up from sitting position  . Wears glasses     Past Surgical History:  Procedure Laterality Date  . Carotid Dopplers  08/01/2014   NORMAL (Dr. Felecia Shelling)  . CERVICAL FUSION  x1  last one 2010  . COLONOSCOPY  02/2004  . EYE SURGERY Bilateral 2013   5 right eye, 3 left eye surgeries (including cataract extraction's iol w/ lens implant, other surgery's for post-op lens fungal infection and floaters)  . GOLD SEED IMPLANT N/A 02/22/2015   Procedure: GOLD SEED IMPLANT;  Surgeon: Kathie Rhodes, MD;  Location: [facility];  Service: Urology;  Laterality: N/A;  . LEFT THIGH QUADRICEP MUSCLE BIOPSY'S  12-31-2005  . LUMBAR FUSION  x2  last one 2010  . MRA/MRI w/out contrast--brain  12/2014   mild, age-related chronic microvascular ischemic change (Dr. Felecia Shelling).  Marland Kitchen RADIOACTIVE SEED IMPLANT N/A 05/17/2015   Procedure: RADIOACTIVE SEED IMPLANT/BRACHYTHERAPY IMPLANT;  Surgeon: Kathie Rhodes, MD;  Location: [place];  Service: Urology;  Laterality: N/A;  . TRANSRECTAL ULTRASOUND N/A 11/12/2014   Procedure: TRANSRECTAL ULTRASOUND AND BIOPSY  ;  Surgeon: Kathie Rhodes, MD;  Location: [facility];  Service: Urology;  Laterality: N/A;  . TRANSTHORACIC ECHOCARDIOGRAM  03-29-2014   mild concentric LVH/  ef 67-89%/  grade I diastolic dysfunction/  AV poorly visualized, possibly bicupid;   mild AS,  valve area 1.78cm^2,  mean grandient 19mm Hg/  trivial TR    Outpatient Medications Prior to Visit  Medication Sig Dispense Refill  . aspirin EC 81 MG tablet Take 81 mg by mouth daily.    Marland Kitchen b complex vitamins capsule Take 1 capsule by mouth daily.    Marland Kitchen CIALIS 5 MG tablet Take 1 tablet by mouth daily.    . clonazePAM (KLONOPIN) 0.5 MG tablet 1 tab po tid for restless legs syndrome 90 tablet 5  . meclizine (ANTIVERT) 25 MG tablet Take 1 tablet (25 mg total) by mouth 3 (three) times daily as needed for dizziness. 90 tablet 6  . metoprolol tartrate (LOPRESSOR) 25 MG tablet TAKE 1 (ONE) TABLET BY MOUTH TWO TIMES DAILY  1  . Multiple Vitamin (MULTIVITAMIN) tablet Take 1 tablet by mouth daily.    Marland Kitchen rOPINIRole (REQUIP) 1 MG tablet Take 1 tablet (1 mg total) by mouth 3 (three) times daily. 270 tablet 3  . rosuvastatin (CRESTOR) 10 MG tablet Take 1 tablet (10 mg total) by mouth daily. 30 tablet 3  . valsartan (DIOVAN) 160 MG tablet Take 160 mg by mouth every morning.    Marland Kitchen albuterol (VENTOLIN HFA) 108 (90 Base) MCG/ACT inhaler Inhale 1-2 puffs into the lungs every 6 (six) hours as needed for wheezing or shortness of breath. (Patient not taking: Reported on 09/24/2016) 1 Inhaler 0  . fluticasone (CUTIVATE) 0.05 % cream Apply topically 2 (two) times daily. (Patient not taking: Reported on 09/24/2016) 30 g 3  . Turmeric 500 MG TABS Take 1 tablet by mouth daily.     No facility-administered medications prior to visit.     Allergies  Allergen Reactions  . Hydrocodone Nausea Only  . Penicillins Other (See Comments)    Unknown childhood reaction    ROS As per HPI  PE: Blood pressure 102/62, pulse 66, temperature 97.7 F (36.5 C), temperature source Oral, resp. rate 16, height 6\' 4"  (1.93 m), weight 275 lb 8 oz (125 kg), SpO2 94 %. Wt Readings from Last 2 Encounters:  09/24/16 275 lb 8 oz (125 kg)  08/19/16 267 lb 4 oz (121.2 kg)    Gen: alert, oriented x 4, affect pleasant.  Lucid  thinking and conversation noted. HEENT: PERRLA, EOMI.   Neck: no LAD, mass, or thyromegaly. CV: RRR, no m/r/g LUNGS: CTA bilat, nonlabored. NEURO: no tremor or tics noted on observation.  Coordination intact. CN 2-12 grossly intact bilaterally, strength 5/5 in all extremeties.  No ataxia.   LABS:  none  IMPRESSION AND PLAN:  1) Adult ADD: he has done well on adderall xr 60mg  qAM in the past and had no adverse effects. Restart adderall XR at  this dosing, monitor bp at home and call if persistently > 140/90. I printed rx's for adderall xR 30mg , 2 caps po qAM, #60 today for this month, Aug 2018, and Sept 2018.  Appropriate fill on/after date was noted on each rx. Therapeutic expectations and side effect profile of medication discussed today.  Patient's questions answered.  2) Disequilibrium syndrome + RLS: clonazepam 0.5mg  tid added last month has helped BOTH of these conditions.  An After Visit Summary was printed and given to the patient.  FOLLOW UP: Return in about 3 months (around 12/25/2016) for f/u ADD.  Signed:  Crissie Sickles, MD           09/24/2016

## 2016-10-28 ENCOUNTER — Other Ambulatory Visit: Payer: Self-pay | Admitting: *Deleted

## 2016-10-28 MED ORDER — ROSUVASTATIN CALCIUM 10 MG PO TABS: 10.0000 mg | ORAL_TABLET | Freq: Every day | ORAL | 1 refills | Status: DC

## 2016-10-28 NOTE — Telephone Encounter (Signed)
[facility].  RF request for rosuvastatin LOV: 08/19/16 Next ov: 02/16/17 Last written: 08/28/16 #30 w/ 3RF  Pharmacy requesting 90 day supply due to pts insurance.

## 2016-11-17 ENCOUNTER — Other Ambulatory Visit (INDEPENDENT_AMBULATORY_CARE_PROVIDER_SITE_OTHER): Payer: 59

## 2016-11-17 DIAGNOSIS — I10 Essential (primary) hypertension: Secondary | ICD-10-CM

## 2016-11-17 DIAGNOSIS — E7800 Pure hypercholesterolemia, unspecified: Secondary | ICD-10-CM | POA: Diagnosis not present

## 2016-11-17 DIAGNOSIS — E785 Hyperlipidemia, unspecified: Secondary | ICD-10-CM

## 2016-11-18 ENCOUNTER — Encounter: Payer: Self-pay | Admitting: Family Medicine

## 2016-11-18 LAB — LIPID PANEL
Cholesterol: 116 mg/dL (ref <200)
HDL: 41 mg/dL (ref >40)
LDL Cholesterol: 55 mg/dL (ref <100)
Total CHOL/HDL Ratio: 2.8 Ratio (ref <5.0)
Triglycerides: 102 mg/dL (ref <150)
VLDL: 20 mg/dL (ref <30)

## 2016-11-18 LAB — BASIC METABOLIC PANEL
BUN: 25 mg/dL (ref 7–25)
CO2: 26 mmol/L (ref 20–32)
Calcium: 8.7 mg/dL (ref 8.6–10.3)
Chloride: 107 mmol/L (ref 98–110)
Creat: 0.88 mg/dL (ref 0.70–1.18)
Glucose, Bld: 106 mg/dL — ABNORMAL HIGH (ref 65–99)
Potassium: 4.3 mmol/L (ref 3.5–5.3)
Sodium: 142 mmol/L (ref 135–146)

## 2016-11-25 ENCOUNTER — Encounter: Payer: Self-pay | Admitting: Family Medicine

## 2016-11-25 ENCOUNTER — Ambulatory Visit (INDEPENDENT_AMBULATORY_CARE_PROVIDER_SITE_OTHER): Payer: 59 | Admitting: Family Medicine

## 2016-11-25 VITALS — BP 152/74 | HR 77 | Temp 97.9°F | Resp 16 | Ht 76.0 in | Wt 265.5 lb

## 2016-11-25 DIAGNOSIS — R0989 Other specified symptoms and signs involving the circulatory and respiratory systems: Secondary | ICD-10-CM | POA: Diagnosis not present

## 2016-11-25 DIAGNOSIS — I952 Hypotension due to drugs: Secondary | ICD-10-CM | POA: Diagnosis not present

## 2016-11-25 MED ORDER — CLONIDINE HCL 0.1 MG PO TABS: ORAL_TABLET | ORAL | 0 refills | Status: DC

## 2016-11-25 NOTE — Patient Instructions (Signed)
Check blood pressure and heart rate daily and write these numbers down to review with me at next visit in 1 mo.  If bp is >170 on top or > 100 on bottom, take clonidine as prescribed.

## 2016-11-25 NOTE — Progress Notes (Signed)
OFFICE VISIT  11/25/2016   CC:  Chief Complaint  Patient presents with  . Low BP readings   HPI:    Patient is a 71 y.o. Caucasian male who presents for recent low bp reading (11/16/16). He has hx of HTN, and mild AS by echo in 2016.  Reports bp dropped to as low as 89/44.  Onset 11/16/16--lasted several days, all day, felt "like a washed out dishrag".  He did not check HR during this time.  Takes bp med in mornings. Then it returned to normal and he says he started feeling great.  He stopped his high bp med when bp showed up low.   No hx of dehydration prior to low bp's.  No otc meds prior.   Denies any associated SOB, diaphoresis, CP, or palpitations.  He did feel orthostatic wooziness on a couple of occasions.  No syncope or fall.  Has never had bp drop like this before.  Pt says he is not convinced he needs to be on high bp med. Says about 6 yrs ago his bp "spiked" and then was normal for a year w/out med. This has happened a few times over the last few years.  On most recent occasion his prior PMD started him on bp med.  When it "spikes" it has gone up to 240/120.   Past Medical History:  Diagnosis Date  . Adult ADHD    adderall caused elevated bp  . Anxiety and depression   . Arthritis    C-spine  . At risk for sleep apnea    STOP-BANG= 5      SENT TO PCP 11-05-2014  . Branch retinal artery occlusion of right eye   . Chronic pain   . Disequilibrium syndrome    Carotid dopplers and MRI brain NORMAL x 2 (including eighth nerve studies on most recent MRI 08/2016).  . Erectile dysfunction   . Heart murmur   . History of concussion    june 2015--  hit head w/ syncope---  no residual  . History of memory loss    per prior PCP records.  . History of syncope    june 2015 --syncope w/ fall and pt states has happened 5 more times , the last one being jan 2016,,  states had battery of test and unknown cause  . Hyperlipidemia 02/2016   atorva 02/2016---lipid #s worse so changed  to generic crestor.  Much improved on crestor.  . Hypertension   . IFG (impaired fasting glucose) 02/2016   HbA1c 5 %.  . Insomnia   . Lower urinary tract symptoms (LUTS)   . Mild aortic stenosis    per echo valve area 1.78cm^2  . Myalgia and myositis, unspecified   . Prostate cancer [facility]) urologist-  dr ottelin/  oncologist-  dr Valere Dross   2016 Stage T1c,  Gleason 4+3,  PSA 5.26, vol 49.19cc  . Restless leg   . Thrombocytopenia (HCC)    >100K.  Marland Kitchen Upper airway cough syndrome 2018   Dr. Halford Chessman.  Cough therapy maximized at f/u 07/2016; referred to ENT by Pulm.  Allergy testing OK. Improved as of 08/05/16 pulm f/u.  Marland Kitchen Vertebrobasilar insufficiency    per neurologist note dr Felecia Shelling-- this is possible causing syncope-- no tx just be careful getting up from sitting position  . Wears glasses     Past Surgical History:  Procedure Laterality Date  . Carotid Dopplers  08/01/2014   NORMAL (Dr. Felecia Shelling)  . CERVICAL FUSION  x1  last one 2010  . COLONOSCOPY  02/2004  . EYE SURGERY Bilateral 2013   5 right eye, 3 left eye surgeries (including cataract extraction's iol w/ lens implant, other surgery's for post-op lens fungal infection and floaters)  . GOLD SEED IMPLANT N/A 02/22/2015   Procedure: GOLD SEED IMPLANT;  Surgeon: Kathie Rhodes, MD;  Location: [facility];  Service: Urology;  Laterality: N/A;  . LEFT THIGH QUADRICEP MUSCLE BIOPSY'S  12-31-2005  . LUMBAR FUSION  x2  last one 2010  . MRA/MRI w/out contrast--brain  12/2014   mild, age-related chronic microvascular ischemic change (Dr. Felecia Shelling).  Marland Kitchen RADIOACTIVE SEED IMPLANT N/A 05/17/2015   Procedure: RADIOACTIVE SEED IMPLANT/BRACHYTHERAPY IMPLANT;  Surgeon: Kathie Rhodes, MD;  Location: [place];  Service: Urology;  Laterality: N/A;  . TRANSRECTAL ULTRASOUND N/A 11/12/2014   Procedure: TRANSRECTAL ULTRASOUND AND BIOPSY  ;  Surgeon: Kathie Rhodes, MD;  Location: [facility];  Service: Urology;  Laterality:  N/A;  . TRANSTHORACIC ECHOCARDIOGRAM  03-29-2014   mild concentric LVH/  ef 62-83%/  grade I diastolic dysfunction/  AV poorly visualized, possibly bicupid;  mild AS,  valve area 1.78cm^2,  mean grandient 6mm Hg/  trivial TR    Outpatient Medications Prior to Visit  Medication Sig Dispense Refill  . amphetamine-dextroamphetamine (ADDERALL XR) 30 MG 24 hr capsule 2 caps po qAM 60 capsule 0  . aspirin EC 81 MG tablet Take 81 mg by mouth daily.    Marland Kitchen b complex vitamins capsule Take 1 capsule by mouth daily.    Marland Kitchen CIALIS 5 MG tablet Take 1 tablet by mouth daily.    . clonazePAM (KLONOPIN) 0.5 MG tablet 1 tab po tid for restless legs syndrome 90 tablet 5  . meclizine (ANTIVERT) 25 MG tablet Take 1 tablet (25 mg total) by mouth 3 (three) times daily as needed for dizziness. 90 tablet 6  . Multiple Vitamin (MULTIVITAMIN) tablet Take 1 tablet by mouth daily.    Marland Kitchen rOPINIRole (REQUIP) 1 MG tablet Take 1 tablet (1 mg total) by mouth 3 (three) times daily. 270 tablet 3  . rosuvastatin (CRESTOR) 10 MG tablet Take 1 tablet (10 mg total) by mouth daily. 90 tablet 1  . metoprolol tartrate (LOPRESSOR) 25 MG tablet TAKE 1 (ONE) TABLET BY MOUTH TWO TIMES DAILY  1  . valsartan (DIOVAN) 160 MG tablet Take 160 mg by mouth every morning.     No facility-administered medications prior to visit.     Allergies  Allergen Reactions  . Hydrocodone Nausea Only  . Penicillins Other (See Comments)    Unknown childhood reaction    ROS As per HPI  PE: Blood pressure (!) 152/74, pulse 77, temperature 97.9 F (36.6 C), temperature source Oral, resp. rate 16, height 6\' 4"  (1.93 m), weight 265 lb 8 oz (120.4 kg), SpO2 95 %.  Repeat bp today manually at end of visit was 130/70. Gen: Alert, well appearing.  Patient is oriented to person, place, time, and situation. AFFECT: pleasant, lucid thought and speech. CV: RRR, trace to 1/6 systolic murmur, no diastolic murmur.  No rub or gallop. Chest is clear, no wheezing or  rales. Normal symmetric air entry throughout both lung fields. No chest wall deformities or tenderness.   LABS:    Chemistry      Component Value Date/Time   NA 142 11/17/2016 1033   K 4.3 11/17/2016 1033   CL 107 11/17/2016 1033   CO2 26 11/17/2016 1033   BUN  25 11/17/2016 1033   CREATININE 0.88 11/17/2016 1033      Component Value Date/Time   CALCIUM 8.7 11/17/2016 1033   ALKPHOS 42 02/27/2016 0851   AST 24 02/27/2016 0851   ALT 22 02/27/2016 0851   BILITOT 1.0 02/27/2016 0851     Lab Results  Component Value Date   CHOL 116 11/17/2016   HDL 41 11/17/2016   LDLCALC 55 11/17/2016   LDLDIRECT 98.0 08/20/2016   TRIG 102 11/17/2016   CHOLHDL 2.8 11/17/2016   12 lead EKG today: NSR, rate 69, normal intervals and duration, no ectopy, no ischemic changes or hypertrophy.  No change from 05/2016 EKG except on current EKG the sinus BRADYCARDIA is gone.  IMPRESSION AND PLAN:  1) Low blood pressure; with fatigue associated but no other sx's.  Resolved with cessation of bp meds. Question of labile HTN in the past---with ? Normal bp in between his extreme highs (even before he was on bp med).   Will continue him off his antihypertensives (lopressor and valsartan). Instructions: Check blood pressure and heart rate daily and write these numbers down to review with me at next visit in 1 mo. If bp is >170 on top or > 100 on bottom, take clonidine as I prescribed today.  An After Visit Summary was printed and given to the patient.  FOLLOW UP: Return in about 4 weeks (around 12/23/2016) for f/u bp.  Signed:  Crissie Sickles, MD           11/25/2016

## 2016-12-02 ENCOUNTER — Ambulatory Visit: Payer: 59 | Admitting: Pulmonary Disease

## 2017-01-31 ENCOUNTER — Other Ambulatory Visit: Payer: Self-pay | Admitting: Family Medicine

## 2017-02-01 ENCOUNTER — Other Ambulatory Visit: Payer: Self-pay | Admitting: Family Medicine

## 2017-02-01 MED ORDER — AMPHETAMINE-DEXTROAMPHET ER 30 MG PO CP24
ORAL_CAPSULE | ORAL | 0 refills | Status: DC
Start: 2017-02-01 — End: 2017-02-26

## 2017-02-01 NOTE — Telephone Encounter (Signed)
RF request for adderall LOV: 09/24/16 Next ov: 02/26/17 Last written: 09/24/16 #60 w/ 0RF  Please advise. Thanks.

## 2017-02-01 NOTE — Telephone Encounter (Signed)
Patient requesting refill of amphetamine-dextroamphetamine (ADDERALL XR) 30 MG 24 hr capsule.  Pt requesting to pick up script today if possible.

## 2017-02-01 NOTE — Telephone Encounter (Signed)
Rx put up front for pt to p/u. Pt advised and voiced understanding.  

## 2017-02-16 ENCOUNTER — Ambulatory Visit: Payer: 59 | Admitting: Family Medicine

## 2017-02-16 ENCOUNTER — Encounter: Payer: Self-pay | Admitting: Family Medicine

## 2017-02-16 ENCOUNTER — Other Ambulatory Visit: Payer: Self-pay

## 2017-02-16 VITALS — BP 139/83 | HR 70 | Temp 97.4°F | Resp 16 | Ht 76.0 in | Wt 262.5 lb

## 2017-02-16 DIAGNOSIS — R21 Rash and other nonspecific skin eruption: Secondary | ICD-10-CM

## 2017-02-16 DIAGNOSIS — M25561 Pain in right knee: Secondary | ICD-10-CM

## 2017-02-16 DIAGNOSIS — M8949 Other hypertrophic osteoarthropathy, multiple sites: Secondary | ICD-10-CM

## 2017-02-16 DIAGNOSIS — M150 Primary generalized (osteo)arthritis: Secondary | ICD-10-CM | POA: Diagnosis not present

## 2017-02-16 DIAGNOSIS — G8929 Other chronic pain: Secondary | ICD-10-CM | POA: Diagnosis not present

## 2017-02-16 DIAGNOSIS — M25562 Pain in left knee: Secondary | ICD-10-CM | POA: Diagnosis not present

## 2017-02-16 DIAGNOSIS — M159 Polyosteoarthritis, unspecified: Secondary | ICD-10-CM

## 2017-02-16 NOTE — Patient Instructions (Signed)
Increase your ibuprofen/advil to THREE of the OTC tabs TWICE per day to help with chronic knee pain. Take this med with a little bit of food in the stomach.

## 2017-02-16 NOTE — Progress Notes (Signed)
OFFICE VISIT  02/16/2017   CC:  Chief Complaint  Patient presents with  . Knee Pain    both but left knee is worse   HPI:    Patient is a 71 y.o. Caucasian male who presents for knee pain, bilat. L worse than R occurring for years, worse the last 6 mo.  Hurts constantly, mild--the gets severely worse with wt bearing.  Causes limp/gait abnormality. Has been putting off MD visit for eval of this.  Takes 2 advil per day for generalized arthritis pain. Occ mild swelling noted.  No redness noted.  Not hot to touch.  No knee imaging is in EMR.  No hx of knee surgery.  Also, noted onset of rash on upper chest about 2 mo ago, started when he was on a cruise in [place].  No change in level of sun exposure, no sun screens prior.  The rash does not itch.  It started on upper chest region, and he feels like it may be spread some on his shoulders, doesn't know about back.  No burning or other type of pain in the area of rash. He tried topical steroid I had rx'd in the past for dermatitis but it did not help and he stopped it after only 2 applications. Never had this kind of rash in the past.  Past Medical History:  Diagnosis Date  . Adult ADHD    adderall caused elevated bp  . Anxiety and depression   . Arthritis    C-spine  . At risk for sleep apnea    STOP-BANG= 5      SENT TO PCP 11-05-2014  . Branch retinal artery occlusion of right eye   . Chronic pain   . Disequilibrium syndrome    Carotid dopplers and MRI brain NORMAL x 2 (including eighth nerve studies on most recent MRI 08/2016).  . Erectile dysfunction   . Heart murmur   . History of concussion    june 2015--  hit head w/ syncope---  no residual  . History of memory loss    per prior PCP records.  . History of syncope    june 2015 --syncope w/ fall and pt states has happened 5 more times , the last one being jan 2016,,  states had battery of test and unknown cause  . Hyperlipidemia 02/2016   atorva 02/2016---lipid #s  worse so changed to generic crestor.  Much improved on crestor.  . Hypertension   . IFG (impaired fasting glucose) 02/2016   HbA1c 5 %.  . Insomnia   . Lower urinary tract symptoms (LUTS)   . Mild aortic stenosis    per echo valve area 1.78cm^2  . Myalgia and myositis, unspecified   . Prostate cancer [facility]) urologist-  dr ottelin/  oncologist-  dr Valere Dross   2016 Stage T1c,  Gleason 4+3,  PSA 5.26, vol 49.19cc  . Restless leg   . Thrombocytopenia (HCC)    >100K.  Marland Kitchen Upper airway cough syndrome 2018   Dr. Halford Chessman.  Cough therapy maximized at f/u 07/2016; referred to ENT by Pulm.  Allergy testing OK. Improved as of 08/05/16 pulm f/u.  Marland Kitchen Vertebrobasilar insufficiency    per neurologist note dr Felecia Shelling-- this is possible causing syncope-- no tx just be careful getting up from sitting position  . Wears glasses     Past Surgical History:  Procedure Laterality Date  . Carotid Dopplers  08/01/2014   NORMAL (Dr. Felecia Shelling)  . CERVICAL FUSION  x1  last one 2010  . COLONOSCOPY  02/2004  . EYE SURGERY Bilateral 2013   5 right eye, 3 left eye surgeries (including cataract extraction's iol w/ lens implant, other surgery's for post-op lens fungal infection and floaters)  . GOLD SEED IMPLANT N/A 02/22/2015   Procedure: GOLD SEED IMPLANT;  Surgeon: Kathie Rhodes, MD;  Location: [facility];  Service: Urology;  Laterality: N/A;  . LEFT THIGH QUADRICEP MUSCLE BIOPSY'S  12-31-2005  . LUMBAR FUSION  x2  last one 2010  . MRA/MRI w/out contrast--brain  12/2014   mild, age-related chronic microvascular ischemic change (Dr. Felecia Shelling).  Marland Kitchen RADIOACTIVE SEED IMPLANT N/A 05/17/2015   Procedure: RADIOACTIVE SEED IMPLANT/BRACHYTHERAPY IMPLANT;  Surgeon: Kathie Rhodes, MD;  Location: [place];  Service: Urology;  Laterality: N/A;  . TRANSRECTAL ULTRASOUND N/A 11/12/2014   Procedure: TRANSRECTAL ULTRASOUND AND BIOPSY  ;  Surgeon: Kathie Rhodes, MD;  Location: [facility];  Service:  Urology;  Laterality: N/A;  . TRANSTHORACIC ECHOCARDIOGRAM  03-29-2014   mild concentric LVH/  ef 35-32%/  grade I diastolic dysfunction/  AV poorly visualized, possibly bicupid;  mild AS,  valve area 1.78cm^2,  mean grandient 90mm Hg/  trivial TR    Outpatient Medications Prior to Visit  Medication Sig Dispense Refill  . amphetamine-dextroamphetamine (ADDERALL XR) 30 MG 24 hr capsule 2 caps po qAM 60 capsule 0  . aspirin EC 81 MG tablet Take 81 mg by mouth daily.    Marland Kitchen b complex vitamins capsule Take 1 capsule by mouth daily.    Marland Kitchen CIALIS 5 MG tablet Take 1 tablet by mouth daily.    . clonazePAM (KLONOPIN) 0.5 MG tablet 1 tab po tid for restless legs syndrome 90 tablet 5  . cloNIDine (CATAPRES) 0.1 MG tablet TAKE 1 TAB BY MOUTH TWICE A DAY AS NEEDED BLOOD PRESSURE >170 ON TOP OR >100 ON BOTTOM 60 tablet 0  . meclizine (ANTIVERT) 25 MG tablet Take 1 tablet (25 mg total) by mouth 3 (three) times daily as needed for dizziness. 90 tablet 6  . Multiple Vitamin (MULTIVITAMIN) tablet Take 1 tablet by mouth daily.    Marland Kitchen rOPINIRole (REQUIP) 1 MG tablet Take 1 tablet (1 mg total) by mouth 3 (three) times daily. 270 tablet 3  . rosuvastatin (CRESTOR) 10 MG tablet Take 1 tablet (10 mg total) by mouth daily. 90 tablet 1   No facility-administered medications prior to visit.     Allergies  Allergen Reactions  . Hydrocodone Nausea Only  . Penicillins Other (See Comments)    Unknown childhood reaction    ROS As per HPI  PE: Blood pressure 139/83, pulse 70, temperature (!) 97.4 F (36.3 C), temperature source Oral, resp. rate 16, height 6\' 4"  (1.93 m), weight 262 lb 8 oz (119.1 kg), SpO2 94 %. Gen: Alert, well appearing.  Patient is oriented to person, place, time, and situation. AFFECT: pleasant, lucid thought and speech. Knees: no erythema, swelling, warmth, or effusion noted on inspection/palpation. No tenderness to palpation anywhere on or about the knee on either side.  Pain starts in general  knee region bilat when the knee is extended to 170 deg passively.  Full flexion does not elicit any pain.  McMurray's neg bilat. Lachman's neg bilat.  Post drawer neg bilat.  No pain or laxity with varus/valgus stress on either knee. SKIN: small pinkish plaques of various sizes (2 mm to 1 cm), oblong/oval shaped, mostly on upper chest and shoulders, but some lesions can  be seen distributed diffusely across back, ? In -Xmas tree pattern?  LABS:    Chemistry      Component Value Date/Time   NA 142 11/17/2016 1033   K 4.3 11/17/2016 1033   CL 107 11/17/2016 1033   CO2 26 11/17/2016 1033   BUN 25 11/17/2016 1033   CREATININE 0.88 11/17/2016 1033      Component Value Date/Time   CALCIUM 8.7 11/17/2016 1033   ALKPHOS 42 02/27/2016 0851   AST 24 02/27/2016 0851   ALT 22 02/27/2016 0851   BILITOT 1.0 02/27/2016 0851       IMPRESSION AND PLAN:  1) Chronic bilateral knee pain, suspect osteoarthritis. Will check x-rays of both knees. Increase ibuprofen to 600 mg twice a day with food. If mild/mod DJD, then I'll have him come back for L knee steroid injection.  If effective and he desires an injection in R knee then we'll do this eventually. If severe DJD, would recommend we go ahead and NOT inject at this time, and refer him to orthopedist.  2) RAsh x 2 mo: suspect atypical presentation of pityriasis rosea. Observe for another 4-6 weeks w/out any treatment.  If no resolution by that time, will refer to dermatologist.  An After Visit Summary was printed and given to the patient.  FOLLOW UP: Return for f/u to be determined based on results of x-rays.  Signed:  Crissie Sickles, MD           02/16/2017

## 2017-02-16 NOTE — Progress Notes (Deleted)
OFFICE VISIT  02/16/2017   CC: No chief complaint on file.    HPI:    Patient is a 71 y.o. Caucasian male who presents for knee pain.  I see no imaging of knees in EMR.  He has no hx of knee surgery.  Past Medical History:  Diagnosis Date  . Adult ADHD    adderall caused elevated bp  . Anxiety and depression   . Arthritis    C-spine  . At risk for sleep apnea    STOP-BANG= 5      SENT TO PCP 11-05-2014  . Branch retinal artery occlusion of right eye   . Chronic pain   . Disequilibrium syndrome    Carotid dopplers and MRI brain NORMAL x 2 (including eighth nerve studies on most recent MRI 08/2016).  . Erectile dysfunction   . Heart murmur   . History of concussion    june 2015--  hit head w/ syncope---  no residual  . History of memory loss    per prior PCP records.  . History of syncope    june 2015 --syncope w/ fall and pt states has happened 5 more times , the last one being jan 2016,,  states had battery of test and unknown cause  . Hyperlipidemia 02/2016   atorva 02/2016---lipid #s worse so changed to generic crestor.  Much improved on crestor.  . Hypertension   . IFG (impaired fasting glucose) 02/2016   HbA1c 5 %.  . Insomnia   . Lower urinary tract symptoms (LUTS)   . Mild aortic stenosis    per echo valve area 1.78cm^2  . Myalgia and myositis, unspecified   . Prostate cancer [facility]) urologist-  dr ottelin/  oncologist-  dr Valere Dross   2016 Stage T1c,  Gleason 4+3,  PSA 5.26, vol 49.19cc  . Restless leg   . Thrombocytopenia (HCC)    >100K.  Marland Kitchen Upper airway cough syndrome 2018   Dr. Halford Chessman.  Cough therapy maximized at f/u 07/2016; referred to ENT by Pulm.  Allergy testing OK. Improved as of 08/05/16 pulm f/u.  Marland Kitchen Vertebrobasilar insufficiency    per neurologist note dr Felecia Shelling-- this is possible causing syncope-- no tx just be careful getting up from sitting position  . Wears glasses     Past Surgical History:  Procedure Laterality Date  . Carotid Dopplers   08/01/2014   NORMAL (Dr. Felecia Shelling)  . CERVICAL FUSION  x1  last one 2010  . COLONOSCOPY  02/2004  . EYE SURGERY Bilateral 2013   5 right eye, 3 left eye surgeries (including cataract extraction's iol w/ lens implant, other surgery's for post-op lens fungal infection and floaters)  . GOLD SEED IMPLANT N/A 02/22/2015   Procedure: GOLD SEED IMPLANT;  Surgeon: Kathie Rhodes, MD;  Location: [facility];  Service: Urology;  Laterality: N/A;  . LEFT THIGH QUADRICEP MUSCLE BIOPSY'S  12-31-2005  . LUMBAR FUSION  x2  last one 2010  . MRA/MRI w/out contrast--brain  12/2014   mild, age-related chronic microvascular ischemic change (Dr. Felecia Shelling).  Marland Kitchen RADIOACTIVE SEED IMPLANT N/A 05/17/2015   Procedure: RADIOACTIVE SEED IMPLANT/BRACHYTHERAPY IMPLANT;  Surgeon: Kathie Rhodes, MD;  Location: [place];  Service: Urology;  Laterality: N/A;  . TRANSRECTAL ULTRASOUND N/A 11/12/2014   Procedure: TRANSRECTAL ULTRASOUND AND BIOPSY  ;  Surgeon: Kathie Rhodes, MD;  Location: [facility];  Service: Urology;  Laterality: N/A;  . TRANSTHORACIC ECHOCARDIOGRAM  03-29-2014   mild concentric LVH/  ef  96-43%/  grade I diastolic dysfunction/  AV poorly visualized, possibly bicupid;  mild AS,  valve area 1.78cm^2,  mean grandient 63mm Hg/  trivial TR    Outpatient Medications Prior to Visit  Medication Sig Dispense Refill  . amphetamine-dextroamphetamine (ADDERALL XR) 30 MG 24 hr capsule 2 caps po qAM 60 capsule 0  . aspirin EC 81 MG tablet Take 81 mg by mouth daily.    Marland Kitchen b complex vitamins capsule Take 1 capsule by mouth daily.    Marland Kitchen CIALIS 5 MG tablet Take 1 tablet by mouth daily.    . clonazePAM (KLONOPIN) 0.5 MG tablet 1 tab po tid for restless legs syndrome 90 tablet 5  . cloNIDine (CATAPRES) 0.1 MG tablet TAKE 1 TAB BY MOUTH TWICE A DAY AS NEEDED BLOOD PRESSURE >170 ON TOP OR >100 ON BOTTOM 60 tablet 0  . meclizine (ANTIVERT) 25 MG tablet Take 1 tablet (25 mg total) by mouth 3  (three) times daily as needed for dizziness. 90 tablet 6  . Multiple Vitamin (MULTIVITAMIN) tablet Take 1 tablet by mouth daily.    Marland Kitchen rOPINIRole (REQUIP) 1 MG tablet Take 1 tablet (1 mg total) by mouth 3 (three) times daily. 270 tablet 3  . rosuvastatin (CRESTOR) 10 MG tablet Take 1 tablet (10 mg total) by mouth daily. 90 tablet 1   No facility-administered medications prior to visit.     Allergies  Allergen Reactions  . Hydrocodone Nausea Only  . Penicillins Other (See Comments)    Unknown childhood reaction    ROS As per HPI  PE: There were no vitals taken for this visit. ***  LABS:  ***  IMPRESSION AND PLAN:  No problem-specific Assessment & Plan notes found for this encounter.   FOLLOW UP: No Follow-up on file.

## 2017-02-17 ENCOUNTER — Encounter: Payer: Self-pay | Admitting: Family Medicine

## 2017-02-17 ENCOUNTER — Ambulatory Visit (HOSPITAL_BASED_OUTPATIENT_CLINIC_OR_DEPARTMENT_OTHER)
Admission: RE | Admit: 2017-02-17 | Discharge: 2017-02-17 | Disposition: A | Discharge status: Home / Self Care | Payer: 59 | Source: Ambulatory Visit | Attending: Family Medicine | Admitting: Family Medicine

## 2017-02-17 DIAGNOSIS — M25561 Pain in right knee: Secondary | ICD-10-CM | POA: Insufficient documentation

## 2017-02-17 DIAGNOSIS — M150 Primary generalized (osteo)arthritis: Secondary | ICD-10-CM | POA: Diagnosis not present

## 2017-02-17 DIAGNOSIS — G8929 Other chronic pain: Secondary | ICD-10-CM | POA: Diagnosis not present

## 2017-02-17 DIAGNOSIS — M159 Polyosteoarthritis, unspecified: Secondary | ICD-10-CM

## 2017-02-17 DIAGNOSIS — M8949 Other hypertrophic osteoarthropathy, multiple sites: Secondary | ICD-10-CM

## 2017-02-17 DIAGNOSIS — M769 Unspecified enthesopathy, lower limb, excluding foot: Secondary | ICD-10-CM | POA: Insufficient documentation

## 2017-02-17 DIAGNOSIS — M25562 Pain in left knee: Secondary | ICD-10-CM | POA: Insufficient documentation

## 2017-02-18 ENCOUNTER — Ambulatory Visit (INDEPENDENT_AMBULATORY_CARE_PROVIDER_SITE_OTHER): Payer: 59 | Admitting: Family Medicine

## 2017-02-18 VITALS — BP 165/88 | HR 74 | Temp 97.6°F | Resp 16 | Ht 76.0 in | Wt 262.5 lb

## 2017-02-18 DIAGNOSIS — G8929 Other chronic pain: Secondary | ICD-10-CM | POA: Diagnosis not present

## 2017-02-18 DIAGNOSIS — M170 Bilateral primary osteoarthritis of knee: Secondary | ICD-10-CM

## 2017-02-18 DIAGNOSIS — M25561 Pain in right knee: Secondary | ICD-10-CM | POA: Diagnosis not present

## 2017-02-18 DIAGNOSIS — M25562 Pain in left knee: Secondary | ICD-10-CM

## 2017-02-18 MED ORDER — METHYLPREDNISOLONE ACETATE 40 MG/ML IJ SUSP
40.0000 mg | Freq: Once | INTRAMUSCULAR | Status: AC
  Administered 2017-02-18: 40 mg via INTRAMUSCULAR

## 2017-02-18 NOTE — Patient Instructions (Signed)
Ice knee for 20 min 1-2 times in the next 4-8 hours.   Starting tomorrow, do frequent range of motion (NON-Weight Bearing) exercises over the next few days.

## 2017-02-18 NOTE — Progress Notes (Signed)
OFFICE VISIT  02/18/2017   CC:  Chief Complaint  Patient presents with  . Knee Injection    HPI:    Patient is a 71 y.o. Caucasian male who presents for intraarticular steroid injection in left knee. Recent eval here: H&P c/w osteoarthritic pain, chronic and worsening, leading to significant impairment in function/gait alteration. X-ray showed mild DJD changes, w/out other abnormality. I recommended trial of steroid injection in joint.  Past Medical History:  Diagnosis Date  . Adult ADHD    adderall caused elevated bp  . Anxiety and depression   . At risk for sleep apnea    STOP-BANG= 5      SENT TO PCP 11-05-2014  . Branch retinal artery occlusion of right eye   . Chronic pain   . Disequilibrium syndrome    Carotid dopplers and MRI brain NORMAL x 2 (including eighth nerve studies on most recent MRI 08/2016).  . Erectile dysfunction   . Heart murmur   . History of concussion    june 2015--  hit head w/ syncope---  no residual  . History of memory loss    per prior PCP records.  . History of syncope    june 2015 --syncope w/ fall and pt states has happened 5 more times , the last one being jan 2016,,  states had battery of test and unknown cause  . Hyperlipidemia 02/2016   atorva 02/2016---lipid #s worse so changed to generic crestor.  Much improved on crestor.  . Hypertension   . IFG (impaired fasting glucose) 02/2016   HbA1c 5 %.  . Insomnia   . Lower urinary tract symptoms (LUTS)   . Mild aortic stenosis    per echo valve area 1.78cm^2  . Myalgia and myositis, unspecified   . Osteoarthritis, multiple sites    C-spine, knees, "all my joints basically"  . Prostate cancer [facility]) urologist-  dr ottelin/  oncologist-  dr Valere Dross   2016 Stage T1c,  Gleason 4+3,  PSA 5.26, vol 49.19cc  . Restless leg   . Thrombocytopenia (HCC)    >100K.  Marland Kitchen Upper airway cough syndrome 2018   Dr. Halford Chessman.  Cough therapy maximized at f/u 07/2016; referred to ENT by Pulm.  Allergy testing OK.  Improved as of 08/05/16 pulm f/u.  Marland Kitchen Vertebrobasilar insufficiency    per neurologist note dr Felecia Shelling-- this is possible causing syncope-- no tx just be careful getting up from sitting position  . Wears glasses     Past Surgical History:  Procedure Laterality Date  . Carotid Dopplers  08/01/2014   NORMAL (Dr. Felecia Shelling)  . CERVICAL FUSION  x1  last one 2010  . COLONOSCOPY  02/2004  . EYE SURGERY Bilateral 2013   5 right eye, 3 left eye surgeries (including cataract extraction's iol w/ lens implant, other surgery's for post-op lens fungal infection and floaters)  . GOLD SEED IMPLANT N/A 02/22/2015   Procedure: GOLD SEED IMPLANT;  Surgeon: Kathie Rhodes, MD;  Location: [facility];  Service: Urology;  Laterality: N/A;  . LEFT THIGH QUADRICEP MUSCLE BIOPSY'S  12-31-2005  . LUMBAR FUSION  x2  last one 2010  . MRA/MRI w/out contrast--brain  12/2014   mild, age-related chronic microvascular ischemic change (Dr. Felecia Shelling).  Marland Kitchen RADIOACTIVE SEED IMPLANT N/A 05/17/2015   Procedure: RADIOACTIVE SEED IMPLANT/BRACHYTHERAPY IMPLANT;  Surgeon: Kathie Rhodes, MD;  Location: [place];  Service: Urology;  Laterality: N/A;  . TRANSRECTAL ULTRASOUND N/A 11/12/2014   Procedure: TRANSRECTAL ULTRASOUND AND  BIOPSY  ;  Surgeon: Kathie Rhodes, MD;  Location: [facility];  Service: Urology;  Laterality: N/A;  . TRANSTHORACIC ECHOCARDIOGRAM  03-29-2014   mild concentric LVH/  ef 37-85%/  grade I diastolic dysfunction/  AV poorly visualized, possibly bicupid;  mild AS,  valve area 1.78cm^2,  mean grandient 62mm Hg/  trivial TR    Outpatient Medications Prior to Visit  Medication Sig Dispense Refill  . amphetamine-dextroamphetamine (ADDERALL XR) 30 MG 24 hr capsule 2 caps po qAM 60 capsule 0  . aspirin EC 81 MG tablet Take 81 mg by mouth daily.    Marland Kitchen b complex vitamins capsule Take 1 capsule by mouth daily.    Marland Kitchen CIALIS 5 MG tablet Take 1 tablet by mouth daily.    . clonazePAM  (KLONOPIN) 0.5 MG tablet 1 tab po tid for restless legs syndrome 90 tablet 5  . cloNIDine (CATAPRES) 0.1 MG tablet TAKE 1 TAB BY MOUTH TWICE A DAY AS NEEDED BLOOD PRESSURE >170 ON TOP OR >100 ON BOTTOM 60 tablet 0  . meclizine (ANTIVERT) 25 MG tablet Take 1 tablet (25 mg total) by mouth 3 (three) times daily as needed for dizziness. 90 tablet 6  . Multiple Vitamin (MULTIVITAMIN) tablet Take 1 tablet by mouth daily.    Marland Kitchen rOPINIRole (REQUIP) 1 MG tablet Take 1 tablet (1 mg total) by mouth 3 (three) times daily. 270 tablet 3  . rosuvastatin (CRESTOR) 10 MG tablet Take 1 tablet (10 mg total) by mouth daily. 90 tablet 1   No facility-administered medications prior to visit.     Allergies  Allergen Reactions  . Hydrocodone Nausea Only  . Penicillins Other (See Comments)    Unknown childhood reaction    ROS As per HPI  PE: Blood pressure (!) 165/88, pulse 74, temperature 97.6 F (36.4 C), temperature source Oral, resp. rate 16, height 6\' 4"  (1.93 m), weight 262 lb 8 oz (119.1 kg), SpO2 97 %. Gen: Alert, well appearing.  Patient is oriented to person, place, time, and situation. AFFECT: pleasant, lucid thought and speech. Left knee: ROM intact.  No effusion, warmth or erythema. No tenderness to palpation, no instability.  LABS:  Lab Results  Component Value Date   WBC 4.1 07/22/2016   HGB 14.9 07/22/2016   HCT 45.2 07/22/2016   MCV 92.5 07/22/2016   PLT 100.0 (L) 07/22/2016   IMPRESSION AND PLAN:  Knee osteoarthritis bilat: symptoms in L>>R. Will proceed with L knee steroid injection.  Procedure: Therapeutic Left knee injection.  The patient's clinical condition is marked by substantial pain and/or significant functional disability.  Other conservative therapy has not provided relief, is contraindicated, or not appropriate.  There is a reasonable likelihood that injection will significantly improve the patient's pain and/or functional disability. Cleaned skin with alcohol swab,  used medial approach with pt seated (90 flexed to 90 deg) to enter knee joint, Injected 29ml of depo medrol 40mg /ml and 2 ml of plain 2% lidocaine into joint space without resistance.  No immediate complications.  Patient tolerated procedure well.  Post-injection care discussed, including 20 min of icing 1-2 times in the next 4-8 hours, frequent non weight-bearing ROM exercises over the next few days, and general pain medication management.  An After Visit Summary was printed and given to the patient.  FOLLOW UP: Return for Keep appt already set for 02/26/17.  Signed:  Crissie Sickles, MD           02/18/2017

## 2017-02-18 NOTE — Addendum Note (Signed)
Addended by: Onalee Hua on: 02/18/2017 02:40 PM   Modules accepted: Orders

## 2017-02-26 ENCOUNTER — Encounter: Payer: Self-pay | Admitting: Family Medicine

## 2017-02-26 ENCOUNTER — Other Ambulatory Visit: Payer: Self-pay

## 2017-02-26 ENCOUNTER — Encounter: Payer: Self-pay | Admitting: Internal Medicine

## 2017-02-26 ENCOUNTER — Ambulatory Visit (INDEPENDENT_AMBULATORY_CARE_PROVIDER_SITE_OTHER): Payer: 59 | Admitting: Family Medicine

## 2017-02-26 ENCOUNTER — Other Ambulatory Visit: Payer: Self-pay | Admitting: Family Medicine

## 2017-02-26 VITALS — BP 130/74 | HR 89 | Temp 97.9°F | Resp 16 | Ht 76.0 in | Wt 258.8 lb

## 2017-02-26 DIAGNOSIS — G8929 Other chronic pain: Secondary | ICD-10-CM | POA: Diagnosis not present

## 2017-02-26 DIAGNOSIS — F988 Other specified behavioral and emotional disorders with onset usually occurring in childhood and adolescence: Secondary | ICD-10-CM | POA: Diagnosis not present

## 2017-02-26 DIAGNOSIS — E669 Obesity, unspecified: Secondary | ICD-10-CM | POA: Diagnosis not present

## 2017-02-26 DIAGNOSIS — M25561 Pain in right knee: Secondary | ICD-10-CM | POA: Diagnosis not present

## 2017-02-26 DIAGNOSIS — Z0001 Encounter for general adult medical examination with abnormal findings: Secondary | ICD-10-CM

## 2017-02-26 DIAGNOSIS — Z23 Encounter for immunization: Secondary | ICD-10-CM

## 2017-02-26 DIAGNOSIS — M89319 Hypertrophy of bone, unspecified shoulder: Secondary | ICD-10-CM | POA: Diagnosis not present

## 2017-02-26 DIAGNOSIS — M1711 Unilateral primary osteoarthritis, right knee: Secondary | ICD-10-CM

## 2017-02-26 DIAGNOSIS — E7800 Pure hypercholesterolemia, unspecified: Secondary | ICD-10-CM

## 2017-02-26 DIAGNOSIS — Z0000 Encounter for general adult medical examination without abnormal findings: Secondary | ICD-10-CM

## 2017-02-26 DIAGNOSIS — Z1211 Encounter for screening for malignant neoplasm of colon: Secondary | ICD-10-CM

## 2017-02-26 MED ORDER — AMPHETAMINE-DEXTROAMPHET ER 30 MG PO CP24: ORAL_CAPSULE | ORAL | 0 refills | Status: DC

## 2017-02-26 MED ORDER — METHYLPREDNISOLONE ACETATE 80 MG/ML IJ SUSP
80.0000 mg | Freq: Once | INTRAMUSCULAR | Status: AC
  Administered 2017-02-26: 80 mg via INTRA_ARTICULAR

## 2017-02-26 MED ORDER — ZOSTER VAC RECOMB ADJUVANTED 50 MCG/0.5ML IM SUSR: 0.5000 mL | Freq: Once | INTRAMUSCULAR | 1 refills | Status: AC

## 2017-02-26 NOTE — Telephone Encounter (Signed)
[facility]  RF request for meclizine LOV: 09/24/16 Next ov: Today Last written: 06/11/16 #90 w/ 6RF  Please advise. Thanks.

## 2017-02-26 NOTE — Progress Notes (Signed)
Office Note 02/26/2017  CC:  Chief Complaint  Patient presents with  . Annual Exam    Pt is not fasting.     HPI:  Andrew Stevenson is a 71 y.o. White male who is here for annual health maintenance exam.  Has had a focal bony enlargement of the R sternoclavicular junction for about 4 yrs now, unchanged, no pain. No hx of any trauma to the region.  Went to ortho for it once and the MD insisted he had this as a result of a trauma to the area in the past--but pt swears he has never had such a thing.  Says no x-ray has ever been done.  He has chronic bilat, L>R knee pain.  X-rays recently showed only mild osteoarthritis. He has not had any sign of effusion or instability or infection. I did steroid injection in L knee 02/18/17 and he notes 90% improvement from that. He does want R knee injected today.  Adult ADD: he feels like current adderall dosing is helping well with his focus, concentration, task completion, etc. No adverse side effects.   Past Medical History:  Diagnosis Date  . Adult ADHD    adderall caused elevated bp  . Anxiety and depression   . At risk for sleep apnea    STOP-BANG= 5      SENT TO PCP 11-05-2014  . Branch retinal artery occlusion of right eye   . Chronic pain   . Disequilibrium syndrome    Carotid dopplers and MRI brain NORMAL x 2 (including eighth nerve studies on most recent MRI 08/2016).  . Erectile dysfunction   . Heart murmur   . History of concussion    june 2015--  hit head w/ syncope---  no residual  . History of memory loss    per prior PCP records.  . History of syncope    june 2015 --syncope w/ fall and pt states has happened 5 more times , the last one being jan 2016,,  states had battery of test and unknown cause  . Hyperlipidemia 02/2016   atorva 02/2016---lipid #s worse so changed to generic crestor.  Much improved on crestor.  . Hypertension   . IFG (impaired fasting glucose) 02/2016   HbA1c 5 %.  . Insomnia   . Lower urinary  tract symptoms (LUTS)   . Mild aortic stenosis    per echo valve area 1.78cm^2  . Myalgia and myositis, unspecified   . Osteoarthritis, multiple sites    C-spine, knees, "all my joints basically"  . Prostate cancer [facility]) urologist-  dr ottelin/  oncologist-  dr Valere Dross   2016 Stage T1c,  Gleason 4+3,  PSA 5.26, vol 49.19cc  . Restless leg   . Thrombocytopenia (HCC)    >100K.  Marland Kitchen Upper airway cough syndrome 2018   Dr. Halford Chessman.  Cough therapy maximized at f/u 07/2016; referred to ENT by Pulm.  Allergy testing OK. Improved as of 08/05/16 pulm f/u.  Marland Kitchen Vertebrobasilar insufficiency    per neurologist note dr Felecia Shelling-- this is possible causing syncope-- no tx just be careful getting up from sitting position  . Wears glasses     Past Surgical History:  Procedure Laterality Date  . Carotid Dopplers  08/01/2014   NORMAL (Dr. Felecia Shelling)  . CERVICAL FUSION  x1  last one 2010  . COLONOSCOPY  02/2004  . EYE SURGERY Bilateral 2013   5 right eye, 3 left eye surgeries (including cataract extraction's iol w/ lens implant, other surgery's  for post-op lens fungal infection and floaters)  . GOLD SEED IMPLANT N/A 02/22/2015   Procedure: GOLD SEED IMPLANT;  Surgeon: Kathie Rhodes, MD;  Location: [facility];  Service: Urology;  Laterality: N/A;  . LEFT THIGH QUADRICEP MUSCLE BIOPSY'S  12-31-2005  . LUMBAR FUSION  x2  last one 2010  . MRA/MRI w/out contrast--brain  12/2014   mild, age-related chronic microvascular ischemic change (Dr. Felecia Shelling).  Marland Kitchen RADIOACTIVE SEED IMPLANT N/A 05/17/2015   Procedure: RADIOACTIVE SEED IMPLANT/BRACHYTHERAPY IMPLANT;  Surgeon: Kathie Rhodes, MD;  Location: [place];  Service: Urology;  Laterality: N/A;  . TRANSRECTAL ULTRASOUND N/A 11/12/2014   Procedure: TRANSRECTAL ULTRASOUND AND BIOPSY  ;  Surgeon: Kathie Rhodes, MD;  Location: [facility];  Service: Urology;  Laterality: N/A;  . TRANSTHORACIC ECHOCARDIOGRAM  03-29-2014   mild concentric LVH/   ef 84-16%/  grade I diastolic dysfunction/  AV poorly visualized, possibly bicupid;  mild AS,  valve area 1.78cm^2,  mean grandient 58mm Hg/  trivial TR    Family History  Problem Relation Age of Onset  . Hypertension Mother   . Alcohol abuse Father   . Arthritis Father   . Lung cancer Father        smoker  . Diabetes Brother   . Alcohol abuse Maternal Grandfather   . Stroke Maternal Grandfather   . Cancer Paternal Grandmother   . Alcohol abuse Paternal Grandfather   . Lung cancer Brother        smoker    Social History   Socioeconomic History  . Marital status: Married    Spouse name: Not on file  . Number of children: Not on file  . Years of education: Not on file  . Highest education level: Not on file  Social Needs  . Financial resource strain: Not on file  . Food insecurity - worry: Not on file  . Food insecurity - inability: Not on file  . Transportation needs - medical: Not on file  . Transportation needs - non-medical: Not on file  Occupational History  . Occupation: UNEMPLOYED  Tobacco Use  . Smoking status: Never Smoker  . Smokeless tobacco: Current User    Types: Snuff  . Tobacco comment: using snuff x 25 years  Substance and Sexual Activity  . Alcohol use: Yes    Alcohol/week: 0.0 oz    Comment: rare  . Drug use: No  . Sexual activity: Not on file  Other Topics Concern  . Not on file  Social History Narrative   Married, 1 daughter.   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   No tob.   Occ alcohol.    Outpatient Medications Prior to Visit  Medication Sig Dispense Refill  . aspirin EC 81 MG tablet Take 81 mg by mouth daily.    Marland Kitchen b complex vitamins capsule Take 1 capsule by mouth daily.    Marland Kitchen CIALIS 5 MG tablet Take 1 tablet by mouth daily.    . clonazePAM (KLONOPIN) 0.5 MG tablet 1 tab po tid for restless legs syndrome 90 tablet 5  . cloNIDine (CATAPRES) 0.1 MG tablet TAKE 1 TAB BY MOUTH TWICE A DAY AS  NEEDED BLOOD PRESSURE >170 ON TOP OR >100 ON BOTTOM 60 tablet 0  . meclizine (ANTIVERT) 25 MG tablet Take 1 tablet (25 mg total) by mouth 3 (three) times daily as needed for dizziness. 90 tablet 6  . Multiple Vitamin (MULTIVITAMIN) tablet  Take 1 tablet by mouth daily.    Marland Kitchen rOPINIRole (REQUIP) 1 MG tablet Take 1 tablet (1 mg total) by mouth 3 (three) times daily. 270 tablet 3  . rosuvastatin (CRESTOR) 10 MG tablet Take 1 tablet (10 mg total) by mouth daily. 90 tablet 1  . amphetamine-dextroamphetamine (ADDERALL XR) 30 MG 24 hr capsule 2 caps po qAM 60 capsule 0   No facility-administered medications prior to visit.     Allergies  Allergen Reactions  . Hydrocodone Nausea Only  . Penicillins Other (See Comments)    Unknown childhood reaction    ROS Review of Systems  Constitutional: Negative for appetite change, chills, fatigue and fever.  HENT: Negative for congestion, dental problem, ear pain and sore throat.   Eyes: Negative for discharge, redness and visual disturbance.  Respiratory: Negative for cough, chest tightness, shortness of breath and wheezing.   Cardiovascular: Negative for chest pain, palpitations and leg swelling.  Gastrointestinal: Negative for abdominal pain, blood in stool, diarrhea, nausea and vomiting.  Genitourinary: Negative for difficulty urinating, dysuria, flank pain, frequency, hematuria and urgency.  Musculoskeletal: Positive for arthralgias (bilat knees and LL's diffusely). Negative for back pain, joint swelling, myalgias and neck stiffness.  Skin: Negative for pallor and rash.  Neurological: Negative for dizziness, speech difficulty, weakness and headaches.  Hematological: Negative for adenopathy. Does not bruise/bleed easily.  Psychiatric/Behavioral: Negative for confusion and sleep disturbance. The patient is not nervous/anxious.     PE; Blood pressure 130/74, pulse 89, temperature 97.9 F (36.6 C), temperature source Oral, resp. rate 16, height 6\' 4"   (1.93 m), weight 258 lb 12 oz (117.4 kg), SpO2 97 %. Body mass index is 31.5 kg/m.  Gen: Alert, well appearing.  Patient is oriented to person, place, time, and situation. AFFECT: pleasant, lucid thought and speech. ENT: Ears: EACs clear, normal epithelium.  TMs with good light reflex and landmarks bilaterally.  Eyes: no injection, icteris, swelling, or exudate.  EOMI, PERRLA. Nose: no drainage or turbinate edema/swelling.  No injection or focal lesion.  Mouth: lips without lesion/swelling.  Oral mucosa pink and moist.  Dentition intact and without obvious caries or gingival swelling.  Oropharynx without erythema, exudate, or swelling.  Neck: supple/nontender.  No LAD, mass, or TM.  Carotid pulses 2+ bilaterally, without bruits. CV: RRR, no m/r/g.   LUNGS: CTA bilat, nonlabored resps, good aeration in all lung fields. ABD: soft, NT, ND, BS normal.  No hepatospenomegaly or mass.  No bruits. EXT: no clubbing, cyanosis, or edema.  Musculoskeletal: no joint swelling, erythema, warmth, or tenderness.  ROM of all joints intact. Right sternoclavicular junction with significantly palpable --approx 3 cm diameter--bony enlargement focally.  No tenderness.  No overlying erythema. Skin - no sores or suspicious lesions or rashes or color changes   Pertinent labs:  Lab Results  Component Value Date   TSH 2.00 02/27/2016   Lab Results  Component Value Date   WBC 4.1 07/22/2016   HGB 14.9 07/22/2016   HCT 45.2 07/22/2016   MCV 92.5 07/22/2016   PLT 100.0 (L) 07/22/2016   Lab Results  Component Value Date   CREATININE 0.88 11/17/2016   BUN 25 11/17/2016   NA 142 11/17/2016   K 4.3 11/17/2016   CL 107 11/17/2016   CO2 26 11/17/2016   Lab Results  Component Value Date   ALT 22 02/27/2016   AST 24 02/27/2016   ALKPHOS 42 02/27/2016   BILITOT 1.0 02/27/2016   Lab Results  Component Value Date  CHOL 116 11/17/2016   Lab Results  Component Value Date   HDL 41 11/17/2016   Lab Results   Component Value Date   LDLCALC 55 11/17/2016   Lab Results  Component Value Date   TRIG 102 11/17/2016   Lab Results  Component Value Date   CHOLHDL 2.8 11/17/2016   Lab Results  Component Value Date   HGBA1C 5.0 02/27/2016    ASSESSMENT AND PLAN:   1) Chronic R knee pain; mild osteoarthritis--pt desires steroid injection today b/c he had such good response to L knee injection.  Procedure: Therapeutic knee injection.  The patient's clinical condition is marked by substantial pain and/or significant functional disability.  Other conservative therapy has not provided relief, is contraindicated, or not appropriate.  There is a reasonable likelihood that injection will significantly improve the patient's pain and/or functional disability. Cleaned skin with alcohol swab, used anterior approach with pt sitting (knee at 90 deg) to enter knee joint, Injected 1 ml of 80mg /ml depo medrol + 2  ml of plain 2% lidocain into joint space without resistance.  No immediate complications.  Patient tolerated procedure well.  Post-injection care discussed, including 20 min of icing 1-2 times in the next 4-8 hours, frequent non weight-bearing ROM exercises over the next few days, and general pain medication management.  2) R Polo junction bony hypertrophy--stable over the last 4 yrs: Check x-ray today.  3) Health maintenance exam: Reviewed age and gender appropriate health maintenance issues (prudent diet, regular exercise, health risks of tobacco and excessive alcohol, use of seatbelts, fire alarms in home, use of sunscreen).  Also reviewed age and gender appropriate health screening as well as vaccine recommendations. Vaccines: prevnar 13 due---given today.   Flu---declined this today.   Shingrix discussed--rx printed for this today. Labs: CMET (Hyperlip). Prostate ca screening: pt with hx of prostate ca, followed by urology.  Next f/u is in 3d. Colon ca screening: last colonoscopy was 2005.  GI referral  ordered today.  4) Adult ADD: The current medical regimen is effective;  continue present plan and medications. I printed rx's for adderall XR 30mg , 2 qAM, #60 today for this month, Jan 2019, and Feb 2019.  Appropriate fill on/after date was noted on each rx.; An After Visit Summary was printed and given to the patient.  FOLLOW UP:  Return in about 6 months (around 08/27/2017) for routine chronic illness f/u (30 min).  Signed:  Crissie Sickles, MD           02/26/2017

## 2017-02-27 LAB — COMPREHENSIVE METABOLIC PANEL
AG Ratio: 1.9 (calc) (ref 1.0–2.5)
ALT: 30 U/L (ref 9–46)
AST: 29 U/L (ref 10–35)
Albumin: 4.3 g/dL (ref 3.6–5.1)
Alkaline phosphatase (APISO): 62 U/L (ref 40–115)
BUN: 22 mg/dL (ref 7–25)
CO2: 27 mmol/L (ref 20–32)
Calcium: 9.5 mg/dL (ref 8.6–10.3)
Chloride: 107 mmol/L (ref 98–110)
Creat: 1.15 mg/dL (ref 0.70–1.18)
Globulin: 2.3 g/dL (calc) (ref 1.9–3.7)
Glucose, Bld: 111 mg/dL — ABNORMAL HIGH (ref 65–99)
Potassium: 4.2 mmol/L (ref 3.5–5.3)
Sodium: 144 mmol/L (ref 135–146)
Total Bilirubin: 1.2 mg/dL (ref 0.2–1.2)
Total Protein: 6.6 g/dL (ref 6.1–8.1)

## 2017-02-27 LAB — EXTRA LAV TOP TUBE

## 2017-04-08 ENCOUNTER — Telehealth: Payer: Self-pay | Admitting: *Deleted

## 2017-04-08 NOTE — Telephone Encounter (Signed)
Called patient and left message on Voicemail for him to call [phone] to reschedule his PV before 5 today.  I told him we would have to cancel the Colonoscopy for 2/7 if he doesn't call back before 5 today.  B.Koren Sermersheim, CMA

## 2017-04-12 ENCOUNTER — Encounter: Payer: Self-pay | Admitting: Family Medicine

## 2017-04-12 ENCOUNTER — Other Ambulatory Visit: Payer: Self-pay

## 2017-04-12 MED ORDER — ROPINIROLE HCL 1 MG PO TABS: 1.0000 mg | ORAL_TABLET | Freq: Three times a day (TID) | ORAL | 3 refills | Status: DC

## 2017-04-16 IMAGING — CR DG CHEST 2V
2 series · 2 of 2 positions shown · non-contrast
Comparison: 03/28/2014 and 09/26/2010.

CLINICAL DATA: Preop exam. Patient with prostate carcinoma. History
of hypertension.

EXAM:
CHEST  2 VIEW

[w chest pa]
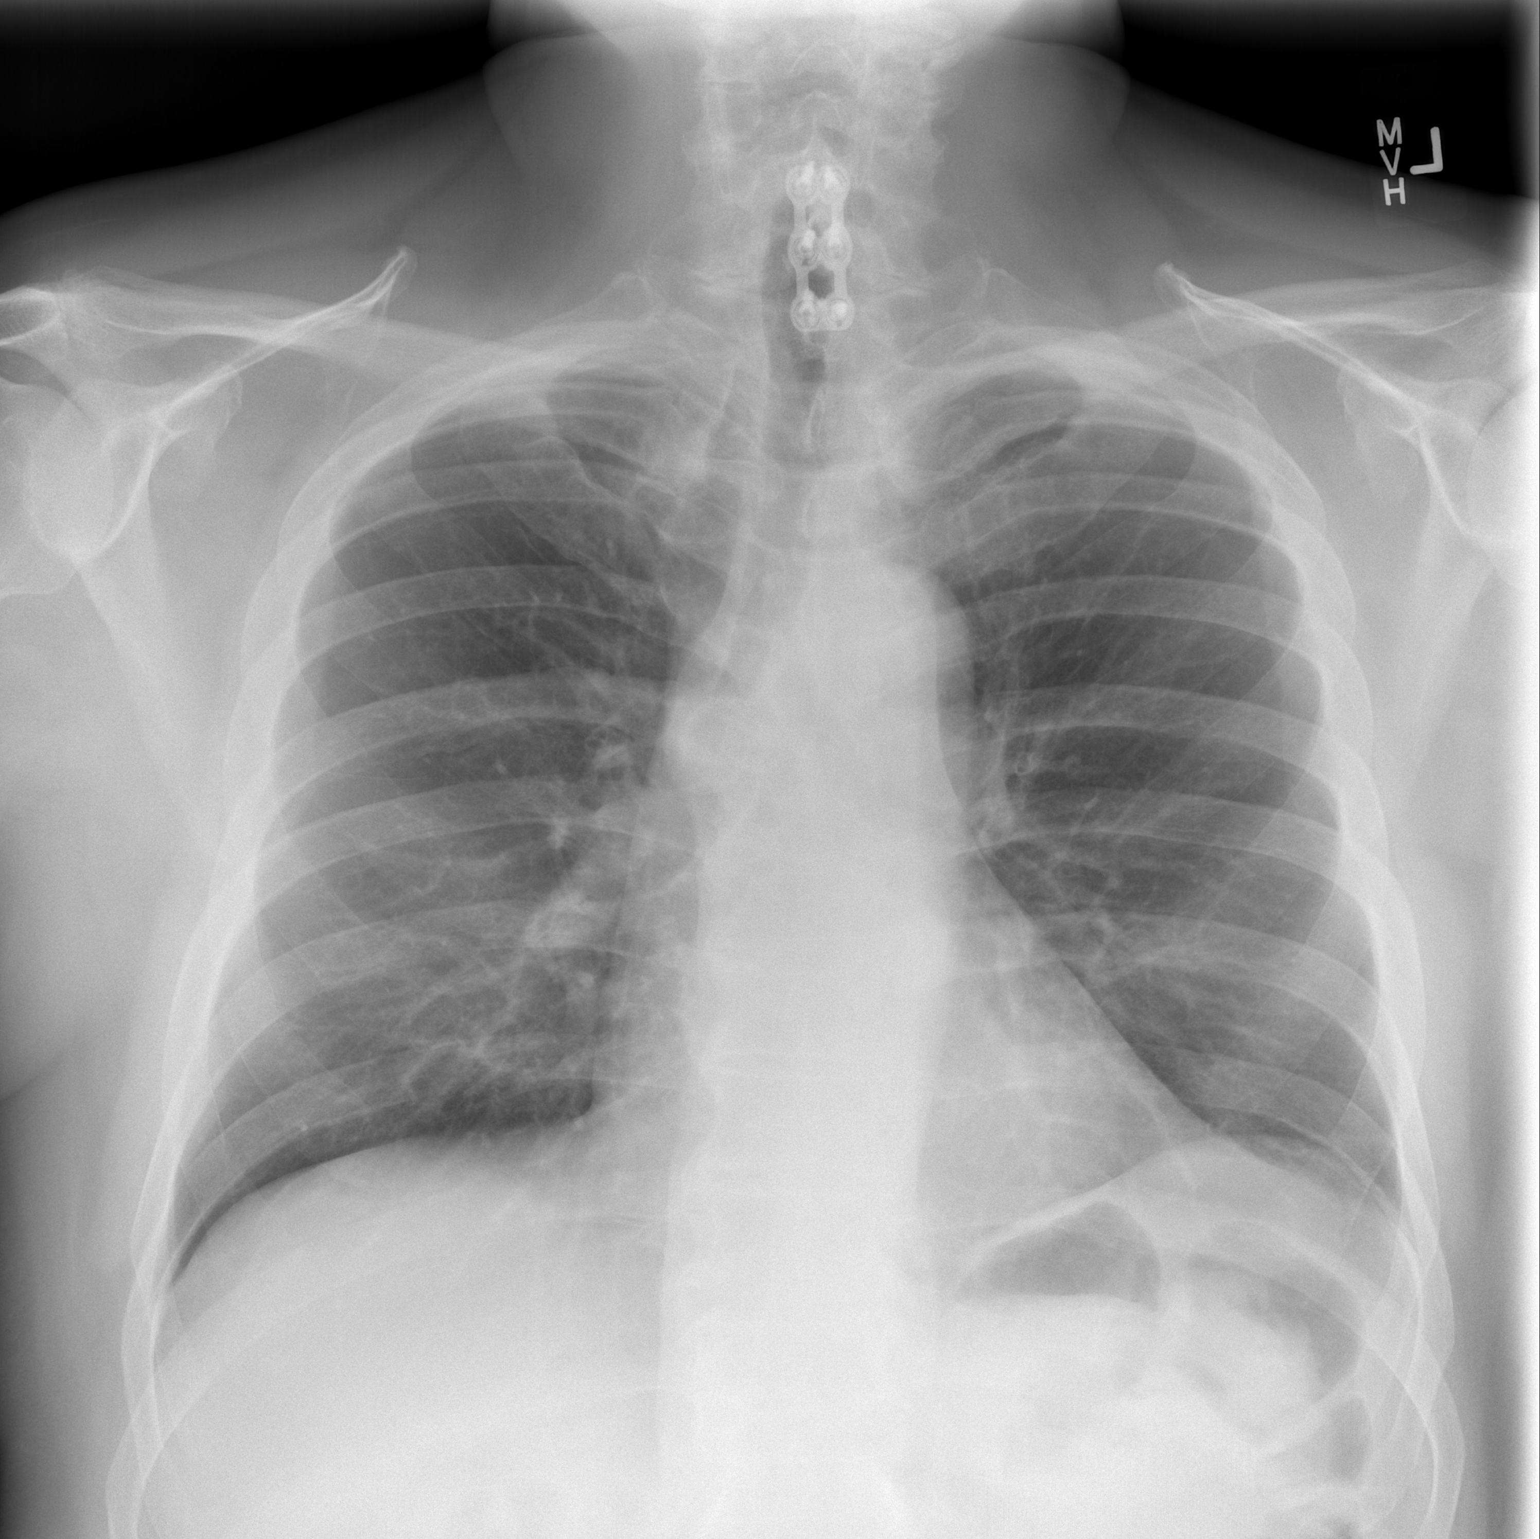

[w chest lat]
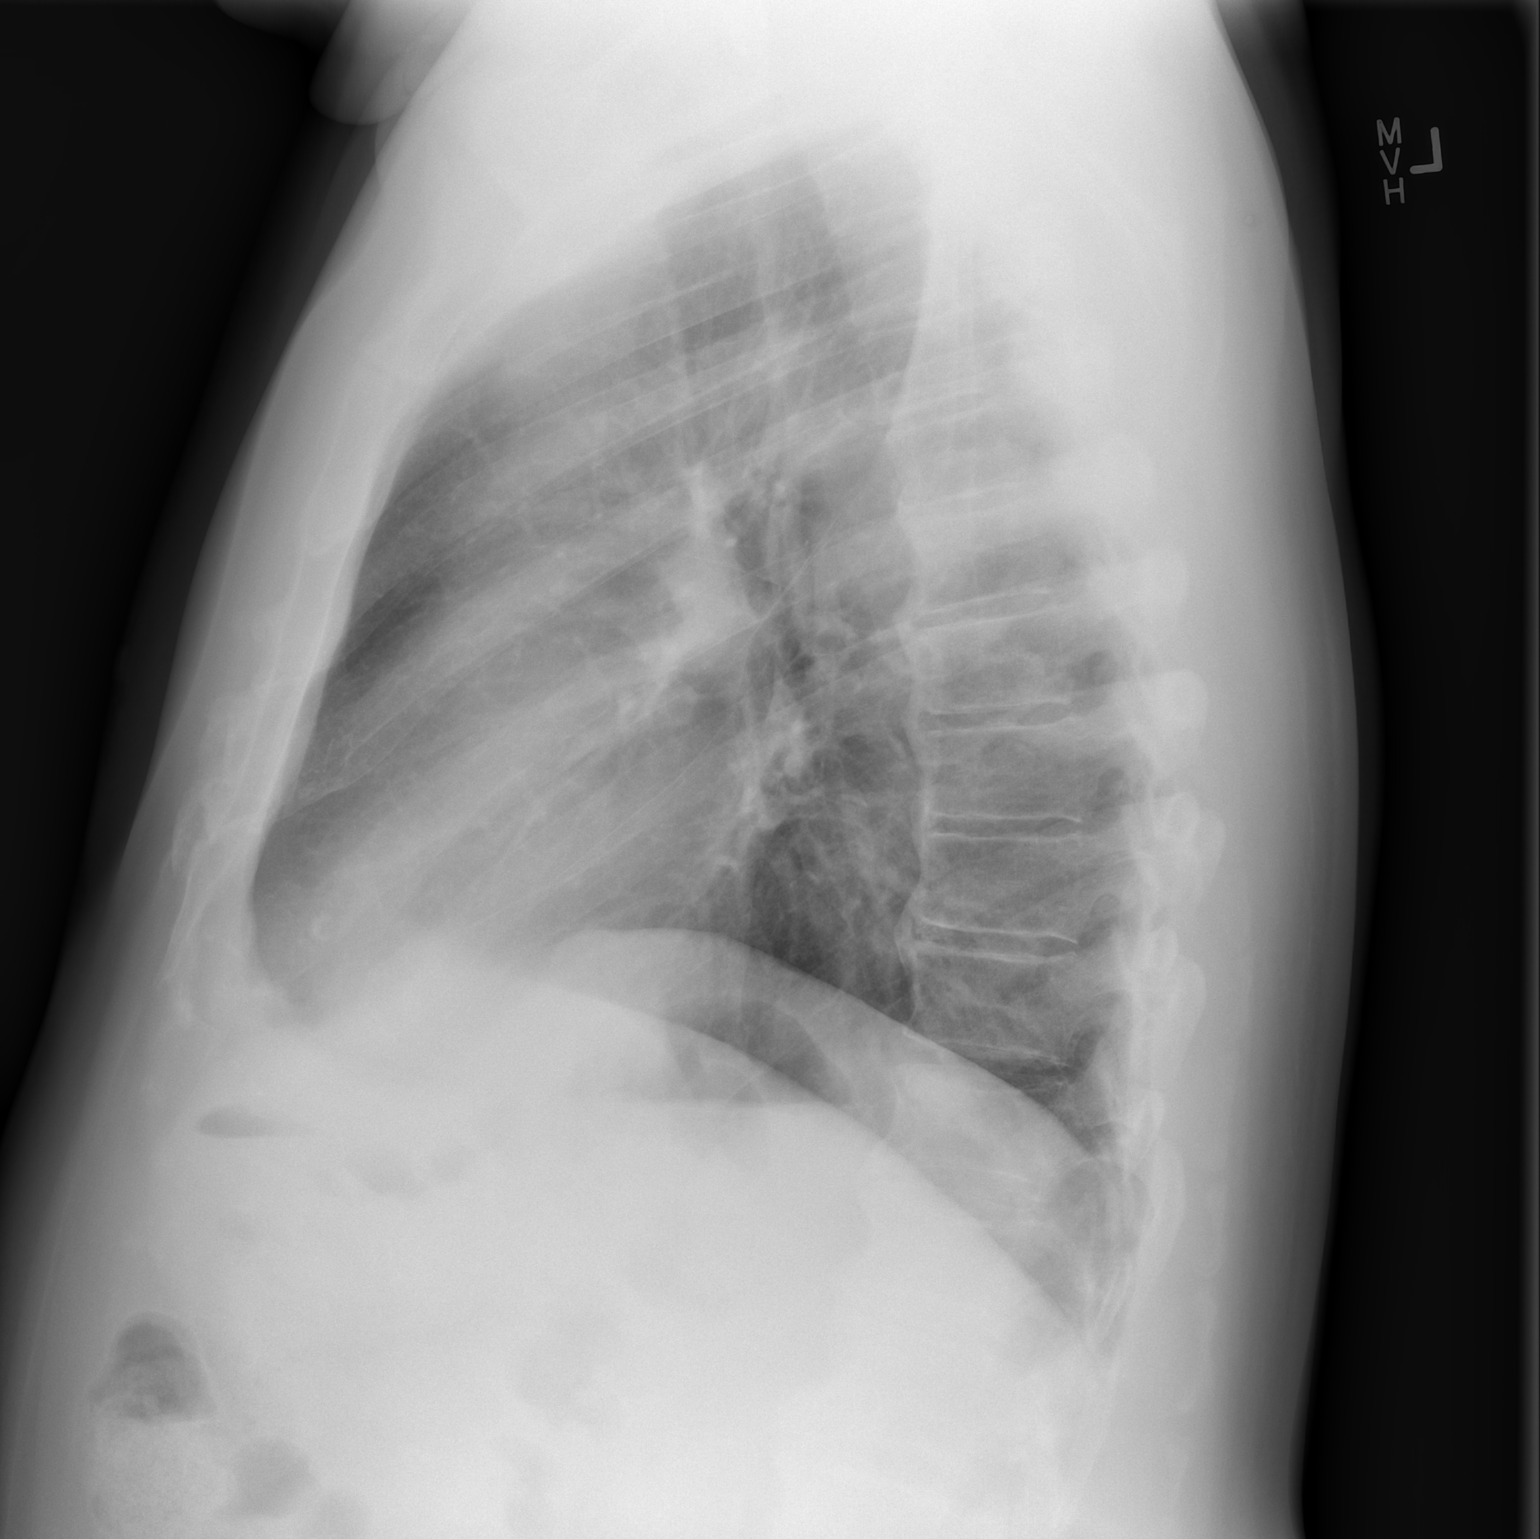

[2 of 2 positions shown; findings below may reference images not displayed]

FINDINGS: Cardiac silhouette is normal in size and configuration. Normal
mediastinal and hilar contours.

Clear lungs.  No pleural effusion or pneumothorax.

Stable changes from a previous low anterior cervical spine fusion.
IMPRESSION: No acute cardiopulmonary disease.

## 2017-04-22 ENCOUNTER — Encounter: Payer: 59 | Admitting: Internal Medicine

## 2017-04-27 ENCOUNTER — Other Ambulatory Visit: Payer: Self-pay

## 2017-04-27 MED ORDER — ROSUVASTATIN CALCIUM 10 MG PO TABS: 10.0000 mg | ORAL_TABLET | Freq: Every day | ORAL | 1 refills | Status: DC

## 2017-04-27 NOTE — Telephone Encounter (Signed)
Refill request sent

## 2017-05-11 ENCOUNTER — Ambulatory Visit (AMBULATORY_SURGERY_CENTER): Payer: Self-pay

## 2017-05-11 VITALS — Ht 76.0 in | Wt 248.6 lb

## 2017-05-11 DIAGNOSIS — Z1211 Encounter for screening for malignant neoplasm of colon: Secondary | ICD-10-CM

## 2017-05-11 NOTE — Progress Notes (Signed)
Per pt, no allergies to soy or egg products.Pt not taking any weight loss meds or using  O2 at home.  Pt refused emmi video. 

## 2017-05-13 ENCOUNTER — Encounter: Payer: Self-pay | Admitting: Internal Medicine

## 2017-05-24 ENCOUNTER — Ambulatory Visit: Payer: 59 | Admitting: Family Medicine

## 2017-05-25 ENCOUNTER — Other Ambulatory Visit: Payer: Self-pay

## 2017-05-25 ENCOUNTER — Encounter: Payer: Self-pay | Admitting: Internal Medicine

## 2017-05-25 ENCOUNTER — Ambulatory Visit (AMBULATORY_SURGERY_CENTER): Payer: 59 | Admitting: Internal Medicine

## 2017-05-25 VITALS — BP 151/81 | HR 66 | Temp 98.6°F | Resp 12 | Ht 76.0 in | Wt 248.0 lb

## 2017-05-25 DIAGNOSIS — D124 Benign neoplasm of descending colon: Secondary | ICD-10-CM | POA: Diagnosis not present

## 2017-05-25 DIAGNOSIS — Z860101 Personal history of adenomatous and serrated colon polyps: Secondary | ICD-10-CM

## 2017-05-25 DIAGNOSIS — Z1211 Encounter for screening for malignant neoplasm of colon: Secondary | ICD-10-CM

## 2017-05-25 DIAGNOSIS — Z86010 Personal history of colonic polyps: Secondary | ICD-10-CM

## 2017-05-25 DIAGNOSIS — D123 Benign neoplasm of transverse colon: Secondary | ICD-10-CM | POA: Diagnosis not present

## 2017-05-25 HISTORY — DX: Personal history of colonic polyps: Z86.010

## 2017-05-25 HISTORY — DX: Personal history of adenomatous and serrated colon polyps: Z86.0101

## 2017-05-25 MED ORDER — SODIUM CHLORIDE 0.9 % IV SOLN: 500.0000 mL | Freq: Once | INTRAVENOUS | Status: DC

## 2017-05-25 NOTE — Patient Instructions (Addendum)
I found and removed 2 tiny polyps. I will let you know pathology results and when/if to have another routine colonoscopy by mail and/or My Chart.  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 The hemorrhoids and proctitis might cause some bright red rectal bleeding at times. As long as not severe would not worry about that if it occurs.  I appreciate the opportunity to care for you. Gatha Mayer, MD, [facility]    Handouts : Polyps, Diverticulosis and Hemorrhoids.  YOU HAD AN ENDOSCOPIC PROCEDURE TODAY AT [facility]:   Refer to the procedure report that was given to you for any specific questions about what was found during the examination.  If the procedure report does not answer your questions, please call your gastroenterologist to clarify.  If you requested that your care partner not be given the details of your procedure findings, then the procedure report has been included in a sealed envelope for you to review at your convenience later.  YOU SHOULD EXPECT: Some feelings of bloating in the abdomen. Passage of more gas than usual.  Walking can help get rid of the air that was put into your GI tract during the procedure and reduce the bloating. If you had a lower endoscopy (such as a colonoscopy or flexible sigmoidoscopy) you may notice spotting of blood in your stool or on the toilet paper. If you underwent a bowel prep for your procedure, you may not have a normal bowel movement for a few days.  Please Note:  You might notice some irritation and congestion in your nose or some drainage.  This is from the oxygen used during your procedure.  There is no need for concern and it should clear up in a day or so.  SYMPTOMS TO REPORT IMMEDIATELY:   Following lower endoscopy (colonoscopy or flexible sigmoidoscopy):  Excessive amounts of  blood in the stool  Significant tenderness or worsening of abdominal pains  Swelling of the abdomen that is new, acute  Fever of 100F or higher   For urgent or emergent issues, a gastroenterologist can be reached at any hour by calling [phone].   DIET:  We do recommend a small meal at first, but then you may proceed to your regular diet.  Drink plenty of fluids but you should avoid alcoholic beverages for 24 hours.  ACTIVITY:  You should plan to take it easy for the rest of today and you should NOT DRIVE or use heavy machinery until tomorrow (because of the sedation medicines used during the test).    FOLLOW UP: Our staff will call the number listed on your records the next business day following your procedure to check on you and address any questions or concerns that you may have regarding the information given to you following your procedure. If we do not reach you, we will leave a message.  However, if you are feeling well and you are not experiencing any problems, there is no need to return our call.  We will assume that you have returned to your regular daily activities without incident.  If any biopsies were taken you will be contacted by phone or by letter within the next 1-3 weeks.  Please call us at [phone] if you have not heard about the biopsies in 3 weeks.    SIGNATURES/CONFIDENTIALITY: You and/or your care partner have signed paperwork  which will be entered into your electronic medical record.  These signatures attest to the fact that that the information above on your After Visit Summary has been reviewed and is understood.  Full responsibility of the confidentiality of this discharge information lies with you and/or your care-partner.

## 2017-05-25 NOTE — Progress Notes (Signed)
Pt's states no medical or surgical changes since previsit or office visit. 

## 2017-05-25 NOTE — Progress Notes (Signed)
Called to room to assist during endoscopic procedure.  Patient ID and intended procedure confirmed with present staff. Received instructions for my participation in the procedure from the performing physician.  

## 2017-05-25 NOTE — Progress Notes (Signed)
Report to PACU, RN, vss, BBS= Clear.  

## 2017-05-25 NOTE — Op Note (Addendum)
[facility] Patient Name: Andrew Stevenson Procedure Date: 05/25/2017 1:47 PM MRN: [id number] Endoscopist: Gatha Mayer , MD Age: 72 Referring MD:  Date of Birth: [birth year] Gender: Male Account #: [id number] Procedure:                Colonoscopy Indications:              Screening for colorectal malignant neoplasm Medicines:                Propofol per Anesthesia, Monitored Anesthesia Care Procedure:                Pre-Anesthesia Assessment:                           - Prior to the procedure, a History and Physical                            was performed, and patient medications and                            allergies were reviewed. The patient's tolerance of                            previous anesthesia was also reviewed. The risks                            and benefits of the procedure and the sedation                            options and risks were discussed with the patient.                            All questions were answered, and informed consent                            was obtained. Prior Anticoagulants: The patient has                            taken no previous anticoagulant or antiplatelet                            agents. ASA Grade Assessment: II - A patient with                            mild systemic disease. After reviewing the risks                            and benefits, the patient was deemed in                            satisfactory condition to undergo the procedure.                           After obtaining informed consent, the colonoscope  was passed under direct vision. Throughout the                            procedure, the patient's blood pressure, pulse, and                            oxygen saturations were monitored continuously. The                            Colonoscope was introduced through the anus and                            advanced to the the cecum, identified by   appendiceal orifice and ileocecal valve. The                            colonoscopy was performed without difficulty. The                            patient tolerated the procedure well. The quality                            of the bowel preparation was good. The ileocecal                            valve, appendiceal orifice, and rectum were                            photographed. The bowel preparation used was                            Miralax. Scope In: 2:00:06 PM Scope Out: 2:16:49 PM Scope Withdrawal Time: 0 hours 14 minutes 45 seconds  Total Procedure Duration: 0 hours 16 minutes 43 seconds  Findings:                 Two sessile polyps were found in the descending                            colon. The polyps were diminutive in size. These                            polyps were removed with a cold snare. Resection                            and retrieval were complete. Verification of                            patient identification for the specimen was done.                            Estimated blood loss was minimal.                           Multiple diverticula were found in the  sigmoid                            colon.                           Internal hemorrhoids were found.                           Multiple localized angioectasias without bleeding                            were found in the rectum. RADIATION PROCTITIS                           The exam was otherwise without abnormality on                            direct and retroflexion views. Complications:            No immediate complications. Estimated Blood Loss:     Estimated blood loss was minimal. Impression:               - Two diminutive polyps in the descending colon,                            removed with a cold snare. Resected and retrieved.                           - Diverticulosis in the sigmoid colon.                           - Internal hemorrhoids.                           - Multiple  non-bleeding colonic angioectasias.                            RADIATION PROCTITIS                           - The examination was otherwise normal on direct                            and retroflexion views. Recommendation:           - Patient has a contact number available for                            emergencies. The signs and symptoms of potential                            delayed complications were discussed with the                            patient. Return to normal activities tomorrow.  Written discharge instructions were provided to the                            patient.                           - Resume previous diet.                           - Continue present medications.                           - Repeat colonoscopy may be recommended. If so, the                            colonoscopy date will be determined after pathology                            results from today's exam become available for                            review. he is inclined to consider having a                            surveillance colonoscopy if applicable. Gatha Mayer, MD 05/25/2017 2:22:32 PM This report has been signed electronically.

## 2017-05-26 ENCOUNTER — Telehealth: Payer: Self-pay | Admitting: *Deleted

## 2017-05-26 NOTE — Telephone Encounter (Signed)
  Follow up Call-  Call back number 05/25/2017  Post procedure Call Back phone  # [phone]  Permission to leave phone message Yes  Some recent data might be hidden     Patient questions:  Do you have a fever, pain , or abdominal swelling? No. Pain Score  0 *  Have you tolerated food without any problems? Yes.    Have you been able to return to your normal activities? Yes.    Do you have any questions about your discharge instructions: Diet   No. Medications  No. Follow up visit  No.  Do you have questions or concerns about your Care? No.  Actions: * If pain score is 4 or above: No action needed, pain <4.

## 2017-05-31 ENCOUNTER — Encounter: Payer: Self-pay | Admitting: Family Medicine

## 2017-05-31 ENCOUNTER — Ambulatory Visit: Payer: 59 | Admitting: Family Medicine

## 2017-05-31 VITALS — BP 132/78 | HR 71 | Resp 16 | Wt 246.0 lb

## 2017-05-31 DIAGNOSIS — M1712 Unilateral primary osteoarthritis, left knee: Secondary | ICD-10-CM | POA: Diagnosis not present

## 2017-05-31 MED ORDER — METHYLPREDNISOLONE ACETATE 80 MG/ML IJ SUSP
80.0000 mg | Freq: Once | INTRAMUSCULAR | Status: AC
  Administered 2017-05-31: 80 mg via INTRA_ARTICULAR

## 2017-05-31 NOTE — Addendum Note (Signed)
Addended by: Gordy Councilman on: 05/31/2017 02:15 PM   Modules accepted: Orders

## 2017-05-31 NOTE — Progress Notes (Signed)
OFFICE VISIT  05/31/2017   CC:  Chief Complaint  Patient presents with  . Knee Pain    Left knee pain    HPI:    Patient is a 72 y.o. Caucasian male who presents for leg pain. Left knee chronic osteoarthritic pain, startinggradually getting worse 3 wks ago.  Sometimes limps due to pain. Very hard going up steps.  No preceding incident/sprain/trauma.  No knee swelling, heat, or redness. We have injected this knee before and this was very helpful. Right knee feels fine. Has some sciatica pain at night on same side when, esp when lying L side.    Past Medical History:  Diagnosis Date  . Adult ADHD    adderall caused elevated bp  . Anxiety and depression    in past/ due to back injuries  . At risk for sleep apnea    STOP-BANG= 5      SENT TO PCP 11-05-2014  . Benign paroxysmal positional vertigo   . Branch retinal artery occlusion of right eye   . Chronic pain   . Depression   . Disequilibrium syndrome    Carotid dopplers and MRI brain NORMAL x 2 (including eighth nerve studies on most recent MRI 08/2016).  . Erectile dysfunction   . Heart murmur   . History of concussion    june 2015--  hit head w/ syncope---  no residual  . History of memory loss    per prior PCP records./ per pt due to medication  . History of syncope    june 2015 --syncope w/ fall and pt states has happened 5 more times , the last one being jan 2016,,  states had battery of test and unknown cause  . Hyperlipidemia 02/2016   atorva 02/2016---lipid #s worse so changed to generic crestor.  Much improved on crestor.  . Hypertension   . IFG (impaired fasting glucose) 02/2016   HbA1c 5 %.  . Insomnia   . Lower urinary tract symptoms (LUTS)   . Mild aortic stenosis    per echo valve area 1.78cm^2  . Myalgia and myositis, unspecified   . Osteoarthritis, multiple sites    C-spine, knees, "all my joints basically"  . Prostate cancer [facility]) urologist-  dr ottelin/  oncologist-  dr Valere Dross   2016 Stage T1c,   Gleason 4+3,  PSA 5.26, vol 49.19cc  . Restless leg   . RLS (restless legs syndrome)   . Thrombocytopenia (HCC)    >100K.  Marland Kitchen Upper airway cough syndrome 2018   Dr. Halford Chessman.  Cough therapy maximized at f/u 07/2016; referred to ENT by Pulm.  Allergy testing OK. Improved as of 08/05/16 pulm f/u.  Marland Kitchen Vertebrobasilar insufficiency    per neurologist note dr Felecia Shelling-- this is possible causing syncope-- no tx just be careful getting up from sitting position  . Wears glasses     Past Surgical History:  Procedure Laterality Date  . Carotid Dopplers  08/01/2014   NORMAL (Dr. Felecia Shelling)  . CERVICAL FUSION  last one 2010   2 times  . COLONOSCOPY  02/2004  . EYE SURGERY Bilateral 2013   5 right eye, 3 left eye surgeries (including cataract extraction's iol w/ lens implant, other surgery's for post-op lens fungal infection and floaters)  . GOLD SEED IMPLANT N/A 02/22/2015   Procedure: GOLD SEED IMPLANT;  Surgeon: Kathie Rhodes, MD;  Location: [facility];  Service: Urology;  Laterality: N/A;  . LEFT THIGH QUADRICEP MUSCLE BIOPSY'S  12-31-2005  . LUMBAR  FUSION  x2  last one 2010   4 surgeries in back per pt  . MRA/MRI w/out contrast--brain  12/2014   mild, age-related chronic microvascular ischemic change (Dr. Felecia Shelling).  Marland Kitchen RADIOACTIVE SEED IMPLANT N/A 05/17/2015   Procedure: RADIOACTIVE SEED IMPLANT/BRACHYTHERAPY IMPLANT;  Surgeon: Kathie Rhodes, MD;  Location: [place];  Service: Urology;  Laterality: N/A;  . TRANSRECTAL ULTRASOUND N/A 11/12/2014   Procedure: TRANSRECTAL ULTRASOUND AND BIOPSY  ;  Surgeon: Kathie Rhodes, MD;  Location: [facility];  Service: Urology;  Laterality: N/A;  . TRANSTHORACIC ECHOCARDIOGRAM  03-29-2014   mild concentric LVH/  ef 20-94%/  grade I diastolic dysfunction/  AV poorly visualized, possibly bicupid;  mild AS,  valve area 1.78cm^2,  mean grandient 34mm Hg/  trivial TR    Outpatient Medications Prior to Visit  Medication Sig Dispense  Refill  . amphetamine-dextroamphetamine (ADDERALL XR) 30 MG 24 hr capsule 2 caps po qAM 60 capsule 0  . aspirin EC 81 MG tablet Take 81 mg by mouth daily.    Marland Kitchen b complex vitamins capsule Take 1 capsule by mouth daily.    . bisacodyl (DULCOLAX) 5 MG EC tablet Take 5 mg by mouth. Dulcolax 5 mg bowel prep #4-Take as directed    . CIALIS 5 MG tablet Take 1 tablet by mouth daily.    . clonazePAM (KLONOPIN) 0.5 MG tablet 1 tab po tid for restless legs syndrome 90 tablet 5  . cloNIDine (CATAPRES) 0.1 MG tablet TAKE 1 TAB BY MOUTH TWICE A DAY AS NEEDED BLOOD PRESSURE >170 ON TOP OR >100 ON BOTTOM 60 tablet 0  . meclizine (ANTIVERT) 25 MG tablet TAKE 1 TABLET BY MOUTH 3 TIMES DAILY AS NEEDED FOR DIZZINESS. 90 tablet 11  . Multiple Vitamin (MULTIVITAMIN) tablet Take 1 tablet by mouth daily.    . Polyethylene Glycol 3350 (MIRALAX PO) Take by mouth. Miralax 238 gm bowel prep-Take as directed    . rOPINIRole (REQUIP) 1 MG tablet Take 1 tablet (1 mg total) by mouth 3 (three) times daily. 270 tablet 3  . rosuvastatin (CRESTOR) 10 MG tablet Take 1 tablet (10 mg total) by mouth daily. 90 tablet 1   Facility-Administered Medications Prior to Visit  Medication Dose Route Frequency Provider Last Rate Last Dose  . 0.9 %  sodium chloride infusion  500 mL Intravenous Once Gatha Mayer, MD        Allergies  Allergen Reactions  . Hydrocodone Nausea Only    Causes nightmares  . Penicillins Other (See Comments)    Unknown childhood reaction    ROS As per HPI  PE: Blood pressure 132/78, pulse 71, resp. rate 16, weight 246 lb (111.6 kg), SpO2 97 %. Gen: Alert, well appearing.  Patient is oriented to person, place, time, and situation. AFFECT: pleasant, lucid thought and speech. Left knee: no signif pain with ROM, no swelling, no warmth, no erythema. Mild R peripatellar TTP and mild TTP over pes anserine bursa.  No joint line tenderness.  No LE edema. LE strength normal.  Neg patellar grind. No subluxation  of patella.  No knee instability.  LABS:    Chemistry      Component Value Date/Time   NA 144 02/26/2017 0958   K 4.2 02/26/2017 0958   CL 107 02/26/2017 0958   CO2 27 02/26/2017 0958   BUN 22 02/26/2017 0958   CREATININE 1.15 02/26/2017 0958      Component Value Date/Time   CALCIUM 9.5 02/26/2017 0958  ALKPHOS 42 02/27/2016 0851   AST 29 02/26/2017 0958   ALT 30 02/26/2017 0958   BILITOT 1.2 02/26/2017 0958     Lab Results  Component Value Date   WBC 4.1 07/22/2016   HGB 14.9 07/22/2016   HCT 45.2 07/22/2016   MCV 92.5 07/22/2016   PLT 100.0 (L) 07/22/2016    IMPRESSION AND PLAN:  Primary osteoarthritis L knee. Not improving with trial of NSAIDs. Has responded well to steroid injection in the past (3.5 mo ago). Proceed with steroid injection today.   Procedure: Therapeutic LEFT knee injection.  The patient's clinical condition is marked by substantial pain and/or significant functional disability.  Other conservative therapy has not provided relief, is contraindicated, or not appropriate.  There is a reasonable likelihood that injection will significantly improve the patient's pain and/or functional disability. Cleaned skin with alcohol swab, used anterior approach to enter knee joint, Injected 1 ml of 80mg /ml depomedrol+2  ml of 1% plain lidocaine into joint space without resistance.  No immediate complications.  Patient tolerated procedure well.  Post-injection care discussed, including 20 min of icing 1-2 times in the next 4-8 hours, frequent non weight-bearing ROM exercises over the next few days, and general pain medication management.  An After Visit Summary was printed and given to the patient.  FOLLOW UP: Return for as needed.  Signed:  Crissie Sickles, MD           05/31/2017

## 2017-06-02 ENCOUNTER — Encounter: Payer: Self-pay | Admitting: Internal Medicine

## 2017-06-02 DIAGNOSIS — Z86010 Personal history of colonic polyps: Secondary | ICD-10-CM | POA: Insufficient documentation

## 2017-06-02 NOTE — Progress Notes (Signed)
2 adenomas Recall 2024 My Chart letter

## 2017-06-06 ENCOUNTER — Encounter: Payer: Self-pay | Admitting: Family Medicine

## 2017-07-05 ENCOUNTER — Ambulatory Visit (INDEPENDENT_AMBULATORY_CARE_PROVIDER_SITE_OTHER): Payer: 59

## 2017-07-05 ENCOUNTER — Ambulatory Visit: Payer: 59 | Admitting: Family Medicine

## 2017-07-05 ENCOUNTER — Encounter: Payer: Self-pay | Admitting: Family Medicine

## 2017-07-05 VITALS — BP 160/80 | HR 82 | Temp 97.8°F | Ht 76.0 in | Wt 246.0 lb

## 2017-07-05 DIAGNOSIS — M79652 Pain in left thigh: Secondary | ICD-10-CM

## 2017-07-05 DIAGNOSIS — M5416 Radiculopathy, lumbar region: Secondary | ICD-10-CM | POA: Diagnosis not present

## 2017-07-05 MED ORDER — PREDNISONE 50 MG PO TABS: ORAL_TABLET | ORAL | 0 refills | Status: DC

## 2017-07-05 NOTE — Patient Instructions (Signed)
I think you are having nerve pain.  Please start the prednisone.  We will obtain a urgent MRI.  Please go to the emergency room if you fall again or if you have severe pain.  Please also go to the emergency room if you have weakness to the area.  TakeCare, Dr. Jerline Pain

## 2017-07-05 NOTE — Progress Notes (Signed)
    Subjective:  Andrew Stevenson is a 72 y.o. male who presents today for same-day appointment with a chief complaint of thigh pain.   HPI:  Thigh Pain, Acute Issue Patient with intermittent sharp pain in his left leg for the past 3 to 4 years that seem to mostly occur at night. This morning, he started having severe, sharp pain to the lateral aspect of his left thigh.  Pain is been intermittent in nature.  Pain is described as a severe burning sensation that can stretch all the way down into his feet.  He has never had anything like this before.  No fevers or chills.  No rash.  No obvious precipitating events.  No back pain.  Patient has fallen 4 times a day due to severe pain and his leg giving out on him.  Still has a little bit of weakness in his left leg.  ROS: Per HPI  PMH: He reports that he has never smoked. His smokeless tobacco use includes snuff. He reports that he drinks alcohol. He reports that he does not use drugs.  Objective:  Physical Exam: BP (!) 160/80 (BP Location: Right Arm)   Pulse 82   Temp 97.8 F (36.6 C) (Oral)   Ht 6\' 4"  (1.93 m)   Wt 246 lb (111.6 kg)   SpO2 98%   BMI 29.94 kg/m   Gen: NAD, resting comfortably CV: RRR with no murmurs appreciated Pulm: NWOB, CTAB with no crackles, wheezes, or rhonchi GI: Normal bowel sounds present. Soft, Nontender, Nondistended. MSK:  -Back: No deformities.  Spinous processes nontender to palpation and percussion. -Right leg: No deformities.  Strength 5 out of 5 throughout.  Sensation light touch intact throughout. -Left leg: No deformities.  Nontender to palpation.  Strength 4+ out of 5 with knee extension and flexion.  Sensation light touch intact throughout.  Neurovascularly intact distally.  Assessment/Plan:  Left thigh pain Concern for radicular pain/nerve root involvement given his distribution of sympoms. Plain film of his lumbar back today does not show any acute abnormalities and plain film of his left leg is  negative as well, based on my read.  We will await radiology read for both of these plain films. His exam today is stable-he is slightly more weak on the left compared to right, however still has good sensation and motor strength throughout.  Given his history of metastatic prostate cancer as well as his advanced age, we will obtain a stat MRI of his lumbar spine to rule out spinal cord involvement as well as nerve root impingement.  We will treat his symptoms with a 7-day course of prednisone to treat possible underlying nerve root irritation.  Strict reasons to return to care were discussed.  Also discussed reasons to go to the emergency room including development of severe weakness, pain, or recurrent falls.  May need referral to neurosurgery or neurology depending on results of MRI.  Elevated Blood Pressure Reading Elevated today in setting of acute illness.  Previously well controlled.  Will not make any adjustments today.  Has PCP appointment follow-up scheduled in approximately 6 weeks.  Algis Greenhouse. Jerline Pain, MD 07/05/2017 4:13 PM

## 2017-07-08 ENCOUNTER — Ambulatory Visit: Payer: 59 | Admitting: Family Medicine

## 2017-07-08 ENCOUNTER — Ambulatory Visit: Payer: Self-pay | Admitting: Hematology

## 2017-07-08 ENCOUNTER — Encounter: Payer: Self-pay | Admitting: Family Medicine

## 2017-07-08 VITALS — BP 143/79 | HR 89 | Temp 97.7°F | Resp 16 | Ht 76.0 in | Wt 246.0 lb

## 2017-07-08 DIAGNOSIS — R1032 Left lower quadrant pain: Secondary | ICD-10-CM

## 2017-07-08 DIAGNOSIS — M79652 Pain in left thigh: Secondary | ICD-10-CM | POA: Diagnosis not present

## 2017-07-08 DIAGNOSIS — M5416 Radiculopathy, lumbar region: Secondary | ICD-10-CM | POA: Diagnosis not present

## 2017-07-08 DIAGNOSIS — M791 Myalgia, unspecified site: Secondary | ICD-10-CM | POA: Diagnosis not present

## 2017-07-08 DIAGNOSIS — Z8546 Personal history of malignant neoplasm of prostate: Secondary | ICD-10-CM | POA: Diagnosis not present

## 2017-07-08 DIAGNOSIS — M25512 Pain in left shoulder: Secondary | ICD-10-CM

## 2017-07-08 DIAGNOSIS — M7522 Bicipital tendinitis, left shoulder: Secondary | ICD-10-CM | POA: Diagnosis not present

## 2017-07-08 DIAGNOSIS — R2689 Other abnormalities of gait and mobility: Secondary | ICD-10-CM

## 2017-07-08 LAB — BASIC METABOLIC PANEL
BUN: 28 mg/dL — ABNORMAL HIGH (ref 6–23)
CO2: 26 mEq/L (ref 19–32)
Calcium: 9.2 mg/dL (ref 8.4–10.5)
Chloride: 107 mEq/L (ref 96–112)
Creatinine, Ser: 0.88 mg/dL (ref 0.40–1.50)
GFR: 90.17 mL/min (ref >60.00)
Glucose, Bld: 135 mg/dL — ABNORMAL HIGH (ref 70–99)
Potassium: 4.5 mEq/L (ref 3.5–5.1)
Sodium: 142 mEq/L (ref 135–145)

## 2017-07-08 LAB — CK: Total CK: 213 U/L (ref 7–232)

## 2017-07-08 LAB — PSA: PSA: 0.08 ng/mL — ABNORMAL LOW (ref 0.10–4.00)

## 2017-07-08 MED ORDER — OXYCODONE HCL 5 MG PO TABS: ORAL_TABLET | ORAL | 0 refills | Status: DC

## 2017-07-08 NOTE — Telephone Encounter (Signed)
Noted  

## 2017-07-08 NOTE — Telephone Encounter (Signed)
Patient states he has been dealing with pain in his left leg for several months.  On Monday 07/05/2017 the pain became worse.  Patient fell x4 on Monday.  Went to Dr. Idelle Leech office  And was seen by Dr. Jerline Pain and x-rays were ordered on Monday.  Per patient nothing was found.  Patient states that today the pain is moving up left side to arm and shoulder.  Patient denies chest pain.  Patient states he has difficulty holding the left arm up.  Patient describes the pain as feeling like "fire".  Patient states if leg is straight, the pain is at 1, if he moves it the pain goes up to greater than 10.  Patient did ask for instructions to [employer].  I gave him the address and did ask if he was going to the ED or going to the appointment.  Patient states he will keep the appointment today.  Patient is interested in having an MRI to check for pinched nerve.  Appointment scheduled today at 1:00 with Dr. Anitra Lauth. Reason for Disposition . [1] High-risk adult (e.g., age > 49, osteoporosis, chronic steroid use) AND [2] limping  Answer Assessment - Initial Assessment Questions 1. MECHANISM: "How did the injury happen?" (e.g., twisting injury, direct blow)      Several months ago, but recent injury on Monday 2. ONSET: "When did the injury happen?" (Minutes or hours ago)   got worse on Monday 3. LOCATION: "Where is the injury located?"    Left leg, left side and left arm  5. SEVERITY: "Can you put weight on that leg?" "Can you walk?"     Has fallen several times, and can not put a lot of weight on the leg  7. PAIN: "Is there pain?" If so, ask: "How bad is the pain?"  (Scale 1-10; or mild, moderate, severe)     1- while sitting, with movement is beyond 10  9. OTHER SYMPTOMS: "Do you have any other symptoms?"     Constant pain to left arm and shoulder area.  Protocols used: LEG INJURY-A-AH

## 2017-07-08 NOTE — Progress Notes (Addendum)
OFFICE VISIT  07/08/2017   CC:  Chief Complaint  Patient presents with  . Leg Pain    left   HPI:    Patient is a 72 y.o. Caucasian male who presents for leg, left side, and left shoulder pain, recent falls.  Has been feeling soreness in L thigh last few months, progressively getting a bit worse. Then, four days ago he was walking and turned his leg while getting into truck and felt very severe shooting pain from L groin into L leg all the way down into foot.  +Recurrent episodes frequently since that time.  +Paresthesias in same distribution. The pain is mild if sitting still, severe if moves L hip/leg.  Since yesterday, if he moves L arm or left leg at all the severe pain returns.  Left arm and side pain caused some relative weakness in left arm this morning, but this has progressively improved throughout the day. Says he had his left leg "give way" 4 times yesterday and each time he caught himself while falling over forward, denies any injury sustained.  Says he has never had this kind of thing before.  Of note, he was seen 3 d/a by Dr. Jerline Pain with [facility].  Concern was noted about possible L spinal nerve impingement, esp given L leg weakness in addition to the pain.  He was placed on prednisone and an urgent L spine MRI was ordered.  Pt has not been contacted about getting this set up. Plain films of left femur and L spine 3 d/a showed no acute abnormalities.    ROS: no changes in bowel or bladder control.  No saddle anesthesia.  He says he cannot bear wt on L leg more than 20% or so w/out extreme pain that causes L leg to buckle. No dysphagia, no slurring of speech, no vision abnormalities.  B/B control intact.  No neck pain, no HA. No rash, no melena or hematochezia, no polyuria/polydipsia.  No palpitations, no dizziness or vertigo, no CP, no SOB, no LL swelling or color changes.  LEFT FEMUR 2 VIEWS 07/05/17  COMPARISON:  None.  FINDINGS: No acute bony abnormality.  Specifically, no fracture, subluxation, or dislocation. Mild degenerative changes in the left hip and knee. No joint effusion within the left knee. Radiation seeds in the region of the prostate.  IMPRESSION: No acute bony abnormality.   LUMBAR SPINE - 2-3 VIEW 07/05/17  COMPARISON:  CT 03/28/2014.  FINDINGS: Lumbar spine numbered as per prior CT. L4 through S1 posterior interbody fusion. Hardware intact. Anatomic alignment. No acute bony abnormality. Diffuse degenerative change. Aortic atherosclerotic vascular calcification. Surgical clips in the pelvis.  IMPRESSION: 1. L4 through S1 posterior interbody fusion. Anatomic alignment. Hardware intact. No acute abnormality. Diffuse degenerative change. 2.  Aortoiliac atherosclerotic vascular disease.  Past Medical History:  Diagnosis Date  . Adult ADHD    adderall caused elevated bp  . Anxiety and depression    in past/ due to back injuries  . At risk for sleep apnea    STOP-BANG= 5      SENT TO PCP 11-05-2014  . Benign paroxysmal positional vertigo   . Branch retinal artery occlusion of right eye   . Chronic pain   . Depression   . Disequilibrium syndrome    Carotid dopplers and MRI brain NORMAL x 2 (including eighth nerve studies on most recent MRI 08/2016).  . Erectile dysfunction   . Heart murmur   . History of concussion    june 2015--  hit head w/ syncope---  no residual  . History of memory loss    per prior PCP records./ per pt due to medication  . History of syncope    june 2015 --syncope w/ fall and pt states has happened 5 more times , the last one being jan 2016,,  states had battery of test and unknown cause  . Hx of adenomatous colonic polyps 05/25/2017   Recall 05/2022  . Hyperlipidemia 02/2016   atorva 02/2016---lipid #s worse so changed to generic crestor.  Much improved on crestor.  . Hypertension   . IFG (impaired fasting glucose) 02/2016   HbA1c 5 %.  . Insomnia   . Lower urinary tract symptoms  (LUTS)   . Mild aortic stenosis    per echo valve area 1.78cm^2  . Myalgia and myositis, unspecified   . Osteoarthritis, multiple sites    C-spine, knees, "all my joints basically"  . Prostate cancer [facility]) urologist-  dr ottelin/  oncologist-  dr Valere Dross   2016 Stage T1c,  Gleason 4+3,  PSA 5.26, vol 49.19cc  . Restless leg   . RLS (restless legs syndrome)   . Thrombocytopenia (HCC)    >100K.  Marland Kitchen Upper airway cough syndrome 2018   Dr. Halford Chessman.  Cough therapy maximized at f/u 07/2016; referred to ENT by Pulm.  Allergy testing OK. Improved as of 08/05/16 pulm f/u.  Marland Kitchen Vertebrobasilar insufficiency    per neurologist note dr Felecia Shelling-- this is possible causing syncope-- no tx just be careful getting up from sitting position  . Wears glasses     Past Surgical History:  Procedure Laterality Date  . Carotid Dopplers  08/01/2014   NORMAL (Dr. Felecia Shelling)  . CERVICAL FUSION  last one 2010   2 times  . COLONOSCOPY  02/2004; 05/2017   2019: adenomatous polyp, int hem, diverticulosis, radiation proctitis.  Recall 05/2022.  Marland Kitchen EYE SURGERY Bilateral 2013   5 right eye, 3 left eye surgeries (including cataract extraction's iol w/ lens implant, other surgery's for post-op lens fungal infection and floaters)  . GOLD SEED IMPLANT N/A 02/22/2015   Procedure: GOLD SEED IMPLANT;  Surgeon: Kathie Rhodes, MD;  Location: [facility]  Service: Urology;  Laterality: N/A;  . LEFT THIGH QUADRICEP MUSCLE BIOPSY'S  12-31-2005  . LUMBAR FUSION  x2  last one 2010   4 surgeries in back per pt  . MRA/MRI w/out contrast--brain  12/2014   mild, age-related chronic microvascular ischemic change (Dr. Felecia Shelling).  Marland Kitchen RADIOACTIVE SEED IMPLANT N/A 05/17/2015   Procedure: RADIOACTIVE SEED IMPLANT/BRACHYTHERAPY IMPLANT;  Surgeon: Kathie Rhodes, MD;  Location: [facility];  Service: Urology;  Laterality: N/A;  . TRANSRECTAL ULTRASOUND N/A 11/12/2014   Procedure: TRANSRECTAL ULTRASOUND AND BIOPSY  ;  Surgeon: Kathie Rhodes, MD;  Location: [facility];  Service: Urology;  Laterality: N/A;  . TRANSTHORACIC ECHOCARDIOGRAM  03-29-2014   mild concentric LVH/  ef 77-82%/  grade I diastolic dysfunction/  AV poorly visualized, possibly bicupid;  mild AS,  valve area 1.78cm^2,  mean grandient 15mm Hg/  trivial TR    Outpatient Medications Prior to Visit  Medication Sig Dispense Refill  . amphetamine-dextroamphetamine (ADDERALL XR) 30 MG 24 hr capsule 2 caps po qAM 60 capsule 0  . aspirin EC 81 MG tablet Take 81 mg by mouth daily.    Marland Kitchen b complex vitamins capsule Take 1 capsule by mouth daily.    . bisacodyl (DULCOLAX) 5 MG EC tablet Take 5 mg  by mouth. Dulcolax 5 mg bowel prep #4-Take as directed    . CIALIS 5 MG tablet Take 1 tablet by mouth daily.    . clonazePAM (KLONOPIN) 0.5 MG tablet 1 tab po tid for restless legs syndrome 90 tablet 5  . meclizine (ANTIVERT) 25 MG tablet TAKE 1 TABLET BY MOUTH 3 TIMES DAILY AS NEEDED FOR DIZZINESS. 90 tablet 11  . Multiple Vitamin (MULTIVITAMIN) tablet Take 1 tablet by mouth daily.    . Polyethylene Glycol 3350 (MIRALAX PO) Take by mouth. Miralax 238 gm bowel prep-Take as directed    . predniSONE (DELTASONE) 50 MG tablet Take 1 tablet daily for 5 days. Then 1/2 tab daily for 2 days. 6 tablet 0  . rOPINIRole (REQUIP) 1 MG tablet Take 1 tablet (1 mg total) by mouth 3 (three) times daily. 270 tablet 3  . rosuvastatin (CRESTOR) 10 MG tablet Take 1 tablet (10 mg total) by mouth daily. 90 tablet 1  . 0.9 %  sodium chloride infusion      No facility-administered medications prior to visit.     Allergies  Allergen Reactions  . Hydrocodone Nausea Only    Causes nightmares  . Penicillins Other (See Comments)    Unknown childhood reaction    ROS As per HPI  PE: Blood pressure (!) 143/79, pulse 89, temperature 97.7 F (36.5 C), temperature source Oral, resp. rate 16, height 6\' 4"  (1.93 m), weight 246 lb (111.6 kg), SpO2 96 %. Gen: Alert, well appearing.   Patient is oriented to person, place, time, and situation. AFFECT: pleasant, lucid thought and speech. HQI:ONGE: no injection, icteris, swelling, or exudate.  EOMI, PERRLA. Mouth: lips without lesion/swelling.  Oral mucosa pink and moist. Oropharynx without erythema, exudate, or swelling.  Neck - No masses or thyromegaly or limitation in range of motion CV: RRR, no m/r/g.   LUNGS: CTA bilat, nonlabored resps, good aeration in all lung fields. Neuro: CN 2-12 intact bilaterally, strength 5/5 in proximal and distal upper extremities and lower extremities bilaterally.  No sensory deficits.  No tremor. Pt unable to ambulate sufficiently to assess.    No pronator drift. He has mild TTP in L anterior upper thigh and over proximal lateral L thigh.  No low back or glut tenderness. Hip: pain elicited in L upper thigh when full L hip ER is reached, otherwise normal ROM L hip w/out pain. Mild diffuse L thigh TTP (minimal). Neck w/out TTP.  No TTP over traps or shoulder girdle or clavicles.  Distal left biceps tendon tender on left, with equivocal speeds test and + yergason's.   The Left shoulder ROM is intact w/out pain except for the very fullest extent of ER---this causes pain in distal biceps tendon region. Skin - no sores or suspicious lesions or rashes or color changes   LABS:    Chemistry      Component Value Date/Time   NA 144 02/26/2017 0958   K 4.2 02/26/2017 0958   CL 107 02/26/2017 0958   CO2 27 02/26/2017 0958   BUN 22 02/26/2017 0958   CREATININE 1.15 02/26/2017 0958      Component Value Date/Time   CALCIUM 9.5 02/26/2017 0958   ALKPHOS 42 02/27/2016 0851   AST 29 02/26/2017 0958   ALT 30 02/26/2017 0958   BILITOT 1.2 02/26/2017 0958     Lab Results  Component Value Date   WBC 4.1 07/22/2016   HGB 14.9 07/22/2016   HCT 45.2 07/22/2016   MCV 92.5 07/22/2016  PLT 100.0 (L) 07/22/2016    IMPRESSION AND PLAN:  1) Acute, severe, left leg radiculopathy pain that is  debilitating--patient unable to bear much wt on the L leg and cannot walk due to severity of pain. Hx of prostate ca treated.  2) Question left bicipital tendonitis on exam: possibly injury from recent forward fall. No sign of RC or bony shoulder pathology.  He is n/v intact.  Check CPK, BMET, PSA. Obtain urgent MRI L spine to assess for lumbar spinal nerve impingment, disruption of his lumbar fusion, or spinal stenosis/cord compression.  Oxycodone 5mg , 1-2 q6h prn pain, #30. Continue prednisone rx'd 3 d/a.  An After Visit Summary was printed and given to the patient.  FOLLOW UP: Return in about 5 days (around 07/13/2017).  Signed:  Crissie Sickles, MD           07/08/2017

## 2017-07-10 ENCOUNTER — Ambulatory Visit (HOSPITAL_BASED_OUTPATIENT_CLINIC_OR_DEPARTMENT_OTHER)
Admission: RE | Admit: 2017-07-10 | Discharge: 2017-07-10 | Disposition: A | Discharge status: Home / Self Care | Payer: 59 | Source: Ambulatory Visit | Attending: Family Medicine | Admitting: Family Medicine

## 2017-07-10 DIAGNOSIS — R2689 Other abnormalities of gait and mobility: Secondary | ICD-10-CM

## 2017-07-10 DIAGNOSIS — Z8546 Personal history of malignant neoplasm of prostate: Secondary | ICD-10-CM | POA: Diagnosis present

## 2017-07-10 DIAGNOSIS — M48061 Spinal stenosis, lumbar region without neurogenic claudication: Secondary | ICD-10-CM | POA: Diagnosis not present

## 2017-07-10 DIAGNOSIS — Z9889 Other specified postprocedural states: Secondary | ICD-10-CM | POA: Diagnosis not present

## 2017-07-10 DIAGNOSIS — M5416 Radiculopathy, lumbar region: Secondary | ICD-10-CM

## 2017-07-10 DIAGNOSIS — M5126 Other intervertebral disc displacement, lumbar region: Secondary | ICD-10-CM | POA: Diagnosis not present

## 2017-07-10 DIAGNOSIS — M5136 Other intervertebral disc degeneration, lumbar region: Secondary | ICD-10-CM | POA: Diagnosis not present

## 2017-07-12 ENCOUNTER — Other Ambulatory Visit: Payer: Self-pay | Admitting: Family Medicine

## 2017-07-12 DIAGNOSIS — M5442 Lumbago with sciatica, left side: Secondary | ICD-10-CM

## 2017-07-12 DIAGNOSIS — Z981 Arthrodesis status: Secondary | ICD-10-CM

## 2017-07-12 DIAGNOSIS — M47816 Spondylosis without myelopathy or radiculopathy, lumbar region: Secondary | ICD-10-CM

## 2017-07-14 ENCOUNTER — Other Ambulatory Visit: Payer: Self-pay | Admitting: Family Medicine

## 2017-07-14 MED ORDER — AMPHETAMINE-DEXTROAMPHET ER 30 MG PO CP24: ORAL_CAPSULE | ORAL | 0 refills | Status: DC

## 2017-07-14 NOTE — Telephone Encounter (Signed)
Rx's put up front for p/u. Pt advised and voiced understanding.

## 2017-07-14 NOTE — Telephone Encounter (Signed)
Copied from [place] [id number]. Topic: Quick Communication - Rx Refill/Question >> Jul 14, 2017  9:26 AM Margot Ables wrote: Medication: amphetamine-dextroamphetamine (ADDERALL XR) 30 MG 24 hr capsule  - pt states he will be out Saturday. He is going out of town Monday Has the patient contacted their pharmacy? No - controlled Preferred Pharmacy (with phone number or street name): [id number] - [place], [address] [phone] (Phone) [phone] (Fax)

## 2017-07-14 NOTE — Telephone Encounter (Signed)
RF request for adderall xr LOV: 02/26/17 Next ov: 08/26/17 Last written: 02/26/17 #60 w/ Marcelyn Ditty (3Rx's given one for Dec, Jan, and Feb)  Please advise. Thanks.

## 2017-08-25 ENCOUNTER — Encounter: Payer: Self-pay | Admitting: *Deleted

## 2017-08-26 ENCOUNTER — Ambulatory Visit: Payer: 59 | Admitting: Family Medicine

## 2017-08-26 ENCOUNTER — Encounter: Payer: Self-pay | Admitting: Family Medicine

## 2017-08-26 NOTE — Progress Notes (Deleted)
OFFICE VISIT  08/26/2017   CC: No chief complaint on file.  HPI:    Patient is a 72 y.o. Caucasian male who presents for 6 mo f/u HTN, HLD, chronic osteoarthritic pain syndrome (knees and LB).  I saw him 07/10/17 for left leg pain c/w radiculopathy, L leg weak, MRI spine showed possible impingement of L3 nerve root (either side), referred him to Dr. Joya Salm.   Past Medical History:  Diagnosis Date  . Adult ADHD    adderall caused elevated bp  . Anxiety and depression    in past/ due to back injuries  . At risk for sleep apnea    STOP-BANG= 5      SENT TO PCP 11-05-2014  . Benign paroxysmal positional vertigo   . Branch retinal artery occlusion of right eye   . Chronic pain   . Depression   . Disequilibrium syndrome    Carotid dopplers and MRI brain NORMAL x 2 (including eighth nerve studies on most recent MRI 08/2016).  . Erectile dysfunction   . Heart murmur   . History of concussion    june 2015--  hit head w/ syncope---  no residual  . History of memory loss    per prior PCP records./ per pt due to medication  . History of syncope    june 2015 --syncope w/ fall and pt states has happened 5 more times , the last one being jan 2016,,  states had battery of test and unknown cause  . Hx of adenomatous colonic polyps 05/25/2017   Recall 05/2022  . Hyperlipidemia 02/2016   atorva 02/2016---lipid #s worse so changed to generic crestor.  Much improved on crestor.  . Hypertension   . IFG (impaired fasting glucose) 02/2016   HbA1c 5 %.  . Insomnia   . Lower urinary tract symptoms (LUTS)   . Mild aortic stenosis    per echo valve area 1.78cm^2  . Myalgia and myositis, unspecified   . Osteoarthritis, multiple sites    C-spine, knees, "all my joints basically"  . Prostate cancer [facility]) urologist-  dr ottelin/  oncologist-  dr Valere Dross   2016 Stage T1c,  Gleason 4+3,  PSA 5.26, vol 49.19cc  . Restless leg   . RLS (restless legs syndrome)   . Thrombocytopenia (HCC)    >100K.  Marland Kitchen  Upper airway cough syndrome 2018   Dr. Halford Chessman.  Cough therapy maximized at f/u 07/2016; referred to ENT by Pulm.  Allergy testing OK. Improved as of 08/05/16 pulm f/u.  Marland Kitchen Vertebrobasilar insufficiency    per neurologist note dr Felecia Shelling-- this is possible causing syncope-- no tx just be careful getting up from sitting position  . Wears glasses     Past Surgical History:  Procedure Laterality Date  . Carotid Dopplers  08/01/2014   NORMAL (Dr. Felecia Shelling)  . CERVICAL FUSION  last one 2010   2 times  . COLONOSCOPY  02/2004; 05/2017   2019: adenomatous polyp, int hem, diverticulosis, radiation proctitis.  Recall 05/2022.  Marland Kitchen EYE SURGERY Bilateral 2013   5 right eye, 3 left eye surgeries (including cataract extraction's iol w/ lens implant, other surgery's for post-op lens fungal infection and floaters)  . GOLD SEED IMPLANT N/A 02/22/2015   Procedure: GOLD SEED IMPLANT;  Surgeon: Kathie Rhodes, MD;  Location: [facility];  Service: Urology;  Laterality: N/A;  . LEFT THIGH QUADRICEP MUSCLE BIOPSY'S  12-31-2005  . LUMBAR FUSION  x2  last one 2010   4 surgeries in  back per pt  . MRA/MRI w/out contrast--brain  12/2014   mild, age-related chronic microvascular ischemic change (Dr. Felecia Shelling).  Marland Kitchen RADIOACTIVE SEED IMPLANT N/A 05/17/2015   Procedure: RADIOACTIVE SEED IMPLANT/BRACHYTHERAPY IMPLANT;  Surgeon: Kathie Rhodes, MD;  Location: [place];  Service: Urology;  Laterality: N/A;  . TRANSRECTAL ULTRASOUND N/A 11/12/2014   Procedure: TRANSRECTAL ULTRASOUND AND BIOPSY  ;  Surgeon: Kathie Rhodes, MD;  Location: [facility];  Service: Urology;  Laterality: N/A;  . TRANSTHORACIC ECHOCARDIOGRAM  03-29-2014   mild concentric LVH/  ef 38-18%/  grade I diastolic dysfunction/  AV poorly visualized, possibly bicupid;  mild AS,  valve area 1.78cm^2,  mean grandient 46mm Hg/  trivial TR    Outpatient Medications Prior to Visit  Medication Sig Dispense Refill  .  amphetamine-dextroamphetamine (ADDERALL XR) 30 MG 24 hr capsule 2 caps po qAM 60 capsule 0  . aspirin EC 81 MG tablet Take 81 mg by mouth daily.    Marland Kitchen b complex vitamins capsule Take 1 capsule by mouth daily.    . bisacodyl (DULCOLAX) 5 MG EC tablet Take 5 mg by mouth. Dulcolax 5 mg bowel prep #4-Take as directed    . CIALIS 5 MG tablet Take 1 tablet by mouth daily.    . clonazePAM (KLONOPIN) 0.5 MG tablet 1 tab po tid for restless legs syndrome 90 tablet 5  . meclizine (ANTIVERT) 25 MG tablet TAKE 1 TABLET BY MOUTH 3 TIMES DAILY AS NEEDED FOR DIZZINESS. 90 tablet 11  . Multiple Vitamin (MULTIVITAMIN) tablet Take 1 tablet by mouth daily.    Marland Kitchen oxyCODONE (OXY IR/ROXICODONE) 5 MG immediate release tablet 1-2 tabs po q6h prn pain 30 tablet 0  . Polyethylene Glycol 3350 (MIRALAX PO) Take by mouth. Miralax 238 gm bowel prep-Take as directed    . predniSONE (DELTASONE) 50 MG tablet Take 1 tablet daily for 5 days. Then 1/2 tab daily for 2 days. 6 tablet 0  . rOPINIRole (REQUIP) 1 MG tablet Take 1 tablet (1 mg total) by mouth 3 (three) times daily. 270 tablet 3  . rosuvastatin (CRESTOR) 10 MG tablet Take 1 tablet (10 mg total) by mouth daily. 90 tablet 1   No facility-administered medications prior to visit.     Allergies  Allergen Reactions  . Hydrocodone Nausea Only    Causes nightmares  . Penicillins Other (See Comments)    Unknown childhood reaction    ROS As per HPI  PE: There were no vitals taken for this visit. ***  LABS:  Lab Results  Component Value Date   TSH 2.00 02/27/2016   Lab Results  Component Value Date   WBC 4.1 07/22/2016   HGB 14.9 07/22/2016   HCT 45.2 07/22/2016   MCV 92.5 07/22/2016   PLT 100.0 (L) 07/22/2016   Lab Results  Component Value Date   CREATININE 0.88 07/08/2017   BUN 28 (H) 07/08/2017   NA 142 07/08/2017   K 4.5 07/08/2017   CL 107 07/08/2017   CO2 26 07/08/2017   Lab Results  Component Value Date   ALT 30 02/26/2017   AST 29  02/26/2017   ALKPHOS 42 02/27/2016   BILITOT 1.2 02/26/2017   Lab Results  Component Value Date   CHOL 116 11/17/2016   Lab Results  Component Value Date   HDL 41 11/17/2016   Lab Results  Component Value Date   LDLCALC 55 11/17/2016   Lab Results  Component Value Date   TRIG 102  11/17/2016   Lab Results  Component Value Date   CHOLHDL 2.8 11/17/2016   Lab Results  Component Value Date   PSA 0.08 (L) 07/08/2017   Lab Results  Component Value Date   HGBA1C 5.0 02/27/2016    IMPRESSION AND PLAN:  No problem-specific Assessment & Plan notes found for this encounter.   FOLLOW UP: No follow-ups on file.

## 2017-10-24 ENCOUNTER — Other Ambulatory Visit: Payer: Self-pay | Admitting: Family Medicine

## 2017-11-11 ENCOUNTER — Encounter: Payer: Self-pay | Admitting: Family Medicine

## 2017-11-11 ENCOUNTER — Ambulatory Visit: Payer: 59 | Admitting: Family Medicine

## 2017-11-11 VITALS — BP 162/83 | HR 91 | Temp 98.3°F | Resp 16 | Ht 76.0 in | Wt 239.0 lb

## 2017-11-11 DIAGNOSIS — I10 Essential (primary) hypertension: Secondary | ICD-10-CM | POA: Diagnosis not present

## 2017-11-11 DIAGNOSIS — E663 Overweight: Secondary | ICD-10-CM

## 2017-11-11 DIAGNOSIS — Z01818 Encounter for other preprocedural examination: Secondary | ICD-10-CM

## 2017-11-11 NOTE — Progress Notes (Signed)
Office Note 11/11/2017  CC:  Chief Complaint  Patient presents with  . Medical Clearance    HPI:  Andrew [facility] is a 72 y.o. White male who is here for pre-operative medical clearance for surgery on posterior spinal fusion with TLIF with extension with adjacent levels L2-3.  This will be low back operation #3. Surgery is scheduled for 11/16/17 with Dr. Patrice Paradise at [facility].  No acute complaints other than back pain. Home bp monitoring consistently <130/80--> on NO bp meds.  Pt has no hx of significant cardiac valvular stenosis or regurgitation. He does not have ICD/Pacer.  No history of CHF, MI, PE, or severe systemic disease. He does have dx of hypercholesterolemia.  He has never smoked.  No prediabetes/diabetes. History of mild thrombocytopenia that has been stable over the last 4 yrs. Has hx of sinus bradycardia when on a beta blocker---resolved when he got off the beta blocker.   No hx of abnormal EKG or CXR.  Functional status: walks up stairs w/out any CP or DOE.  He really doesn't try any yard work or vigorous physical exercise b/c his back pain limits his activity significantly.  No palpitations.  No dizziness outside of brief/intermittent episodes of positional vertigo.    He does snore but says his wife has not noted any apneic events in sleep.  No excessive daytime somnolence. Anesthesia hx: no hx of any problems with anesthesia for surgeries in the past.  He no longer takes ASA for primary CV/CV dz prevention--we decided this after a discussion about the latest guidelines that state that ASA's use for primary prevention is no longer indicated..  Past Medical History:  Diagnosis Date  . Adult ADHD    adderall caused elevated bp  . Anxiety and depression    in past/ due to back injuries  . At risk for sleep apnea    STOP-BANG= 5      SENT TO PCP 11-05-2014  . Benign paroxysmal positional vertigo   . Branch retinal artery occlusion of right eye   .  Chronic pain   . Depression   . Disequilibrium syndrome    Carotid dopplers and MRI brain NORMAL x 2 (including eighth nerve studies on most recent MRI 08/2016).  . Erectile dysfunction   . Heart murmur   . History of concussion    june 2015--  hit head w/ syncope---  no residual  . History of memory loss    per prior PCP records./ per pt due to medication  . History of syncope    june 2015 --syncope w/ fall and pt states has happened 5 more times , the last one being jan 2016,,  states had battery of test and unknown cause  . Hx of adenomatous colonic polyps 05/25/2017   Recall 05/2022  . Hyperlipidemia 02/2016   atorva 02/2016---lipid #s worse so changed to generic crestor.  Much improved on crestor.  . IFG (impaired fasting glucose) 02/2016   HbA1c 5 %.  . Insomnia   . Lower urinary tract symptoms (LUTS)   . Mild aortic stenosis    per echo valve area 1.78cm^2  . Myalgia and myositis, unspecified   . Osteoarthritis, multiple sites    C-spine, L spine, knees, "all my joints basically"  . Prostate cancer [facility]) urologist-  dr ottelin/  oncologist-  dr Valere Dross   2016 Stage T1c,  Gleason 4+3,  PSA 5.26, vol 49.19cc  . RLS (restless legs syndrome)   . Thrombocytopenia (HCC)    >  100K.  . Upper airway cough syndrome 2018   Dr. Halford Chessman.  Cough therapy maximized at f/u 07/2016; referred to ENT by Pulm.  Allergy testing OK. Improved as of 08/05/16 pulm f/u.  Marland Kitchen Vertebrobasilar insufficiency    per neurologist note dr Felecia Shelling-- this is possible causing syncope-- no tx just be careful getting up from sitting position  . Wears glasses   . White coat syndrome without hypertension     Past Surgical History:  Procedure Laterality Date  . Carotid Dopplers  08/01/2014   NORMAL (Dr. Felecia Shelling)  . CERVICAL FUSION  last one 2010   2 times  . COLONOSCOPY  02/2004; 05/2017   2019: adenomatous polyp, int hem, diverticulosis, radiation proctitis.  Recall 05/2022.  Marland Kitchen EYE SURGERY Bilateral 2013   5 right eye, 3  left eye surgeries (including cataract extraction's iol w/ lens implant, other surgery's for post-op lens fungal infection and floaters)  . GOLD SEED IMPLANT N/A 02/22/2015   Procedure: GOLD SEED IMPLANT;  Surgeon: Kathie Rhodes, MD;  Location: [facility];  Service: Urology;  Laterality: N/A;  . LEFT THIGH QUADRICEP MUSCLE BIOPSY'S  12-31-2005  . LUMBAR FUSION  x2  last one 2010   4 surgeries in back per pt  . MRA/MRI w/out contrast--brain  12/2014   mild, age-related chronic microvascular ischemic change (Dr. Felecia Shelling).  Marland Kitchen RADIOACTIVE SEED IMPLANT N/A 05/17/2015   Procedure: RADIOACTIVE SEED IMPLANT/BRACHYTHERAPY IMPLANT;  Surgeon: Kathie Rhodes, MD;  Location: [place];  Service: Urology;  Laterality: N/A;  . TRANSRECTAL ULTRASOUND N/A 11/12/2014   Procedure: TRANSRECTAL ULTRASOUND AND BIOPSY  ;  Surgeon: Kathie Rhodes, MD;  Location: [facility];  Service: Urology;  Laterality: N/A;  . TRANSTHORACIC ECHOCARDIOGRAM  03-29-2014   mild concentric LVH/  ef 67-59%/  grade I diastolic dysfunction/  AV poorly visualized, possibly bicupid;  mild AS,  valve area 1.78cm^2,  mean grandient 68mm Hg/  trivial TR    Family History  Problem Relation Age of Onset  . Hypertension Mother   . Alcohol abuse Father   . Arthritis Father   . Lung cancer Father        smoker  . Diabetes Brother   . Alcohol abuse Maternal Grandfather   . Stroke Maternal Grandfather   . Cancer Paternal Grandmother   . Alcohol abuse Paternal Grandfather   . Lung cancer Brother        smoker    Social History   Socioeconomic History  . Marital status: Married    Spouse name: Not on file  . Number of children: Not on file  . Years of education: Not on file  . Highest education level: Not on file  Occupational History  . Occupation: UNEMPLOYED  Social Needs  . Financial resource strain: Not on file  . Food insecurity:    Worry: Not on file    Inability: Not on file  .  Transportation needs:    Medical: Not on file    Non-medical: Not on file  Tobacco Use  . Smoking status: Never Smoker  . Smokeless tobacco: Current User    Types: Snuff  . Tobacco comment: using snuff x 25 years  Substance and Sexual Activity  . Alcohol use: Yes    Alcohol/week: 0.0 standard drinks    Comment: rare  . Drug use: No  . Sexual activity: Not on file  Lifestyle  . Physical activity:    Days per week: Not on file  Minutes per session: Not on file  . Stress: Not on file  Relationships  . Social connections:    Talks on phone: Not on file    Gets together: Not on file    Attends religious service: Not on file    Active member of club or organization: Not on file    Attends meetings of clubs or organizations: Not on file    Relationship status: Not on file  . Intimate partner violence:    Fear of current or ex partner: Not on file    Emotionally abused: Not on file    Physically abused: Not on file    Forced sexual activity: Not on file  Other Topics Concern  . Not on file  Social History Narrative   Married, 1 daughter.   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   No tob.   Occ alcohol.    Outpatient Medications Prior to Visit  Medication Sig Dispense Refill  . amphetamine-dextroamphetamine (ADDERALL XR) 30 MG 24 hr capsule 2 caps po qAM 60 capsule 0  . b complex vitamins capsule Take 1 capsule by mouth daily.    Marland Kitchen CIALIS 5 MG tablet Take 1 tablet by mouth daily.    . clonazePAM (KLONOPIN) 0.5 MG tablet 1 tab po tid for restless legs syndrome 90 tablet 5  . meclizine (ANTIVERT) 25 MG tablet TAKE 1 TABLET BY MOUTH 3 TIMES DAILY AS NEEDED FOR DIZZINESS. 90 tablet 11  . Multiple Vitamin (MULTIVITAMIN) tablet Take 1 tablet by mouth daily.    Marland Kitchen rOPINIRole (REQUIP) 1 MG tablet Take 1 tablet (1 mg total) by mouth 3 (three) times daily. 270 tablet 3  . rosuvastatin (CRESTOR) 10 MG tablet TAKE 1 TABLET BY MOUTH EVERY DAY 90  tablet 1  . aspirin EC 81 MG tablet Take 81 mg by mouth daily.    . bisacodyl (DULCOLAX) 5 MG EC tablet Take 5 mg by mouth. Dulcolax 5 mg bowel prep #4-Take as directed    . oxyCODONE (OXY IR/ROXICODONE) 5 MG immediate release tablet 1-2 tabs po q6h prn pain (Patient not taking: Reported on 11/11/2017) 30 tablet 0  . Polyethylene Glycol 3350 (MIRALAX PO) Take by mouth. Miralax 238 gm bowel prep-Take as directed    . predniSONE (DELTASONE) 50 MG tablet Take 1 tablet daily for 5 days. Then 1/2 tab daily for 2 days. (Patient not taking: Reported on 11/11/2017) 6 tablet 0   No facility-administered medications prior to visit.     Allergies  Allergen Reactions  . Hydrocodone Nausea Only    Causes nightmares  . Penicillins Other (See Comments)    Unknown childhood reaction    ROS Review of Systems  Constitutional: Negative for appetite change, chills, fatigue and fever.  HENT: Negative for congestion, dental problem, ear pain and sore throat.   Eyes: Negative for discharge, redness and visual disturbance.  Respiratory: Negative for cough, chest tightness, shortness of breath and wheezing.   Cardiovascular: Negative for chest pain, palpitations and leg swelling.  Gastrointestinal: Negative for abdominal pain, blood in stool, diarrhea, nausea and vomiting.  Genitourinary: Negative for difficulty urinating, dysuria, flank pain, frequency, hematuria and urgency.  Musculoskeletal: Positive for back pain (chronic). Negative for arthralgias, joint swelling, myalgias and neck stiffness.  Skin: Negative for pallor and rash.  Neurological: Negative for dizziness, speech difficulty, weakness and headaches.  Hematological: Negative for adenopathy. Does not bruise/bleed easily.  Psychiatric/Behavioral: Negative for confusion and sleep disturbance. The  patient is not nervous/anxious.     PE; Blood pressure (!) 162/83, pulse 91, temperature 98.3 F (36.8 C), temperature source Oral, resp. rate 16,  height 6\' 4"  (1.93 m), weight 239 lb (108.4 kg), SpO2 96 %. Gen: Alert, well appearing.  Patient is oriented to person, place, time, and situation. AFFECT: pleasant, lucid thought and speech. ENT: Eyes: no injection, icteris, swelling, or exudate.  EOMI, PERRLA. Nose: no drainage or turbinate edema/swelling.  No injection or focal lesion.  Mouth: lips without lesion/swelling.  Oral mucosa pink and moist.  Dentition intact and without obvious caries or gingival swelling.  Oropharynx without erythema, exudate, or swelling.  Neck: supple/nontender.  No LAD, mass, or TM.  Carotid pulses 2+ bilaterally, without bruits. CV: RRR, no m/r/g.   LUNGS: CTA bilat, nonlabored resps, good aeration in all lung fields. ABD: soft, NT, ND, BS normal.  No hepatospenomegaly or mass.  No bruits. EXT: no clubbing, cyanosis, or edema.  Musculoskeletal: no joint swelling, erythema, warmth, or tenderness.  ROM of all joints intact. Skin - no sores or suspicious lesions or rashes or color changes   Pertinent labs:  Lab Results  Component Value Date   TSH 2.00 02/27/2016   Lab Results  Component Value Date   WBC 4.1 07/22/2016   HGB 14.9 07/22/2016   HCT 45.2 07/22/2016   MCV 92.5 07/22/2016   PLT 100.0 (L) 07/22/2016   Lab Results  Component Value Date   CREATININE 0.88 07/08/2017   BUN 28 (H) 07/08/2017   NA 142 07/08/2017   K 4.5 07/08/2017   CL 107 07/08/2017   CO2 26 07/08/2017   Lab Results  Component Value Date   ALT 30 02/26/2017   AST 29 02/26/2017   ALKPHOS 42 02/27/2016   BILITOT 1.2 02/26/2017   Lab Results  Component Value Date   CHOL 116 11/17/2016   Lab Results  Component Value Date   HDL 41 11/17/2016   Lab Results  Component Value Date   LDLCALC 55 11/17/2016   Lab Results  Component Value Date   TRIG 102 11/17/2016   Lab Results  Component Value Date   CHOLHDL 2.8 11/17/2016   Lab Results  Component Value Date   PSA 0.08 (L) 07/08/2017   Lab Results   Component Value Date   HGBA1C 5.0 02/27/2016    ASSESSMENT AND PLAN:   Pre-operative medical clearance for planned L spine surgery. He is doing well, has no contraindication for planned surgery, no indication for any cardiopulmonary testing at this time. Will check CBC and CMET. He has a history of elevated bp's in medical setting, so this should be monitored and treated appropriately in perioperative/operative/postop setting.  Again, home bp's consistently normal OFF of meds. Current med list: adderall XR (prn), cialis 5mg  qd, clonazepam 0.5mg  tid for restless legs syndrome, meclizine 25mg  tid prn dizziness, ropinirol 1mg  tid for restless legs syndrome, and rosuvastatin 10mg  qd.  An After Visit Summary was printed and given to the patient.  FOLLOW UP:  Return in about 6 months (around 05/14/2018) for annual CPE (fasting).  Signed:  Crissie Sickles, MD           11/11/2017

## 2017-11-12 LAB — COMPREHENSIVE METABOLIC PANEL
AG Ratio: 2 (calc) (ref 1.0–2.5)
ALT: 27 U/L (ref 9–46)
AST: 27 U/L (ref 10–35)
Albumin: 4.5 g/dL (ref 3.6–5.1)
Alkaline phosphatase (APISO): 57 U/L (ref 40–115)
BUN: 16 mg/dL (ref 7–25)
CO2: 26 mmol/L (ref 20–32)
Calcium: 9.4 mg/dL (ref 8.6–10.3)
Chloride: 107 mmol/L (ref 98–110)
Creat: 1.14 mg/dL (ref 0.70–1.18)
Globulin: 2.2 g/dL (calc) (ref 1.9–3.7)
Glucose, Bld: 103 mg/dL — ABNORMAL HIGH (ref 65–99)
Potassium: 4.9 mmol/L (ref 3.5–5.3)
Sodium: 144 mmol/L (ref 135–146)
Total Bilirubin: 1.3 mg/dL — ABNORMAL HIGH (ref 0.2–1.2)
Total Protein: 6.7 g/dL (ref 6.1–8.1)

## 2017-11-12 LAB — CBC
HCT: 48.8 % (ref 38.5–50.0)
Hemoglobin: 16.5 g/dL (ref 13.2–17.1)
MCH: 31.1 pg (ref 27.0–33.0)
MCHC: 33.8 g/dL (ref 32.0–36.0)
MCV: 92.1 fL (ref 80.0–100.0)
MPV: 13.1 fL — ABNORMAL HIGH (ref 7.5–12.5)
Platelets: 141 10*3/uL (ref 140–400)
RBC: 5.3 10*6/uL (ref 4.20–5.80)
RDW: 12 % (ref 11.0–15.0)
WBC: 5 10*3/uL (ref 3.8–10.8)

## 2017-11-25 MED ORDER — DIPHENHYDRAMINE HCL 25 MG PO CAPS
25.00 | ORAL_CAPSULE | ORAL | Status: DC
Start: ? — End: 2017-11-25

## 2017-11-25 MED ORDER — GABAPENTIN 300 MG PO CAPS
300.00 | ORAL_CAPSULE | ORAL | Status: DC
Start: 2017-11-25 — End: 2017-11-25

## 2017-11-25 MED ORDER — ATORVASTATIN CALCIUM 40 MG PO TABS
40.00 | ORAL_TABLET | ORAL | Status: DC
Start: 2017-11-25 — End: 2017-11-25

## 2017-11-25 MED ORDER — ONDANSETRON HCL 4 MG/2ML IJ SOLN
4.00 | INTRAMUSCULAR | Status: DC
Start: ? — End: 2017-11-25

## 2017-11-25 MED ORDER — METHOCARBAMOL 750 MG PO TABS
750.00 | ORAL_TABLET | ORAL | Status: DC
Start: 2017-11-25 — End: 2017-11-25

## 2017-11-25 MED ORDER — KETOROLAC TROMETHAMINE 15 MG/ML IJ SOLN
15.00 | INTRAMUSCULAR | Status: DC
Start: ? — End: 2017-11-25

## 2017-11-25 MED ORDER — ZOLPIDEM TARTRATE 5 MG PO TABS
5.00 | ORAL_TABLET | ORAL | Status: DC
Start: ? — End: 2017-11-25

## 2017-11-25 MED ORDER — POTASSIUM CHLORIDE IN NACL 20-0.9 MEQ/L-% IV SOLN
100.00 | INTRAVENOUS | Status: DC
Start: ? — End: 2017-11-25

## 2017-11-25 MED ORDER — POLYETHYLENE GLYCOL 3350 17 G PO PACK
17.00 g | PACK | ORAL | Status: DC
Start: 2017-11-26 — End: 2017-11-25

## 2017-11-25 MED ORDER — HYDROCODONE-ACETAMINOPHEN 5-325 MG PO TABS
1.00 | ORAL_TABLET | ORAL | Status: DC
Start: ? — End: 2017-11-25

## 2017-11-25 MED ORDER — ROPINIROLE HCL 1 MG PO TABS
1.00 | ORAL_TABLET | ORAL | Status: DC
Start: 2017-11-25 — End: 2017-11-25

## 2017-11-25 MED ORDER — SENNOSIDES-DOCUSATE SODIUM 8.6-50 MG PO TABS
1.00 | ORAL_TABLET | ORAL | Status: DC
Start: 2017-11-25 — End: 2017-11-25

## 2017-11-25 MED ORDER — BISACODYL 10 MG RE SUPP
10.00 | RECTAL | Status: DC
Start: ? — End: 2017-11-25

## 2017-11-25 MED ORDER — MECLIZINE HCL 25 MG PO TABS
25.00 | ORAL_TABLET | ORAL | Status: DC
Start: 2017-11-26 — End: 2017-11-25

## 2017-11-25 MED ORDER — HYDROCODONE-ACETAMINOPHEN 5-325 MG PO TABS
2.00 | ORAL_TABLET | ORAL | Status: DC
Start: ? — End: 2017-11-25

## 2017-11-25 MED ORDER — ACETAMINOPHEN 325 MG PO TABS
650.00 | ORAL_TABLET | ORAL | Status: DC
Start: ? — End: 2017-11-25

## 2018-01-13 ENCOUNTER — Emergency Department (HOSPITAL_COMMUNITY)
Admission: EM | Admit: 2018-01-13 | Discharge: 2018-01-13 | Disposition: A | Discharge status: Home / Self Care | Payer: 59 | Attending: Emergency Medicine | Admitting: Emergency Medicine

## 2018-01-13 ENCOUNTER — Emergency Department (HOSPITAL_COMMUNITY): Payer: 59

## 2018-01-13 ENCOUNTER — Encounter (HOSPITAL_COMMUNITY): Payer: Self-pay | Admitting: *Deleted

## 2018-01-13 ENCOUNTER — Other Ambulatory Visit: Payer: Self-pay

## 2018-01-13 DIAGNOSIS — M79602 Pain in left arm: Secondary | ICD-10-CM

## 2018-01-13 DIAGNOSIS — R0789 Other chest pain: Secondary | ICD-10-CM | POA: Diagnosis present

## 2018-01-13 DIAGNOSIS — F909 Attention-deficit hyperactivity disorder, unspecified type: Secondary | ICD-10-CM | POA: Diagnosis not present

## 2018-01-13 DIAGNOSIS — Z79899 Other long term (current) drug therapy: Secondary | ICD-10-CM | POA: Insufficient documentation

## 2018-01-13 DIAGNOSIS — I10 Essential (primary) hypertension: Secondary | ICD-10-CM | POA: Insufficient documentation

## 2018-01-13 LAB — BASIC METABOLIC PANEL
Anion gap: 7 (ref 5–15)
BUN: 15 mg/dL (ref 8–23)
CO2: 22 mmol/L (ref 22–32)
Calcium: 8.6 mg/dL — ABNORMAL LOW (ref 8.9–10.3)
Chloride: 111 mmol/L (ref 98–111)
Creatinine, Ser: 0.88 mg/dL (ref 0.61–1.24)
GFR calc Af Amer: >60 mL/min (ref >60)
GFR calc non Af Amer: >60 mL/min (ref >60)
Glucose, Bld: 128 mg/dL — ABNORMAL HIGH (ref 70–99)
Potassium: 3.5 mmol/L (ref 3.5–5.1)
Sodium: 140 mmol/L (ref 135–145)

## 2018-01-13 LAB — I-STAT TROPONIN, ED
Troponin i, poc: 0.01 ng/mL (ref 0.00–0.08)
Troponin i, poc: 0.01 ng/mL (ref 0.00–0.08)

## 2018-01-13 LAB — CBC
HCT: 44.3 % (ref 39.0–52.0)
Hemoglobin: 14.1 g/dL (ref 13.0–17.0)
MCH: 29.6 pg (ref 26.0–34.0)
MCHC: 31.8 g/dL (ref 30.0–36.0)
MCV: 93.1 fL (ref 80.0–100.0)
Platelets: 125 10*3/uL — ABNORMAL LOW (ref 150–400)
RBC: 4.76 MIL/uL (ref 4.22–5.81)
RDW: 12.6 % (ref 11.5–15.5)
WBC: 4 10*3/uL (ref 4.0–10.5)
nRBC: 0 % (ref 0.0–0.2)

## 2018-01-13 MED ORDER — MECLIZINE HCL 25 MG PO TABS: ORAL_TABLET | ORAL | 11 refills | Status: DC

## 2018-01-13 MED ORDER — KETOROLAC TROMETHAMINE 30 MG/ML IJ SOLN
30.0000 mg | Freq: Once | INTRAMUSCULAR | Status: AC
  Administered 2018-01-13: 30 mg via INTRAVENOUS
  Filled 2018-01-13: qty 1

## 2018-01-13 NOTE — ED Notes (Signed)
Pt presents with chest pressure that has resolved and left arm pain that radiates down to his fingers. Pt denies any recent injury.

## 2018-01-13 NOTE — ED Triage Notes (Signed)
Pt arrived by EMS for chest pressure that started 3 hours ago while pt was sitting in a chair. Pt also reports nausea this morning and L arm/shoulder pain. Denies injury to arm. Given 324mg  ASA, currently denies chest pain

## 2018-01-13 NOTE — ED Notes (Signed)
Patient verbalizes understanding of discharge instructions. Opportunity for questioning and answers were provided. Armband removed by staff, pt discharged from ED home via POV.  

## 2018-01-13 NOTE — Discharge Instructions (Addendum)
Follow-up with your spine surgeon in the near future, and return to the emergency department if you develop severe chest pain, difficulty breathing, or other new and concerning symptoms.

## 2018-01-13 NOTE — ED Provider Notes (Signed)
[facility] Provider Note   CSN: [id number] Arrival date & time: 01/13/18  0054     History   Chief Complaint Chief Complaint  Patient presents with  . Chest Pain    HPI Andrew Stevenson is a 72 y.o. male.  Patient is a 72 year old male with past medical history of multiple prior back surgeries with chronic pain, anxiety, and depression.  He presents today for evaluation of chest and left shoulder pain.  He states that this evening while watching TV he began to feel poorly.  He checked his blood pressure and it was elevated to nearly 270 systolic.  He describes pressure in the front of his chest and left shoulder that began shortly afterward.  He denied any shortness of breath, nausea, or diaphoresis.  The chest discomfort has since resolved and he now feels improved, however does report ongoing discomfort in his left shoulder.  He has no prior cardiac history and denies any recent exertional symptoms.  The history is provided by the patient.  Chest Pain   This is a new problem. Episode onset: 9 PM. The problem occurs constantly. The problem has been gradually improving. The pain is present in the substernal region. The pain is mild. The quality of the pain is described as pressure-like. The pain does not radiate.    Past Medical History:  Diagnosis Date  . Adult ADHD    adderall caused elevated bp  . Anxiety and depression    in past/ due to back injuries  . At risk for sleep apnea    STOP-BANG= 5      SENT TO PCP 11-05-2014  . Benign paroxysmal positional vertigo   . Branch retinal artery occlusion of right eye   . Chronic pain   . Depression   . Disequilibrium syndrome    Carotid dopplers and MRI brain NORMAL x 2 (including eighth nerve studies on most recent MRI 08/2016).  . Erectile dysfunction   . Heart murmur   . History of concussion    june 2015--  hit head w/ syncope---  no residual  . History of memory loss    per prior PCP  records./ per pt due to medication  . History of syncope    june 2015 --syncope w/ fall and pt states has happened 5 more times , the last one being jan 2016,,  states had battery of test and unknown cause  . Hx of adenomatous colonic polyps 05/25/2017   Recall 05/2022  . Hyperlipidemia 02/2016   atorva 02/2016---lipid #s worse so changed to generic crestor.  Much improved on crestor.  . IFG (impaired fasting glucose) 02/2016   HbA1c 5 %.  . Insomnia   . Lower urinary tract symptoms (LUTS)   . Mild aortic stenosis    per echo valve area 1.78cm^2  . Myalgia and myositis, unspecified   . Osteoarthritis, multiple sites    C-spine, L spine, knees, "all my joints basically"  . Prostate cancer [facility]) urologist-  dr ottelin/  oncologist-  dr Valere Dross   2016 Stage T1c,  Gleason 4+3,  PSA 5.26, vol 49.19cc  . RLS (restless legs syndrome)   . Thrombocytopenia (HCC)    >100K.  Marland Kitchen Upper airway cough syndrome 2018   Dr. Halford Chessman.  Cough therapy maximized at f/u 07/2016; referred to ENT by Pulm.  Allergy testing OK. Improved as of 08/05/16 pulm f/u.  Marland Kitchen Vertebrobasilar insufficiency    per neurologist note dr Felecia Shelling-- this is  possible causing syncope-- no tx just be careful getting up from sitting position  . Wears glasses   . White coat syndrome without hypertension     Patient Active Problem List   Diagnosis Date Noted  . Hx of adenomatous colonic polyps 06/02/2017  . Chronic cough 07/22/2016  . Hoarseness 07/22/2016  . Vertebrobasilar insufficiency 12/18/2014  . Vertebro basilar ischemia 12/18/2014  . Transient cerebral ischemia 12/18/2014  . Malignant neoplasm of prostate ([place]) 12/03/2014  . Numbness 07/06/2014  . Restless leg syndrome 07/06/2014  . Syncope 03/28/2014  . Orthostatic hypotension 03/28/2014  . Right-sided low back pain without sciatica 03/28/2014  . HTN (hypertension) 03/28/2014  . Insomnia 03/28/2014  . Prolonged Q-T interval on ECG 03/28/2014  . Syncope and collapse 03/28/2014     Past Surgical History:  Procedure Laterality Date  . Carotid Dopplers  08/01/2014   NORMAL (Dr. Felecia Shelling)  . CERVICAL FUSION  last one 2010   2 times  . COLONOSCOPY  02/2004; 05/2017   2019: adenomatous polyp, int hem, diverticulosis, radiation proctitis.  Recall 05/2022.  Marland Kitchen EYE SURGERY Bilateral 2013   5 right eye, 3 left eye surgeries (including cataract extraction's iol w/ lens implant, other surgery's for post-op lens fungal infection and floaters)  . GOLD SEED IMPLANT N/A 02/22/2015   Procedure: GOLD SEED IMPLANT;  Surgeon: Kathie Rhodes, MD;  Location: [facility];  Service: Urology;  Laterality: N/A;  . LEFT THIGH QUADRICEP MUSCLE BIOPSY'S  12-31-2005  . LUMBAR FUSION  x2  last one 2010   4 surgeries in back per pt  . MRA/MRI w/out contrast--brain  12/2014   mild, age-related chronic microvascular ischemic change (Dr. Felecia Shelling).  Marland Kitchen RADIOACTIVE SEED IMPLANT N/A 05/17/2015   Procedure: RADIOACTIVE SEED IMPLANT/BRACHYTHERAPY IMPLANT;  Surgeon: Kathie Rhodes, MD;  Location: [place];  Service: Urology;  Laterality: N/A;  . TRANSRECTAL ULTRASOUND N/A 11/12/2014   Procedure: TRANSRECTAL ULTRASOUND AND BIOPSY  ;  Surgeon: Kathie Rhodes, MD;  Location: [facility];  Service: Urology;  Laterality: N/A;  . TRANSTHORACIC ECHOCARDIOGRAM  03-29-2014   mild concentric LVH/  ef 87-68%/  grade I diastolic dysfunction/  AV poorly visualized, possibly bicupid;  mild AS,  valve area 1.78cm^2,  mean grandient 36mm Hg/  trivial TR        Home Medications    Prior to Admission medications   Medication Sig Start Date End Date Taking? Authorizing Provider  amphetamine-dextroamphetamine (ADDERALL XR) 30 MG 24 hr capsule 2 caps po qAM 07/14/17   McGowen, Adrian Blackwater, MD  b complex vitamins capsule Take 1 capsule by mouth daily.    [provider]  CIALIS 5 MG tablet Take 1 tablet by mouth daily. 02/22/16   [provider]  clonazePAM (KLONOPIN) 0.5  MG tablet 1 tab po tid for restless legs syndrome 08/19/16   McGowen, Adrian Blackwater, MD  meclizine (ANTIVERT) 25 MG tablet TAKE 1 TABLET BY MOUTH 3 TIMES DAILY AS NEEDED FOR DIZZINESS. 02/26/17   McGowen, Adrian Blackwater, MD  Multiple Vitamin (MULTIVITAMIN) tablet Take 1 tablet by mouth daily.    [provider]  rOPINIRole (REQUIP) 1 MG tablet Take 1 tablet (1 mg total) by mouth 3 (three) times daily. 04/12/17   McGowen, Adrian Blackwater, MD  rosuvastatin (CRESTOR) 10 MG tablet TAKE 1 TABLET BY MOUTH EVERY DAY 10/25/17   McGowen, Adrian Blackwater, MD    Family History Family History  Problem Relation Age of Onset  . Hypertension Mother   .  Alcohol abuse Father   . Arthritis Father   . Lung cancer Father        smoker  . Diabetes Brother   . Alcohol abuse Maternal Grandfather   . Stroke Maternal Grandfather   . Cancer Paternal Grandmother   . Alcohol abuse Paternal Grandfather   . Lung cancer Brother        smoker    Social History Social History   Tobacco Use  . Smoking status: Never Smoker  . Smokeless tobacco: Current User    Types: Snuff  . Tobacco comment: using snuff x 25 years  Substance Use Topics  . Alcohol use: Yes    Alcohol/week: 0.0 standard drinks    Comment: rare  . Drug use: No     Allergies   Hydrocodone and Penicillins   Review of Systems Review of Systems  Cardiovascular: Positive for chest pain.  All other systems reviewed and are negative.    Physical Exam Updated Vital Signs There were no vitals taken for this visit.  Physical Exam  Constitutional: He is oriented to person, place, and time. He appears well-developed and well-nourished. No distress.  HENT:  Head: Normocephalic and atraumatic.  Mouth/Throat: Oropharynx is clear and moist.  Neck: Normal range of motion. Neck supple.  Cardiovascular: Normal rate and regular rhythm. Exam reveals no friction rub.  No murmur heard. Pulmonary/Chest: Effort normal and breath sounds normal. No respiratory distress.  He has no wheezes. He has no rales.  Abdominal: Soft. Bowel sounds are normal. He exhibits no distension. There is no tenderness.  Musculoskeletal: Normal range of motion. He exhibits no edema.       Right lower leg: Normal. He exhibits no tenderness and no edema.       Left lower leg: Normal. He exhibits no tenderness and no edema.  Neurological: He is alert and oriented to person, place, and time. Coordination normal.  Skin: Skin is warm and dry. He is not diaphoretic.  Nursing note and vitals reviewed.    ED Treatments / Results  Labs (all labs ordered are listed, but only abnormal results are displayed) Labs Reviewed  BASIC METABOLIC PANEL - Abnormal; Notable for the following components:      Result Value   Glucose, Bld 128 (*)    Calcium 8.6 (*)    All other components within normal limits  CBC - Abnormal; Notable for the following components:   Platelets 125 (*)    All other components within normal limits  I-STAT TROPONIN, ED    EKG EKG Interpretation  Date/Time:  Thursday January 13 2018 01:11:12 EDT Ventricular Rate:  67 PR Interval:    QRS Duration: 105 QT Interval:  429 QTC Calculation: 453 R Axis:   28 Text Interpretation:  Sinus rhythm Abnormal R-wave progression, early transition Baseline wander in lead(s) V1 Confirmed by Veryl Speak [phone]) on 01/13/2018 2:06:38 AM   Radiology No results found.  Procedures Procedures (including critical care time)  Medications Ordered in ED Medications - No data to display   Initial Impression / Assessment and Plan / ED Course  I have reviewed the triage vital signs and the nursing notes.  Pertinent labs & imaging results that were available during my care of the patient were reviewed by me and considered in my medical decision making (see chart for details).  Patient with history of degenerative disc disease with multiple prior back surgeries presenting with complaints of discomfort in his left upper chest and  shoulder.  This  began earlier this evening.  His pain does not appear to be cardiac in nature.  He has had negative troponin x2 and EKG shows no acute changes.  I suspect possibly a radiculopathy from the neck.  He states that he will follow-up with his surgeon in the next few days and will return to the ER if he develops worsening symptoms.  Final Clinical Impressions(s) / ED Diagnoses   Final diagnoses:  None    ED Discharge Orders    None       Veryl Speak, MD 01/13/18 [phone]

## 2018-01-24 ENCOUNTER — Other Ambulatory Visit: Payer: Self-pay | Admitting: Family Medicine

## 2018-01-24 NOTE — Telephone Encounter (Signed)
Copied from [place] [phone]. Topic: Quick Communication - Rx Refill/Question >> Jan 24, 2018 11:31 AM Oneta Rack wrote:  Medication: amphetamine-dextroamphetamine (ADDERALL XR) 30 MG 24 hr capsule   Has the patient contacted their pharmacy? No.  Preferred Pharmacy (with phone number or street name):  [id number] [place], [place]  [phone] (Phone) [phone] (Fax)  Agent: Please be advised that RX refills may take up to 3 business days. We ask that you follow-up with your pharmacy.

## 2018-01-24 NOTE — Telephone Encounter (Signed)
Requested medication (s) are due for refill today: yes  Requested medication (s) are on the active medication list: yes    Last refill: 07/14/17  #60  0 refills  Future visit scheduled yes   05/13/2018  Dr. Anitra Lauth  Notes to clinic:not delegated  Requested Prescriptions  Pending Prescriptions Disp Refills   amphetamine-dextroamphetamine (ADDERALL XR) 30 MG 24 hr capsule 60 capsule 0    Sig: 2 caps po qAM     Not Delegated - Psychiatry:  Stimulants/ADHD Failed - 01/24/2018 11:52 AM      Failed - This refill cannot be delegated      Failed - Urine Drug Screen completed in last 360 days.      Passed - Valid encounter within last 3 months    Recent Outpatient Visits          2 months ago Preoperative clearance   [facility], Adrian Blackwater, MD   6 months ago Acute left lumbar radiculopathy   [facility], Adrian Blackwater, MD   6 months ago Left thigh pain   [facility], Algis [place]house, MD   7 months ago Primary osteoarthritis of left knee   [facility], Adrian Blackwater, MD   11 months ago Clavicle enlargement   [facility], Adrian Blackwater, MD      Future Appointments            In 3 months [place], Adrian Blackwater, MD [place]

## 2018-01-25 ENCOUNTER — Other Ambulatory Visit: Payer: Self-pay | Admitting: Family Medicine

## 2018-01-25 MED ORDER — AMPHETAMINE-DEXTROAMPHET ER 30 MG PO CP24
ORAL_CAPSULE | ORAL | 0 refills | Status: DC
Start: 2018-01-25 — End: 2018-01-25

## 2018-01-25 MED ORDER — AMPHETAMINE-DEXTROAMPHET ER 30 MG PO CP24: ORAL_CAPSULE | ORAL | 0 refills | Status: DC

## 2018-01-25 NOTE — Telephone Encounter (Signed)
RF request for adderall LOV: 11/11/17 Next ov: 05/13/18 Last written: 07/14/17 #60 w/ 0RF. (pt was given 3 Rx's one for May, June and July).  Please advise. Thanks.

## 2018-04-28 ENCOUNTER — Other Ambulatory Visit: Payer: Self-pay | Admitting: Family Medicine

## 2018-05-04 ENCOUNTER — Emergency Department (HOSPITAL_COMMUNITY)
Admission: EM | Admit: 2018-05-04 | Discharge: 2018-05-04 | Disposition: A | Discharge status: Home / Self Care | Payer: 59 | Attending: Emergency Medicine | Admitting: Emergency Medicine

## 2018-05-04 ENCOUNTER — Emergency Department (HOSPITAL_COMMUNITY): Payer: 59

## 2018-05-04 ENCOUNTER — Other Ambulatory Visit: Payer: Self-pay

## 2018-05-04 ENCOUNTER — Encounter (HOSPITAL_COMMUNITY): Payer: Self-pay | Admitting: Emergency Medicine

## 2018-05-04 DIAGNOSIS — Z5321 Procedure and treatment not carried out due to patient leaving prior to being seen by health care provider: Secondary | ICD-10-CM | POA: Insufficient documentation

## 2018-05-04 DIAGNOSIS — R42 Dizziness and giddiness: Secondary | ICD-10-CM | POA: Diagnosis present

## 2018-05-04 LAB — CBC WITH DIFFERENTIAL/PLATELET
Abs Immature Granulocytes: 0.04 10*3/uL (ref 0.00–0.07)
Basophils Absolute: 0 10*3/uL (ref 0.0–0.1)
Basophils Relative: 0 %
Eosinophils Absolute: 0 10*3/uL (ref 0.0–0.5)
Eosinophils Relative: 0 %
HCT: 52.2 % — ABNORMAL HIGH (ref 39.0–52.0)
Hemoglobin: 16 g/dL (ref 13.0–17.0)
Immature Granulocytes: 1 %
Lymphocytes Relative: 5 %
Lymphs Abs: 0.4 10*3/uL — ABNORMAL LOW (ref 0.7–4.0)
MCH: 29.3 pg (ref 26.0–34.0)
MCHC: 30.7 g/dL (ref 30.0–36.0)
MCV: 95.6 fL (ref 80.0–100.0)
Monocytes Absolute: 0.7 10*3/uL (ref 0.1–1.0)
Monocytes Relative: 9 %
Neutro Abs: 7 10*3/uL (ref 1.7–7.7)
Neutrophils Relative %: 85 %
Platelets: 111 10*3/uL — ABNORMAL LOW (ref 150–400)
RBC: 5.46 MIL/uL (ref 4.22–5.81)
RDW: 13.8 % (ref 11.5–15.5)
WBC: 8.2 10*3/uL (ref 4.0–10.5)
nRBC: 0 % (ref 0.0–0.2)

## 2018-05-04 LAB — COMPREHENSIVE METABOLIC PANEL
ALT: 29 U/L (ref 0–44)
AST: 32 U/L (ref 15–41)
Albumin: 4.4 g/dL (ref 3.5–5.0)
Alkaline Phosphatase: 86 U/L (ref 38–126)
Anion gap: 11 (ref 5–15)
BUN: 14 mg/dL (ref 8–23)
CO2: 20 mmol/L — ABNORMAL LOW (ref 22–32)
Calcium: 8.9 mg/dL (ref 8.9–10.3)
Chloride: 106 mmol/L (ref 98–111)
Creatinine, Ser: 0.75 mg/dL (ref 0.61–1.24)
GFR calc Af Amer: >60 mL/min (ref >60)
GFR calc non Af Amer: >60 mL/min (ref >60)
Glucose, Bld: 106 mg/dL — ABNORMAL HIGH (ref 70–99)
Potassium: 3.8 mmol/L (ref 3.5–5.1)
Sodium: 137 mmol/L (ref 135–145)
Total Bilirubin: 2.3 mg/dL — ABNORMAL HIGH (ref 0.3–1.2)
Total Protein: 7.3 g/dL (ref 6.5–8.1)

## 2018-05-04 LAB — LACTIC ACID, PLASMA: Lactic Acid, Venous: 1.5 mmol/L (ref 0.5–1.9)

## 2018-05-04 LAB — PROTIME-INR
INR: 1.1
Prothrombin Time: 14.1 seconds (ref 11.4–15.2)

## 2018-05-04 NOTE — ED Triage Notes (Addendum)
Pt went to a urgent care for feeling bad today. Pt had urine that was infected and they sent to ED for further sepsis work up. Pt is scheduled to see urologist tomorrow for urinary problems and check up on prostate.  Pt reports chills then sweats and dizziness. Pt denies taking any tylenol or motrin since this morning.

## 2018-05-09 LAB — CULTURE, BLOOD (ROUTINE X 2)
Culture: NO GROWTH
Special Requests: ADEQUATE

## 2018-05-12 ENCOUNTER — Encounter: Payer: Self-pay | Admitting: *Deleted

## 2018-05-13 ENCOUNTER — Encounter: Payer: Self-pay | Admitting: Family Medicine

## 2018-05-13 ENCOUNTER — Ambulatory Visit (INDEPENDENT_AMBULATORY_CARE_PROVIDER_SITE_OTHER): Payer: 59 | Admitting: Family Medicine

## 2018-05-13 VITALS — BP 123/76 | HR 72 | Temp 97.3°F | Resp 16 | Ht 75.5 in | Wt 237.0 lb

## 2018-05-13 DIAGNOSIS — E663 Overweight: Secondary | ICD-10-CM | POA: Diagnosis not present

## 2018-05-13 DIAGNOSIS — Z0000 Encounter for general adult medical examination without abnormal findings: Secondary | ICD-10-CM | POA: Diagnosis not present

## 2018-05-13 DIAGNOSIS — E7800 Pure hypercholesterolemia, unspecified: Secondary | ICD-10-CM | POA: Diagnosis not present

## 2018-05-13 DIAGNOSIS — D696 Thrombocytopenia, unspecified: Secondary | ICD-10-CM | POA: Diagnosis not present

## 2018-05-13 MED ORDER — CLONAZEPAM 0.5 MG PO TABS: ORAL_TABLET | ORAL | 5 refills | Status: DC

## 2018-05-13 MED ORDER — ZOSTER VAC RECOMB ADJUVANTED 50 MCG/0.5ML IM SUSR: 0.5000 mL | Freq: Once | INTRAMUSCULAR | 1 refills | Status: AC

## 2018-05-13 MED ORDER — AMPHETAMINE-DEXTROAMPHET ER 30 MG PO CP24: ORAL_CAPSULE | ORAL | 0 refills | Status: DC

## 2018-05-13 NOTE — Progress Notes (Signed)
Office Note 05/13/2018  CC:  Chief Complaint  Patient presents with  . Annual Exam    pt is fasting    HPI:  Andrew Stevenson is a 73 y.o. White male who is here for annual health maintenance exam.  Adult ADHD: Pt states all is going well with the med at current dosing: much improved focus, concentration, task completion.  Less frustration, better multitasking, less impulsivity and restlessness.  Mood is stable. No side effects from the medication.  He saw Dr. Maia Petties yesterday for ongoing right lumbar radiculopathy pain (he is s/p L3-S1 revision laminectomy and fusion 11/23/2017).  Plan is for R L2 and R S1 spinal nerve block injections, +norco prn breakthrough pain.   ED visit 05/04/18 for :  Had UTI, was treated and then sx's returned with worry about F/C systemic infxn.  He was unable to wait long enough to be seen.  He saw his urologist the next day and they started him on cipro and he hasn't had any problems at all since then.    Past Medical History:  Diagnosis Date  . Adult ADHD    adderall caused elevated bp  . Anxiety and depression    in past/ due to back injuries  . At risk for sleep apnea    STOP-BANG= 5      SENT TO PCP 11-05-2014  . Benign paroxysmal positional vertigo   . Branch retinal artery occlusion of right eye   . Chronic pain   . Depression   . Disequilibrium syndrome    Carotid dopplers and MRI brain NORMAL x 2 (including eighth nerve studies on most recent MRI 08/2016).  . Erectile dysfunction   . Heart murmur   . History of concussion    june 2015--  hit head w/ syncope---  no residual  . History of memory loss    per prior PCP records./ per pt due to medication  . History of syncope    june 2015 --syncope w/ fall and pt states has happened 5 more times , the last one being jan 2016,,  states had battery of test and unknown cause  . Hx of adenomatous colonic polyps 05/25/2017   Recall 05/2022  . Hyperlipidemia 02/2016   atorva 02/2016---lipid #s  worse so changed to generic crestor.  Much improved on crestor.  . IFG (impaired fasting glucose) 02/2016   HbA1c 5 %.  . Insomnia   . Lower urinary tract symptoms (LUTS)   . Mild aortic stenosis    per echo valve area 1.78cm^2  . Myalgia and myositis, unspecified   . Osteoarthritis, multiple sites    C-spine, L spine, knees, "all my joints basically"  . Prostate cancer [facility]) urologist-  dr ottelin/  oncologist-  dr Valere Dross   2016 Stage T1c,  Gleason 4+3,  PSA 5.26, vol 49.19cc  . RLS (restless legs syndrome)   . Thrombocytopenia (HCC)    >100K.  Marland Kitchen Upper airway cough syndrome 2018   Dr. Halford Chessman.  Cough therapy maximized at f/u 07/2016; referred to ENT by Pulm.  Allergy testing OK. Improved as of 08/05/16 pulm f/u.  Marland Kitchen Vertebrobasilar insufficiency    per neurologist note dr Felecia Shelling-- this is possible causing syncope-- no tx just be careful getting up from sitting position  . Wears glasses   . White coat syndrome without hypertension     Past Surgical History:  Procedure Laterality Date  . Carotid Dopplers  08/01/2014   NORMAL (Dr. Felecia Shelling)  . CERVICAL FUSION  last one 2010   2 times  . COLONOSCOPY  02/2004; 05/2017   2019: adenomatous polyp, int hem, diverticulosis, radiation proctitis.  Recall 05/2022.  Marland Kitchen EYE SURGERY Bilateral 2013   5 right eye, 3 left eye surgeries (including cataract extraction's iol w/ lens implant, other surgery's for post-op lens fungal infection and floaters)  . GOLD SEED IMPLANT N/A 02/22/2015   Procedure: GOLD SEED IMPLANT;  Surgeon: Kathie Rhodes, MD;  Location[facility]  Service: Urology;  Laterality: N/A;  . LEFT THIGH QUADRICEP MUSCLE BIOPSY'S  12-31-2005  . LUMBAR FUSION  x2  last one 2010   4 surgeries in back per pt  . MRA/MRI w/out contrast--brain  12/2014   mild, age-related chronic microvascular ischemic change (Dr. Felecia Shelling).  Marland Kitchen RADIOACTIVE SEED IMPLANT N/A 05/17/2015   Procedure: RADIOACTIVE SEED IMPLANT/BRACHYTHERAPY IMPLANT;  Surgeon:  Kathie Rhodes, MD;  Location: [place];  Service: Urology;  Laterality: N/A;  . TRANSRECTAL ULTRASOUND N/A 11/12/2014   Procedure: TRANSRECTAL ULTRASOUND AND BIOPSY  ;  Surgeon: Kathie Rhodes, MD;  Location: [facility];  Service: Urology;  Laterality: N/A;  . TRANSTHORACIC ECHOCARDIOGRAM  03-29-2014   mild concentric LVH/  ef 61-60%/  grade I diastolic dysfunction/  AV poorly visualized, possibly bicupid;  mild AS,  valve area 1.78cm^2,  mean grandient 31mm Hg/  trivial TR    Family History  Problem Relation Age of Onset  . Hypertension Mother   . Alcohol abuse Father   . Arthritis Father   . Lung cancer Father        smoker  . Diabetes Brother   . Alcohol abuse Maternal Grandfather   . Stroke Maternal Grandfather   . Cancer Paternal Grandmother   . Alcohol abuse Paternal Grandfather   . Lung cancer Brother        smoker    Social History   Socioeconomic History  . Marital status: Married    Spouse name: Not on file  . Number of children: Not on file  . Years of education: Not on file  . Highest education level: Not on file  Occupational History  . Occupation: UNEMPLOYED  Social Needs  . Financial resource strain: Not on file  . Food insecurity:    Worry: Not on file    Inability: Not on file  . Transportation needs:    Medical: Not on file    Non-medical: Not on file  Tobacco Use  . Smoking status: Never Smoker  . Smokeless tobacco: Former Systems developer    Types: Snuff  . Tobacco comment: using snuff x 25 years  Substance and Sexual Activity  . Alcohol use: Yes    Alcohol/week: 0.0 standard drinks    Comment: rare  . Drug use: No  . Sexual activity: Not on file  Lifestyle  . Physical activity:    Days per week: Not on file    Minutes per session: Not on file  . Stress: Not on file  Relationships  . Social connections:    Talks on phone: Not on file    Gets together: Not on file    Attends religious service: Not on file    Active  member of club or organization: Not on file    Attends meetings of clubs or organizations: Not on file    Relationship status: Not on file  . Intimate partner violence:    Fear of current or ex partner: Not on file    Emotionally abused: Not  on file    Physically abused: Not on file    Forced sexual activity: Not on file  Other Topics Concern  . Not on file  Social History Narrative   Married, 1 daughter.   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   No tob.   Occ alcohol.    Outpatient Medications Prior to Visit  Medication Sig Dispense Refill  . b complex vitamins capsule Take 1 capsule by mouth daily.    . celecoxib (CELEBREX) 200 MG capsule     . CIALIS 5 MG tablet Take 5 mg by mouth daily.     . ciprofloxacin (CIPRO) 500 MG tablet Take 500 mg by mouth 2 (two) times daily.    Marland Kitchen gabapentin (NEURONTIN) 300 MG capsule TAKE 1 CAPSULE BY MOUTH AT NIGHT X3 DAYS, THEN TWICE A DAY X3 DAYS THEN THREE TIMES A DAY  0  . meclizine (ANTIVERT) 25 MG tablet TAKE 1 TABLET BY MOUTH 3 TIMES DAILY AS NEEDED FOR DIZZINESS. 90 tablet 11  . Melatonin 3 MG CAPS Take by mouth.    . Multiple Vitamin (MULTIVITAMIN) tablet Take 1 tablet by mouth daily.    Marland Kitchen rOPINIRole (REQUIP) 1 MG tablet Take 1 tablet (1 mg total) by mouth 3 (three) times daily. 270 tablet 3  . rosuvastatin (CRESTOR) 10 MG tablet TAKE 1 TABLET BY MOUTH EVERY DAY 90 tablet 1  . amphetamine-dextroamphetamine (ADDERALL XR) 30 MG 24 hr capsule 2 caps po qAM 60 capsule 0  . clonazePAM (KLONOPIN) 0.5 MG tablet 1 tab po tid for restless legs syndrome (Patient taking differently: Take 0.5 mg by mouth 3 (three) times daily. ) 90 tablet 5   No facility-administered medications prior to visit.     Allergies  Allergen Reactions  . Oxycodone Itching    nightmares  . Penicillins Other (See Comments)    Unknown childhood reaction    ROS Review of Systems  Constitutional: Negative for appetite change,  chills, fatigue and fever.  HENT: Negative for congestion, dental problem, ear pain and sore throat.   Eyes: Negative for discharge, redness and visual disturbance.  Respiratory: Negative for cough, chest tightness, shortness of breath and wheezing.   Cardiovascular: Negative for chest pain, palpitations and leg swelling.  Gastrointestinal: Negative for abdominal pain, blood in stool, diarrhea, nausea and vomiting.  Genitourinary: Negative for difficulty urinating, dysuria, flank pain, frequency, hematuria and urgency.  Musculoskeletal: Negative for arthralgias, back pain, joint swelling, myalgias and neck stiffness.  Skin: Negative for pallor and rash.  Neurological: Negative for dizziness, speech difficulty, weakness and headaches.  Hematological: Negative for adenopathy. Does not bruise/bleed easily.  Psychiatric/Behavioral: Negative for confusion and sleep disturbance. The patient is not nervous/anxious.     PE; Blood pressure 123/76, pulse 72, temperature (!) 97.3 F (36.3 C), temperature source Oral, resp. rate 16, height 6' 3.5" (1.918 m), weight 237 lb (107.5 kg), SpO2 95 %. Body mass index is 29.23 kg/m.  Gen: Alert, well appearing.  Patient is oriented to person, place, time, and situation. AFFECT: pleasant, lucid thought and speech. ENT: Ears: EACs clear, normal epithelium.  TMs with good light reflex and landmarks bilaterally.  Eyes: no injection, icteris, swelling, or exudate.  EOMI, PERRLA. Nose: no drainage or turbinate edema/swelling.  No injection or focal lesion.  Mouth: lips without lesion/swelling.  Oral mucosa pink and moist.  Dentition intact and without obvious caries or gingival swelling.  Oropharynx without erythema, exudate, or swelling.  Neck:  supple/nontender.  No LAD, mass, or TM.  Carotid pulses 2+ bilaterally, without bruits. CV: RRR, no m/r/g.   LUNGS: CTA bilat, nonlabored resps, good aeration in all lung fields. ABD: soft, NT, ND, BS normal.  No  hepatospenomegaly or mass.  No bruits. EXT: no clubbing, cyanosis, or edema.  Musculoskeletal: no joint swelling, erythema, warmth, or tenderness.  ROM of all joints intact. Skin - no sores or suspicious lesions or rashes or color changes   Pertinent labs:  Lab Results  Component Value Date   TSH 2.00 02/27/2016   Lab Results  Component Value Date   WBC 8.2 05/04/2018   HGB 16.0 05/04/2018   HCT 52.2 (H) 05/04/2018   MCV 95.6 05/04/2018   PLT 111 (L) 05/04/2018   Lab Results  Component Value Date   CREATININE 0.75 05/04/2018   BUN 14 05/04/2018   NA 137 05/04/2018   K 3.8 05/04/2018   CL 106 05/04/2018   CO2 20 (L) 05/04/2018   Lab Results  Component Value Date   ALT 29 05/04/2018   AST 32 05/04/2018   ALKPHOS 86 05/04/2018   BILITOT 2.3 (H) 05/04/2018   Lab Results  Component Value Date   CHOL 116 11/17/2016   Lab Results  Component Value Date   HDL 41 11/17/2016   Lab Results  Component Value Date   LDLCALC 55 11/17/2016   Lab Results  Component Value Date   TRIG 102 11/17/2016   Lab Results  Component Value Date   CHOLHDL 2.8 11/17/2016   Lab Results  Component Value Date   PSA 0.08 (L) 07/08/2017   Lab Results  Component Value Date   HGBA1C 5.0 02/27/2016   ASSESSMENT AND PLAN:   1) Health maintenance exam: Reviewed age and gender appropriate health maintenance issues (prudent diet, regular exercise, health risks of tobacco and excessive alcohol, use of seatbelts, fire alarms in home, use of sunscreen).  Also reviewed age and gender appropriate health screening as well as vaccine recommendations. Vaccines: Shingrix-->   rx sent to pharmacy. Labs: FLP today, o/w all utd. Prostate ca screening: hx pro ca, followed by urol. Colon ca screening: next colonoscopy 05/2022.  2) Chronic R lumbar radiculopathy: pt to get injections and if that doesn't work then spinal stimulator to be tried.  3) UTI with systemic features about 1 wk ago: resolved  with abx per his urologist.  An After Visit Summary was printed and given to the patient.  FOLLOW UP:  Return in about 6 months (around 11/11/2018) for routine chronic illness f/u.  Signed:  Crissie Sickles, MD           05/13/2018

## 2018-05-13 NOTE — Patient Instructions (Signed)

## 2018-05-14 LAB — LIPID PANEL
Cholesterol: 118 mg/dL (ref <200)
HDL: 46 mg/dL (ref >=40)
LDL Cholesterol (Calc): 59 mg/dL (calc)
Non-HDL Cholesterol (Calc): 72 mg/dL (calc) (ref <130)
Total CHOL/HDL Ratio: 2.6 (calc) (ref <5.0)
Triglycerides: 54 mg/dL (ref <150)

## 2018-05-16 ENCOUNTER — Telehealth: Payer: Self-pay

## 2018-05-16 NOTE — Telephone Encounter (Signed)
-----   Message from Tammi Sou, MD sent at 05/14/2018  2:24 PM EST ----- Cholesterol numbers excellent. Continue current cholesterol med at current dose.-thx

## 2018-05-25 ENCOUNTER — Telehealth: Payer: Self-pay | Admitting: Nurse Practitioner

## 2018-05-25 ENCOUNTER — Other Ambulatory Visit: Payer: Self-pay | Admitting: Orthopaedic Surgery

## 2018-05-25 DIAGNOSIS — M4327 Fusion of spine, lumbosacral region: Secondary | ICD-10-CM

## 2018-05-25 NOTE — Telephone Encounter (Signed)
Phone call to patient to verify medication list and allergies for myelogram procedure. Pt instructed to hold Adderall for 48hrs prior to myelogram appointment time. Pt verbalized understanding.  

## 2018-06-06 ENCOUNTER — Other Ambulatory Visit: Payer: 59

## 2018-07-04 ENCOUNTER — Other Ambulatory Visit: Payer: 59

## 2018-07-04 ENCOUNTER — Inpatient Hospital Stay: Admission: RE | Admit: 2018-07-04 | Payer: 59 | Source: Ambulatory Visit

## 2018-08-01 ENCOUNTER — Telehealth: Payer: Self-pay | Admitting: Nurse Practitioner

## 2018-08-01 NOTE — Telephone Encounter (Signed)
Phone call to patient to verify medication list and allergies for myelogram procedure. Pt instructed to hold Adderall for 48hrs prior to myelogram appointment time. Pt verbalized understanding.

## 2018-08-15 ENCOUNTER — Ambulatory Visit
Admission: RE | Admit: 2018-08-15 | Discharge: 2018-08-15 | Disposition: A | Discharge status: Home / Self Care | Payer: 59 | Source: Ambulatory Visit | Attending: Orthopaedic Surgery | Admitting: Orthopaedic Surgery

## 2018-08-15 ENCOUNTER — Other Ambulatory Visit: Payer: Self-pay

## 2018-08-15 DIAGNOSIS — M4327 Fusion of spine, lumbosacral region: Secondary | ICD-10-CM

## 2018-08-15 MED ORDER — ONDANSETRON HCL 4 MG/2ML IJ SOLN
4.0000 mg | Freq: Once | INTRAMUSCULAR | Status: AC
  Administered 2018-08-15: 4 mg via INTRAMUSCULAR

## 2018-08-15 MED ORDER — DIAZEPAM 5 MG PO TABS
10.0000 mg | ORAL_TABLET | Freq: Once | ORAL | Status: AC
  Administered 2018-08-15: 10 mg via ORAL

## 2018-08-15 MED ORDER — MEPERIDINE HCL 50 MG/ML IJ SOLN
50.0000 mg | Freq: Once | INTRAMUSCULAR | Status: AC
  Administered 2018-08-15: 50 mg via INTRAMUSCULAR

## 2018-08-15 MED ORDER — IOPAMIDOL (ISOVUE-M 200) INJECTION 41%
18.0000 mL | Freq: Once | INTRAMUSCULAR | Status: AC
  Administered 2018-08-15: 18 mL via INTRATHECAL

## 2018-08-15 NOTE — Progress Notes (Signed)
Patient states he has been off Adderall for at least the past two days.

## 2018-08-15 NOTE — Discharge Instructions (Signed)
Myelogram Discharge Instructions  1. Go home and rest quietly for the next 24 hours.  It is important to lie flat for the next 24 hours.  Get up only to go to the restroom.  You may lie in the bed or on a couch on your back, your stomach, your left side or your right side.  You may have one pillow under your head.  You may have pillows between your knees while you are on your side or under your knees while you are on your back.  2. DO NOT drive today.  Recline the seat as far back as it will go, while still wearing your seat belt, on the way home.  3. You may get up to go to the bathroom as needed.  You may sit up for 10 minutes to eat.  You may resume your normal diet and medications unless otherwise indicated.  Drink lots of extra fluids today and tomorrow.  4. The incidence of headache, nausea, or vomiting is about 5% (one in 20 patients).  If you develop a headache, lie flat and drink plenty of fluids until the headache goes away.  Caffeinated beverages may be helpful.  If you develop severe nausea and vomiting or a headache that does not go away with flat bed rest, call [phone].  5. You may resume normal activities after your 24 hours of bed rest is over; however, do not exert yourself strongly or do any heavy lifting tomorrow. If when you get up you have a headache when standing, go back to bed and force fluids for another 24 hours.  6. Call your physician for a follow-up appointment.  The results of your myelogram will be sent directly to your physician by the following day.  7. If you have any questions or if complications develop after you arrive home, please call [phone].  Discharge instructions have been explained to the patient.  The patient, or the person responsible for the patient, fully understands these instructions.  YOU MAY RESTART YOUR ADDERALL TOMORROW 08/16/2018 AT 1:20PM.

## 2018-09-14 ENCOUNTER — Telehealth: Payer: Self-pay | Admitting: Family Medicine

## 2018-09-14 NOTE — Telephone Encounter (Signed)
Received surgical clearance form for patient.   Please advise if appointment needed.

## 2018-09-14 NOTE — Telephone Encounter (Signed)
Patient is having back surgery by Dr. Patrice Paradise. Dr. Towanda Malkin office will be faxing over a pre-surgery clearance form. Please contact patient if he needs an appointment for the form to be completed. Thanks

## 2018-09-15 ENCOUNTER — Encounter: Payer: Self-pay | Admitting: Family Medicine

## 2018-09-15 NOTE — Telephone Encounter (Signed)
Forms placed on your desk.

## 2018-09-15 NOTE — Telephone Encounter (Signed)
I don't see a surgical clearance form for him back here.  Do you have it?

## 2018-09-15 NOTE — Telephone Encounter (Signed)
Signed clearance form and placed in box to go up front. No o/v is required.-thx

## 2018-09-19 NOTE — Telephone Encounter (Signed)
Patient was notified office visit not needed, form will be placed up front to be mailed back to him.

## 2018-09-26 LAB — HEPATIC FUNCTION PANEL
ALT: 29 (ref 10–40)
AST: 27 (ref 14–40)
Alkaline Phosphatase: 70 (ref 25–125)
Bilirubin, Total: 0.9

## 2018-09-26 LAB — CBC AND DIFFERENTIAL
HCT: 43 (ref 41–53)
Hemoglobin: 14.1 (ref 13.5–17.5)
Neutrophils Absolute: 2
Platelets: 90 — AB (ref 150–399)
WBC: 2.8

## 2018-09-26 LAB — BASIC METABOLIC PANEL
BUN: 20 (ref 4–21)
Creatinine: 0.9 (ref 0.6–1.3)
Glucose: 95
Potassium: 4 (ref 3.4–5.3)
Sodium: 141 (ref 137–147)

## 2018-09-27 ENCOUNTER — Telehealth: Payer: Self-pay

## 2018-09-27 NOTE — Telephone Encounter (Signed)
Received form from [facility] regarding pt's upcoming surgery on 7/20. Given to PCP for signature and review.

## 2018-09-28 ENCOUNTER — Telehealth: Payer: Self-pay

## 2018-09-28 NOTE — Telephone Encounter (Signed)
Recent labs given to PCP for review. Please advise if patient still cleared for surgery on 7/20, thanks.

## 2018-09-28 NOTE — Telephone Encounter (Signed)
Form has been faxed back, if Joy calls back.

## 2018-09-28 NOTE — Telephone Encounter (Signed)
I did this form once already and you noted that you mailed it to him 09/14/18.

## 2018-09-28 NOTE — Telephone Encounter (Signed)
LM for pt to return call to let us know if he received form that was mailed on 09/14/18.

## 2018-09-28 NOTE — Telephone Encounter (Signed)
Pt with hx of stable mild leukopenia an thrombocytopenia. Still cleared for surgery.

## 2018-09-28 NOTE — Telephone Encounter (Signed)
Confirmed pt received surgical clearance form filled out on 7/2.

## 2018-09-28 NOTE — Telephone Encounter (Signed)
Joy, from [facility], called stating she sent over pre op paper work for pts surgery on 10/03/2018. She is requesting a response. Please advise.   Callback # [phone]

## 2018-09-29 ENCOUNTER — Encounter: Payer: Self-pay | Admitting: Family Medicine

## 2018-09-29 NOTE — Telephone Encounter (Signed)
Labs from 7/13 reviewed by PCP. Joy from [facility] was notified yesterday afternoon that pt is still cleared for surgery. Lab papers faxed back with PCP annotations in case hard copy needed for clearance as well.

## 2018-10-03 DIAGNOSIS — M4327 Fusion of spine, lumbosacral region: Secondary | ICD-10-CM | POA: Insufficient documentation

## 2018-10-05 MED ORDER — ATORVASTATIN CALCIUM 40 MG PO TABS
40.00 | ORAL_TABLET | ORAL | Status: DC
Start: 2018-10-05 — End: 2018-10-05

## 2018-10-05 MED ORDER — MELATONIN 3 MG PO TABS
3.00 | ORAL_TABLET | ORAL | Status: DC
Start: 2018-10-04 — End: 2018-10-05

## 2018-10-05 MED ORDER — MORPHINE SULFATE 2 MG/ML IJ SOLN
2.00 | INTRAMUSCULAR | Status: DC
Start: ? — End: 2018-10-05

## 2018-10-05 MED ORDER — ROPINIROLE HCL 2 MG PO TABS
2.00 | ORAL_TABLET | ORAL | Status: DC
Start: 2018-10-04 — End: 2018-10-05

## 2018-10-05 MED ORDER — METHOCARBAMOL 500 MG PO TABS
500.00 | ORAL_TABLET | ORAL | Status: DC
Start: 2018-10-04 — End: 2018-10-05

## 2018-10-05 MED ORDER — ONDANSETRON HCL 4 MG/2ML IJ SOLN
4.00 | INTRAMUSCULAR | Status: DC
Start: ? — End: 2018-10-05

## 2018-10-05 MED ORDER — SENNOSIDES-DOCUSATE SODIUM 8.6-50 MG PO TABS
1.00 | ORAL_TABLET | ORAL | Status: DC
Start: 2018-10-04 — End: 2018-10-05

## 2018-10-05 MED ORDER — KETOROLAC TROMETHAMINE 15 MG/ML IJ SOLN
15.00 | INTRAMUSCULAR | Status: DC
Start: ? — End: 2018-10-05

## 2018-10-05 MED ORDER — HYDROCODONE-ACETAMINOPHEN 5-325 MG PO TABS
1.00 | ORAL_TABLET | ORAL | Status: DC
Start: ? — End: 2018-10-05

## 2018-10-05 MED ORDER — TAMSULOSIN HCL 0.4 MG PO CAPS
0.40 | ORAL_CAPSULE | ORAL | Status: DC
Start: 2018-10-05 — End: 2018-10-05

## 2018-10-05 MED ORDER — GABAPENTIN 300 MG PO CAPS
300.00 | ORAL_CAPSULE | ORAL | Status: DC
Start: 2018-10-04 — End: 2018-10-05

## 2018-10-05 MED ORDER — HYDROCODONE-ACETAMINOPHEN 5-325 MG PO TABS
2.00 | ORAL_TABLET | ORAL | Status: DC
Start: ? — End: 2018-10-05

## 2018-10-05 MED ORDER — DIPHENHYDRAMINE HCL 25 MG PO CAPS
25.00 | ORAL_CAPSULE | ORAL | Status: DC
Start: ? — End: 2018-10-05

## 2018-10-05 MED ORDER — POLYETHYLENE GLYCOL 3350 17 G PO PACK
17.00 | PACK | ORAL | Status: DC
Start: 2018-10-05 — End: 2018-10-05

## 2018-10-05 MED ORDER — PHENOL 1.4 % MT LIQD
1.00 | OROMUCOSAL | Status: DC
Start: ? — End: 2018-10-05

## 2018-10-05 MED ORDER — DIPHENHYDRAMINE HCL 50 MG/ML IJ SOLN
25.00 | INTRAMUSCULAR | Status: DC
Start: ? — End: 2018-10-05

## 2018-10-05 MED ORDER — METHYLPHENIDATE HCL 5 MG PO TABS
5.00 | ORAL_TABLET | ORAL | Status: DC
Start: 2018-10-05 — End: 2018-10-05

## 2018-10-05 MED ORDER — OXYCODONE-ACETAMINOPHEN 5-325 MG PO TABS
1.00 | ORAL_TABLET | ORAL | Status: DC
Start: ? — End: 2018-10-05

## 2018-10-05 MED ORDER — NALOXONE HCL 0.4 MG/ML IJ SOLN
0.10 | INTRAMUSCULAR | Status: DC
Start: ? — End: 2018-10-05

## 2018-10-05 MED ORDER — GENERIC EXTERNAL MEDICATION
Status: DC
Start: ? — End: 2018-10-05

## 2018-10-05 MED ORDER — ACETAMINOPHEN 325 MG PO TABS
650.00 | ORAL_TABLET | ORAL | Status: DC
Start: ? — End: 2018-10-05

## 2018-10-11 ENCOUNTER — Other Ambulatory Visit: Payer: Self-pay | Admitting: Family Medicine

## 2018-10-11 MED ORDER — AMPHETAMINE-DEXTROAMPHET ER 30 MG PO CP24: ORAL_CAPSULE | ORAL | 0 refills | Status: DC

## 2018-10-11 NOTE — Telephone Encounter (Signed)
Refill request  amphetamine-dextroamphetamine (ADDERALL XR) 30 MG 24 hr capsule   [facility]

## 2018-10-11 NOTE — Telephone Encounter (Signed)
RF request for Adderall LOV: 05/13/18 Next ov: advised to f/u 6 months Last written: 05/13/18(60,0) No CSC or UDS  Please advise, thanks. Medication pending

## 2018-11-05 ENCOUNTER — Other Ambulatory Visit: Payer: Self-pay | Admitting: Family Medicine

## 2018-11-23 ENCOUNTER — Telehealth: Payer: Self-pay | Admitting: Family Medicine

## 2018-11-23 ENCOUNTER — Other Ambulatory Visit: Payer: Self-pay

## 2018-11-23 MED ORDER — AMPHETAMINE-DEXTROAMPHET ER 30 MG PO CP24: ORAL_CAPSULE | ORAL | 0 refills | Status: DC

## 2018-11-23 NOTE — Telephone Encounter (Signed)
Left detailed message advising that refill was sent, okay per DPR. Will need to schedule f/u appt

## 2018-11-23 NOTE — Telephone Encounter (Signed)
RF request for Adderall LOV: 05/13/18 Next ov: advised to f/u 6 mo. Last written: 10/11/18(60,0) No CSC or UDS.  Please advise, thanks. Medication pending

## 2018-11-23 NOTE — Telephone Encounter (Signed)
Rx sent. Pt needs f/u before any FURTHER rx's for this med.  Also needs CSC done at next visit.-thx

## 2018-11-23 NOTE — Telephone Encounter (Signed)
Patient is requesting Adderal Rx to be sent to [facility].

## 2018-11-24 NOTE — Telephone Encounter (Signed)
Called patient and told him that his medication was sent to the pharm. We schedule his 63mo follow up

## 2018-12-02 ENCOUNTER — Telehealth: Payer: Self-pay | Admitting: Family Medicine

## 2018-12-02 NOTE — Telephone Encounter (Signed)
Patient called back and is experiencing cold sweats and hot sweats. No fever. Headache. Dizzy. No SOB. No loss of taste or smell. Not fatigued. Symptoms x1 week. Patient is not getting worse or better. No sinus pain or pressure. Patient recently had back surgery 11 months ago. Body rejected screws and plates 2 months ago. They had to redo the surgery patient mentioned. Patient has not been exposed to [place] that he knows of. [facility] since it is end of day and a weekend. [facility] that [facility]

## 2018-12-02 NOTE — Telephone Encounter (Signed)
Patient is experiencing cold sweats, then feels very hot. He is taking his temperature, he is not running a fever.  No other COVID symptoms.

## 2018-12-02 NOTE — Telephone Encounter (Signed)
Called patient and lvm to call me back

## 2018-12-05 NOTE — Telephone Encounter (Signed)
FYI  Please see below

## 2018-12-05 NOTE — Telephone Encounter (Signed)
OK, noted

## 2018-12-08 ENCOUNTER — Ambulatory Visit: Payer: 59 | Admitting: Family Medicine

## 2018-12-08 DIAGNOSIS — Z0289 Encounter for other administrative examinations: Secondary | ICD-10-CM

## 2018-12-13 ENCOUNTER — Other Ambulatory Visit: Payer: Self-pay | Admitting: Orthopaedic Surgery

## 2018-12-13 DIAGNOSIS — M4327 Fusion of spine, lumbosacral region: Secondary | ICD-10-CM

## 2018-12-19 ENCOUNTER — Other Ambulatory Visit: Payer: Self-pay

## 2018-12-19 ENCOUNTER — Encounter: Payer: Self-pay | Admitting: Family Medicine

## 2018-12-19 ENCOUNTER — Ambulatory Visit: Payer: 59 | Admitting: Family Medicine

## 2018-12-19 VITALS — BP 148/84 | HR 72 | Temp 99.2°F | Resp 16 | Ht 75.5 in | Wt 217.6 lb

## 2018-12-19 DIAGNOSIS — F988 Other specified behavioral and emotional disorders with onset usually occurring in childhood and adolescence: Secondary | ICD-10-CM

## 2018-12-19 DIAGNOSIS — Z125 Encounter for screening for malignant neoplasm of prostate: Secondary | ICD-10-CM | POA: Diagnosis not present

## 2018-12-19 DIAGNOSIS — E7800 Pure hypercholesterolemia, unspecified: Secondary | ICD-10-CM

## 2018-12-19 DIAGNOSIS — M8949 Other hypertrophic osteoarthropathy, multiple sites: Secondary | ICD-10-CM

## 2018-12-19 DIAGNOSIS — R7301 Impaired fasting glucose: Secondary | ICD-10-CM | POA: Diagnosis not present

## 2018-12-19 DIAGNOSIS — Z79899 Other long term (current) drug therapy: Secondary | ICD-10-CM

## 2018-12-19 DIAGNOSIS — M150 Primary generalized (osteo)arthritis: Secondary | ICD-10-CM

## 2018-12-19 DIAGNOSIS — Z23 Encounter for immunization: Secondary | ICD-10-CM

## 2018-12-19 DIAGNOSIS — M159 Polyosteoarthritis, unspecified: Secondary | ICD-10-CM

## 2018-12-19 DIAGNOSIS — D696 Thrombocytopenia, unspecified: Secondary | ICD-10-CM

## 2018-12-19 MED ORDER — AMPHETAMINE-DEXTROAMPHET ER 30 MG PO CP24: ORAL_CAPSULE | ORAL | 0 refills | Status: DC

## 2018-12-19 MED ORDER — CIALIS 5 MG PO TABS: 5.0000 mg | ORAL_TABLET | Freq: Every day | ORAL | 11 refills | Status: DC

## 2018-12-19 NOTE — Progress Notes (Signed)
OFFICE VISIT  12/19/2018   CC:  Chief Complaint  Patient presents with  . Follow-up    RCI, pt is fasting   HPI:    Patient is a 73 y.o. Caucasian male who presents for f/u adult ADD, osteoarthritis in multiple sites, hyperlipidemia, RLS.    Since I last saw him he had another lumbar back surgery (09/2018).  Says he is doing great, working on building a garage. Has chronic mild diffuse pain and stiffness in all joints.  Takes celebrex daily and it does help, and applies a hemp lotion and it helps his hands a lot.  No joint redness or significant swelling.  RLS well controlled on ropinirole 1 mg tid.  He does not take clonaz 0.5mg  tid due to oversedation.  Pt states all is going well with the med at current dosing: much improved focus, concentration, task completion.  Less frustration, better multitasking, less impulsivity and restlessness.  Mood is stable. No side effects from the medication. Takes two of the 30mg  xr adderall every morning.  Reviewed PMP AWARE today and most recent rx fill for this med was 11/23/18, #60, by me.  No suspicious activity. He'll do CSC today for this med and we'll get UDS today.    ROS: no CP, no SOB, no wheezing, no cough, no dizziness, no HAs, no rashes, no melena/hematochezia.  No polyuria or polydipsia.  No myalgias.   Past Medical History:  Diagnosis Date  . Adult ADHD    adderall caused elevated bp  . Anxiety and depression    in past/ due to back injuries  . At risk for sleep apnea    STOP-BANG= 5      SENT TO PCP 11-05-2014  . Benign paroxysmal positional vertigo   . Branch retinal artery occlusion of right eye   . Chronic pain   . Depression   . Disequilibrium syndrome    Carotid dopplers and MRI brain NORMAL x 2 (including eighth nerve studies on most recent MRI 08/2016).  . Erectile dysfunction   . Heart murmur   . History of concussion    june 2015--  hit head w/ syncope---  no residual  . History of memory loss    per prior PCP  records./ per pt due to medication  . History of syncope    june 2015 --syncope w/ fall and pt states has happened 5 more times , the last one being jan 2016,,  states had battery of test and unknown cause  . Hx of adenomatous colonic polyps 05/25/2017   Recall 05/2022  . Hyperlipidemia 02/2016   atorva 02/2016---lipid #s worse so changed to generic crestor.  Much improved on crestor.  . IFG (impaired fasting glucose) 02/2016   HbA1c 5 %.  . Insomnia   . Lower urinary tract symptoms (LUTS)   . Mild aortic stenosis    per echo valve area 1.78cm^2  . Myalgia and myositis, unspecified   . Osteoarthritis, multiple sites    C-spine, L spine, knees, "all my joints basically"  . Prostate cancer [facility] urologist-  dr ottelin/  oncologist-  dr Valere Dross   2016 Stage T1c,  Gleason 4+3,  PSA 5.26, vol 49.19cc  . RLS (restless legs syndrome)   . Thrombocytopenia (HCC)    >100K.  Mild, chronic  . Upper airway cough syndrome 2018   Dr. Halford Chessman.  Cough therapy maximized at f/u 07/2016; referred to ENT by Pulm.  Allergy testing OK. Improved as of 08/05/16 pulm f/u.  Marland Kitchen  Vertebrobasilar insufficiency    per neurologist note dr Felecia Shelling-- this is possible causing syncope-- no tx just be careful getting up from sitting position  . Wears glasses   . White coat syndrome without hypertension     Past Surgical History:  Procedure Laterality Date  . Carotid Dopplers  08/01/2014   NORMAL (Dr. Felecia Shelling)  . CERVICAL FUSION  last one 2010   2 times  . COLONOSCOPY  02/2004; 05/2017   2019: adenomatous polyp, int hem, diverticulosis, radiation proctitis.  Recall 05/2022.  Marland Kitchen EYE SURGERY Bilateral 2013   5 right eye, 3 left eye surgeries (including cataract extraction's iol w/ lens implant, other surgery's for post-op lens fungal infection and floaters)  . GOLD SEED IMPLANT N/A 02/22/2015   Procedure: GOLD SEED IMPLANT;  Surgeon: Kathie Rhodes, MD;  Location: [facility];  Service: Urology;  Laterality: N/A;  .  LEFT THIGH QUADRICEP MUSCLE BIOPSY'S  12-31-2005  . LUMBAR FUSION  x2  last one 2010   4 surgeries in back per pt  . MRA/MRI w/out contrast--brain  12/2014   mild, age-related chronic microvascular ischemic change (Dr. Felecia Shelling).  Marland Kitchen RADIOACTIVE SEED IMPLANT N/A 05/17/2015   Procedure: RADIOACTIVE SEED IMPLANT/BRACHYTHERAPY IMPLANT;  Surgeon: Kathie Rhodes, MD;  Location: [facility];  Service: Urology;  Laterality: N/A;  . TRANSRECTAL ULTRASOUND N/A 11/12/2014   Procedure: TRANSRECTAL ULTRASOUND AND BIOPSY  ;  Surgeon: Kathie Rhodes, MD;  Location: [facility];  Service: Urology;  Laterality: N/A;  . TRANSTHORACIC ECHOCARDIOGRAM  03-29-2014   mild concentric LVH/  ef [id number]  grade I diastolic dysfunction/  AV poorly visualized, possibly bicupid;  mild AS,  valve area 1.78cm^2,  mean grandient 52mm Hg/  trivial TR    Outpatient Medications Prior to Visit  Medication Sig Dispense Refill  . b complex vitamins capsule Take 1 capsule by mouth daily.    . celecoxib (CELEBREX) 200 MG capsule     . gabapentin (NEURONTIN) 300 MG capsule TAKE 1 CAPSULE BY MOUTH AT NIGHT X3 DAYS, THEN TWICE A DAY X3 DAYS THEN THREE TIMES A DAY  0  . Melatonin 3 MG CAPS Take by mouth.    . Multiple Vitamin (MULTIVITAMIN) tablet Take 1 tablet by mouth daily.    Marland Kitchen rOPINIRole (REQUIP) 1 MG tablet Take 1 tablet (1 mg total) by mouth 3 (three) times daily. 270 tablet 3  . rosuvastatin (CRESTOR) 10 MG tablet TAKE 1 TABLET BY MOUTH EVERY DAY 90 tablet 1  . amphetamine-dextroamphetamine (ADDERALL XR) 30 MG 24 hr capsule 2 caps po qAM 60 capsule 0  . CIALIS 5 MG tablet Take 5 mg by mouth daily.     . ciprofloxacin (CIPRO) 500 MG tablet Take 500 mg by mouth 2 (two) times daily.    . clonazePAM (KLONOPIN) 0.5 MG tablet 1 tab po tid for restless legs syndrome (Patient not taking: Reported on 12/19/2018) 90 tablet 5   No facility-administered medications prior to visit.     Allergies  Allergen Reactions   . Methylphenidate Palpitations    Pt reported; confirmed he wanted med added to his allergy list  . Oxycodone Itching and Other (See Comments)    Nightmares, hallucinations  . Penicillins Other (See Comments)    Unknown childhood reaction    ROS As per HPI  PE: Blood pressure (!) 148/84, pulse 72, temperature 99.2 F (37.3 C), temperature source Temporal, resp. rate 16, height 6' 3.5" (1.918 m), weight 217 lb 9.6 oz (98.7  kg), SpO2 94 %. Gen: Alert, well appearing.  Patient is oriented to person, place, time, and situation. AFFECT: pleasant, lucid thought and speech. No further exam today.  LABS:  Lab Results  Component Value Date   TSH 2.00 02/27/2016   Lab Results  Component Value Date   WBC 2.8 09/26/2018   HGB 14.1 09/26/2018   HCT 43 09/26/2018   MCV 95.6 05/04/2018   PLT 90 (A) 09/26/2018   Lab Results  Component Value Date   CREATININE 0.9 09/26/2018   BUN 20 09/26/2018   NA 141 09/26/2018   K 4.0 09/26/2018   CL 106 05/04/2018   CO2 20 (L) 05/04/2018   Lab Results  Component Value Date   ALT 29 09/26/2018   AST 27 09/26/2018   ALKPHOS 70 09/26/2018   BILITOT 2.3 (H) 05/04/2018   Lab Results  Component Value Date   CHOL 118 05/13/2018   Lab Results  Component Value Date   HDL 46 05/13/2018   Lab Results  Component Value Date   LDLCALC 59 05/13/2018   Lab Results  Component Value Date   TRIG 54 05/13/2018   Lab Results  Component Value Date   CHOLHDL 2.6 05/13/2018   Lab Results  Component Value Date   PSA 0.08 (L) 07/08/2017   Lab Results  Component Value Date   HGBA1C 5.0 02/27/2016    IMPRESSION AND PLAN:  1) Adult ADD: The current medical regimen is effective;  continue present plan and medications. I did electronic rx's for adderall xr 30mg , 2 tabs qAM, #60  today for this month, Nov 2020, and Dec 2020.  Appropriate fill on/after date was noted on each rx. CSC today. UDS today-->should show only amphetamines.  2) RLS: The  current medical regimen is effective;  continue present plan and medications. Taking only ropinirole for this and is doing well.  I took clonaz off his med list today b/c he could not tolerate it.  3) HLD: tolerating statin.  Not fasting today so we'll get FLP at next f/u.  Hepatic panel today.  4) Osteoarthritis multiple sites: mostly neck, low back, and hands. Celebrex working well for him, as is a hemp oil-based lotion for his hands.  5) ED: refilled pt's cialis today.  6) Chronic mild thrombocytopenia:  Check CBC today.  7) Prostate ca screening: his last PSA was 18 mo ago and he would like PSA for screening today.  An After Visit Summary was printed and given to the patient.  FOLLOW UP: Return in about 6 months (around 06/19/2019) for annual CPE (fasting).  Signed:  Crissie Sickles, MD           12/19/2018

## 2018-12-20 LAB — PSA, MEDICARE: PSA: 0.03 ng/ml — ABNORMAL LOW (ref 0.10–4.00)

## 2018-12-23 LAB — COMPREHENSIVE METABOLIC PANEL
AG Ratio: 1.8 (calc) (ref 1.0–2.5)
ALT: 26 U/L (ref 9–46)
AST: 32 U/L (ref 10–35)
Albumin: 4.2 g/dL (ref 3.6–5.1)
Alkaline phosphatase (APISO): 82 U/L (ref 35–144)
BUN: 21 mg/dL (ref 7–25)
CO2: 23 mmol/L (ref 20–32)
Calcium: 9.4 mg/dL (ref 8.6–10.3)
Chloride: 109 mmol/L (ref 98–110)
Creat: 0.74 mg/dL (ref 0.70–1.18)
Globulin: 2.3 g/dL (calc) (ref 1.9–3.7)
Glucose, Bld: 93 mg/dL (ref 65–99)
Potassium: 4.3 mmol/L (ref 3.5–5.3)
Sodium: 141 mmol/L (ref 135–146)
Total Bilirubin: 0.9 mg/dL (ref 0.2–1.2)
Total Protein: 6.5 g/dL (ref 6.1–8.1)

## 2018-12-23 LAB — CBC WITH DIFFERENTIAL/PLATELET
Absolute Monocytes: 515 cells/uL (ref 200–950)
Basophils Absolute: 10 cells/uL (ref 0–200)
Basophils Relative: 0.2 %
Eosinophils Absolute: 40 cells/uL (ref 15–500)
Eosinophils Relative: 0.8 %
HCT: 41.6 % (ref 38.5–50.0)
Hemoglobin: 13.6 g/dL (ref 13.2–17.1)
Lymphs Abs: 675 cells/uL — ABNORMAL LOW (ref 850–3900)
MCH: 29 pg (ref 27.0–33.0)
MCHC: 32.7 g/dL (ref 32.0–36.0)
MCV: 88.7 fL (ref 80.0–100.0)
MPV: 13.1 fL — ABNORMAL HIGH (ref 7.5–12.5)
Monocytes Relative: 10.3 %
Neutro Abs: 3760 cells/uL (ref 1500–7800)
Neutrophils Relative %: 75.2 %
Platelets: 117 10*3/uL — ABNORMAL LOW (ref 140–400)
RBC: 4.69 10*6/uL (ref 4.20–5.80)
RDW: 12.3 % (ref 11.0–15.0)
Total Lymphocyte: 13.5 %
WBC: 5 10*3/uL (ref 3.8–10.8)

## 2018-12-23 LAB — PAIN MGMT, PROFILE 8 W/CONF, U

## 2018-12-26 ENCOUNTER — Other Ambulatory Visit: Payer: Self-pay | Admitting: Family Medicine

## 2019-01-20 ENCOUNTER — Other Ambulatory Visit: Payer: Self-pay

## 2019-01-20 ENCOUNTER — Ambulatory Visit: Payer: 59 | Admitting: Family Medicine

## 2019-01-20 ENCOUNTER — Encounter: Payer: Self-pay | Admitting: Family Medicine

## 2019-01-20 VITALS — BP 134/74 | HR 67 | Temp 99.0°F | Resp 16 | Ht 75.5 in | Wt 234.4 lb

## 2019-01-20 DIAGNOSIS — R42 Dizziness and giddiness: Secondary | ICD-10-CM

## 2019-01-20 DIAGNOSIS — R319 Hematuria, unspecified: Secondary | ICD-10-CM

## 2019-01-20 DIAGNOSIS — R309 Painful micturition, unspecified: Secondary | ICD-10-CM

## 2019-01-20 DIAGNOSIS — R350 Frequency of micturition: Secondary | ICD-10-CM | POA: Diagnosis not present

## 2019-01-20 DIAGNOSIS — N390 Urinary tract infection, site not specified: Secondary | ICD-10-CM | POA: Diagnosis not present

## 2019-01-20 LAB — POCT URINALYSIS DIPSTICK
Glucose, UA: NEGATIVE
Nitrite, UA: POSITIVE
Protein, UA: POSITIVE — AB
Spec Grav, UA: >=1.03 — AB (ref 1.010–1.025)
Urobilinogen, UA: 1 E.U./dL
pH, UA: 6 (ref 5.0–8.0)

## 2019-01-20 MED ORDER — LEVOFLOXACIN 500 MG PO TABS: 500.0000 mg | ORAL_TABLET | Freq: Every day | ORAL | 0 refills | Status: DC

## 2019-01-20 NOTE — Progress Notes (Signed)
OFFICE VISIT  01/20/2019   CC:  Chief Complaint  Patient presents with  . urinary pain    frequency, burning   HPI:    Patient is a 73 y.o. Caucasian male who presents for "urinary pain". He has a hx of prostate ca and got radioactive seed implants procedure in 2017 (Dr. Karsten Ro). Last PSA 12/19/18 was undetectable.   Onset of burning with urination 3-4 d ago. Feeling of incomplete emptying.  Urgency and frequency.  No blood. Bad odor to urine.   No flank or abd pains.  No back pains that are new for him. Has had UTI in the past, same sx's.  Still having periods of vertigo.  This wave started about a week ago.  Mild nausea but no vomiting.  No HAs or fevers. Meclizine not helping much.  No positional trigger.  NO hearing c/o, no ringing in ears. He has had episodes of this over the last 5 yrs or so.  Big w/u unrevealing, including MRI/MRA brain. [place] has been + in the past in office.  He doesn't recall ever being given home epley maneuvers to do.  ROS: no CP, no SOB, no wheezing, no cough, no dizziness, no HAs, no rashes, no melena/hematochezia.  No polydipsia.  No myalgias or arthralgias.  Drowsiness from taking meclizine. No focal weakness, no paresthesias, no vision c/o.     Past Medical History:  Diagnosis Date  . Adult ADHD    adderall caused elevated bp  . Anxiety and depression    in past/ due to back injuries  . At risk for sleep apnea    STOP-BANG= 5      SENT TO PCP 11-05-2014  . Benign paroxysmal positional vertigo   . Branch retinal artery occlusion of right eye   . Chronic pain   . Depression   . Disequilibrium syndrome    Carotid dopplers and MRI brain NORMAL x 2 (including eighth nerve studies on most recent MRI 08/2016).  . Erectile dysfunction   . Heart murmur   . History of concussion    june 2015--  hit head w/ syncope---  no residual  . History of memory loss    per prior PCP records./ per pt due to medication  . History of syncope    june  2015 --syncope w/ fall and pt states has happened 5 more times , the last one being jan 2016,,  states had battery of test and unknown cause  . Hx of adenomatous colonic polyps 05/25/2017   Recall 05/2022  . Hyperlipidemia 02/2016   atorva 02/2016---lipid #s worse so changed to generic crestor.  Much improved on crestor.  . IFG (impaired fasting glucose) 02/2016   HbA1c 5 %.  . Insomnia   . Lower urinary tract symptoms (LUTS)   . Mild aortic stenosis    per echo valve area 1.78cm^2  . Myalgia and myositis, unspecified   . Osteoarthritis, multiple sites    C-spine, L spine, knees, "all my joints basically"  . Prostate cancer [facility] urologist-  dr ottelin/  oncologist-  dr Valere Dross   2016 Stage T1c,  Gleason 4+3,  PSA 5.26, vol 49.19cc  . RLS (restless legs syndrome)   . Thrombocytopenia (HCC)    >100K.  Mild, chronic  . Upper airway cough syndrome 2018   Dr. Halford Chessman.  Cough therapy maximized at f/u 07/2016; referred to ENT by Pulm.  Allergy testing OK. Improved as of 08/05/16 pulm f/u.  Marland Kitchen Vertebrobasilar insufficiency  per neurologist note dr Felecia Shelling-- this is possible causing syncope-- no tx just be careful getting up from sitting position  . Wears glasses   . White coat syndrome without hypertension     Past Surgical History:  Procedure Laterality Date  . Carotid Dopplers  08/01/2014   NORMAL (Dr. Felecia Shelling)  . CERVICAL FUSION  last one 2010   2 times  . COLONOSCOPY  02/2004; 05/2017   2019: adenomatous polyp, int hem, diverticulosis, radiation proctitis.  Recall 05/2022.  Marland Kitchen EYE SURGERY Bilateral 2013   5 right eye, 3 left eye surgeries (including cataract extraction's iol w/ lens implant, other surgery's for post-op lens fungal infection and floaters)  . GOLD SEED IMPLANT N/A 02/22/2015   Procedure: GOLD SEED IMPLANT;  Surgeon: Kathie Rhodes, MD;  Location: [facility];  Service: Urology;  Laterality: N/A;  . LEFT THIGH QUADRICEP MUSCLE BIOPSY'S  12-31-2005  . LUMBAR FUSION   x2  last one 2010   4 surgeries in back per pt  . MRA/MRI w/out contrast--brain  12/2014   mild, age-related chronic microvascular ischemic change (Dr. Felecia Shelling).  Marland Kitchen RADIOACTIVE SEED IMPLANT N/A 05/17/2015   Procedure: RADIOACTIVE SEED IMPLANT/BRACHYTHERAPY IMPLANT;  Surgeon: Kathie Rhodes, MD;  Location: [facility];  Service: Urology;  Laterality: N/A;  . TRANSRECTAL ULTRASOUND N/A 11/12/2014   Procedure: TRANSRECTAL ULTRASOUND AND BIOPSY  ;  Surgeon: Kathie Rhodes, MD;  Location: [facility];  Service: Urology;  Laterality: N/A;  . TRANSTHORACIC ECHOCARDIOGRAM  03-29-2014   mild concentric LVH/  ef [id number]  grade I diastolic dysfunction/  AV poorly visualized, possibly bicupid;  mild AS,  valve area 1.78cm^2,  mean grandient 9mm Hg/  trivial TR    Outpatient Medications Prior to Visit  Medication Sig Dispense Refill  . amphetamine-dextroamphetamine (ADDERALL XR) 30 MG 24 hr capsule 2 caps po qAM 60 capsule 0  . b complex vitamins capsule Take 1 capsule by mouth daily.    . celecoxib (CELEBREX) 200 MG capsule     . CIALIS 5 MG tablet Take 1 tablet (5 mg total) by mouth daily. 10 tablet 11  . gabapentin (NEURONTIN) 300 MG capsule TAKE 1 CAPSULE BY MOUTH AT NIGHT X3 DAYS, THEN TWICE A DAY X3 DAYS THEN THREE TIMES A DAY  0  . HYDROcodone-acetaminophen (NORCO) 10-325 MG tablet Take 1 tablet by mouth every 4 (four) hours as needed.    . Melatonin 3 MG CAPS Take by mouth.    . Multiple Vitamin (MULTIVITAMIN) tablet Take 1 tablet by mouth daily.    Marland Kitchen rOPINIRole (REQUIP) 1 MG tablet Take 1 tablet (1 mg total) by mouth 3 (three) times daily. 270 tablet 3  . rosuvastatin (CRESTOR) 10 MG tablet TAKE 1 TABLET BY MOUTH EVERY DAY 90 tablet 1  . tiZANidine (ZANAFLEX) 4 MG capsule      No facility-administered medications prior to visit.     Allergies  Allergen Reactions  . Methylphenidate Palpitations    Pt reported; confirmed he wanted med added to his allergy list  .  Oxycodone Itching and Other (See Comments)    Nightmares, hallucinations  . Penicillins Other (See Comments)    Unknown childhood reaction    ROS As per HPI  PE: Blood pressure 134/74, pulse 67, temperature 99 F (37.2 C), temperature source Temporal, resp. rate 16, height 6' 3.5" (1.918 m), weight 234 lb 6.4 oz (106.3 kg), SpO2 99 %. Gen: Alert, well appearing.  Patient is oriented to person, place,  time, and situation. AFFECT: pleasant, lucid thought and speech. No further exam today.  LABS:    Chemistry      Component Value Date/Time   NA 141 12/19/2018 1452   NA 141 09/26/2018   K 4.3 12/19/2018 1452   CL 109 12/19/2018 1452   CO2 23 12/19/2018 1452   BUN 21 12/19/2018 1452   BUN 20 09/26/2018   CREATININE 0.74 12/19/2018 1452   GLU 95 09/26/2018      Component Value Date/Time   CALCIUM 9.4 12/19/2018 1452   ALKPHOS 70 09/26/2018   AST 32 12/19/2018 1452   ALT 26 12/19/2018 1452   BILITOT 0.9 12/19/2018 1452     Lab Results  Component Value Date   WBC 5.0 12/19/2018   HGB 13.6 12/19/2018   HCT 41.6 12/19/2018   MCV 88.7 12/19/2018   PLT 117 (L) 12/19/2018   POC CC UA today: Urine dark yellow, cloudy, with foul odor, SG 1.030, 3+ blood, protein 3+, nitrite +, leu 3+.  IMPRESSION AND PLAN:  1) Acute urethritis/cystitis, but will cover for acute prostatitis with 14d course of levaquin 500 mg qd. Sent urine for c/s.  Encouraged INCREASED water intake, decrease intake of colas.  2) Vertigo, suspect atypical BPPV. Discussed home epley maneuvers, gave handout about these today. Decrease intake of meclizine b/c this is causing too much drowsiness.  An After Visit Summary was printed and given to the patient.  FOLLOW UP: Return if symptoms worsen or fail to improve.  Signed:  Crissie Sickles, MD           01/20/2019

## 2019-01-23 LAB — URINE CULTURE
MICRO NUMBER:: 1073990
SPECIMEN QUALITY:: ADEQUATE

## 2019-01-30 ENCOUNTER — Telehealth: Payer: Self-pay

## 2019-01-30 NOTE — Telephone Encounter (Signed)
Noted  

## 2019-01-30 NOTE — Telephone Encounter (Signed)
Spoke with patient regarding after hours call.  Patient continues to experience cough and diarrhea. Denies SOB or fever.  [facility] [place] Advised to provide results to PCP when they are available. Discussed next steps/quarantine instructions. Patient verbalized understanding.   [name] - Client TELEPHONE ADVICE RECORD AccessNurse Patient Name: Andrew Stevenson Gender: Male DOB: 01-23-1946 Age: 73 Y 9 M 17 D Return Phone Number: [phone] (Primary) Address: City/State/Zip: [place] [address] Client [facility] Night - Client Client Site [place] Night Physician Crissie Sickles - MD Contact Type Call Who Is Calling Patient / Member / Family / Caregiver Call Type Triage / Clinical Relationship To Patient Self Return Phone Number [phone] (Primary) Chief Complaint Headache Reason for Call Symptomatic / Request for [place] states he woke up last night with chills and hot, bad headache and congestion. Caller reports he has been feeling bad for a few days. [place] Not Listed Urgent care close to callers house Translation No Nurse Assessment Nurse: Jimmye Norman, RN, [place] Date/Time (Eastern Time): 01/28/2019 8:35:42 AM Confirm and document reason for call. If symptomatic, describe symptoms. ---Caller states he woke up last night with chills and hot, bad headache and congestion. Caller reports he has been feeling bad for a few days. Last night he started feeling really bad, and is nauseated. Denies fever. Has the patient had close contact with a person known or suspected to have the novel coronavirus illness OR traveled / lives in area with major community spread (including international travel) in the last 14 days from the onset of symptoms? * If Asymptomatic, screen for exposure and travel within the last 14  days. ---No Does the patient have any new or worsening symptoms? ---Yes Will a triage be completed? ---Yes Related visit to physician within the last 2 weeks? ---No Does the PT have any chronic conditions? (i.e. diabetes, asthma, this includes High risk factors for pregnancy, etc.) ---No Is this a behavioral health or substance abuse call? ---No Guidelines Guideline Title Affirmed Question Affirmed Notes Nurse Date/Time (Eastern Time) Coronavirus (COVID-19) - Diagnosed or Suspected [1] COVID-19 infection suspected by caller or triager AND [2] mild symptoms (cough, fever, Jimmye Norman, RN, Whitney 01/28/2019 8:38:05 AM PLEASE NOTE: All timestamps contained within this report are represented as [place] Standard Time. CONFIDENTIALTY NOTICE: This fax transmission is intended only for the addressee. It contains information that is legally privileged, confidential or otherwise protected from use or disclosure. If you are not the intended recipient, you are strictly prohibited from reviewing, disclosing, copying using or disseminating any of this information or taking any action in reliance on or regarding this information. If you have received this fax in error, please notify us immediately by telephone so that we can arrange for its return to [place]. Phone: [phone], Toll-Free: [phone], Fax: [phone] Page: 2 of 2 Call Id: [id number] Guidelines Guideline Title Affirmed Question Affirmed Notes Nurse Date/Time Eilene Ghazi Time) or others) AND [id number] no complications or SOB Disp. Time Eilene Ghazi Time) Disposition Final User 01/28/2019 8:42:56 AM See HCP within 4 Hours (or PCP triage) Yes Jimmye Norman, RN, Whitney Disposition Overriden: Call PCP when Office is Open Override Reason: Patient's symptoms need a higher level of care Caller Disagree/Comply Comply Caller Understands Yes PreDisposition InappropriateToAsk Care Advice Given Per Guideline SEE HCP WITHIN 4 HOURS (OR PCP TRIAGE): * IF OFFICE  WILL BE CLOSED AND NO PCP (PRIMARY CARE  PROVIDER) SECOND-LEVEL TRIAGE: You need to be seen within the next 3 or 4 hours. A nearby Urgent Care Center [facility]) is often a good source of care. Another choice is to go to the ED. Go sooner if you become worse. CALL BACK IF: * You become worse. Referrals GO TO FACILITY OTHER - SPECIF

## 2019-02-02 ENCOUNTER — Telehealth: Payer: Self-pay | Admitting: Family Medicine

## 2019-02-02 MED ORDER — TRAZODONE HCL 50 MG PO TABS: ORAL_TABLET | ORAL | 0 refills | Status: DC

## 2019-02-02 NOTE — Telephone Encounter (Signed)
LM for pt to returncall

## 2019-02-02 NOTE — Telephone Encounter (Signed)
Pt's last visit was 12/19/18 for RCI f/u. Pt has a hx of insomnia.   Please advise if something could be sent or appt needed next week.

## 2019-02-02 NOTE — Telephone Encounter (Signed)
OK, trazodone eRx'd. If still having trouble sleeping next week then do VV.

## 2019-02-02 NOTE — Telephone Encounter (Signed)
Patient advised and voiced understanding.  

## 2019-02-02 NOTE — Telephone Encounter (Signed)
Pt called about having trouble with sleeping. Pt states he needs something for about 3-4 days just to get to sleep. Please advise and thank you!  Call pt @ [phone].  [id number] [place] [place] [facility] [address]

## 2019-03-21 ENCOUNTER — Ambulatory Visit: Payer: 59 | Admitting: Family Medicine

## 2019-04-12 ENCOUNTER — Other Ambulatory Visit: Payer: Self-pay | Admitting: Neurological Surgery

## 2019-04-12 DIAGNOSIS — M961 Postlaminectomy syndrome, not elsewhere classified: Secondary | ICD-10-CM

## 2019-04-17 ENCOUNTER — Other Ambulatory Visit: Payer: Self-pay

## 2019-04-17 MED ORDER — AMPHETAMINE-DEXTROAMPHET ER 30 MG PO CP24: ORAL_CAPSULE | ORAL | 0 refills | Status: DC

## 2019-04-17 NOTE — Telephone Encounter (Signed)
Requesting: adderall  Contract: 12/19/18 UDS:12/19/18 Last Visit:01/20/19, acute and 12/19/18 RCI Next Visit:advised to f/u April Last Refill: Dec 2020 (60,0)  Please Advise. Medication pending

## 2019-04-17 NOTE — Telephone Encounter (Signed)
Patient refill request. Patient will pick up tomorrow (04/18/19) at pharmacy.  amphetamine-dextroamphetamine (ADDERALL XR) 30 MG 24 hr capsule [id number]   [facility]

## 2019-04-18 NOTE — Telephone Encounter (Signed)
RF sent, patient advised.

## 2019-04-19 ENCOUNTER — Ambulatory Visit
Admission: RE | Admit: 2019-04-19 | Discharge: 2019-04-19 | Disposition: A | Discharge status: Home / Self Care | Payer: 59 | Source: Ambulatory Visit | Attending: Neurological Surgery | Admitting: Neurological Surgery

## 2019-04-19 DIAGNOSIS — M961 Postlaminectomy syndrome, not elsewhere classified: Secondary | ICD-10-CM

## 2019-04-30 ENCOUNTER — Ambulatory Visit: Payer: 59 | Attending: Internal Medicine

## 2019-04-30 DIAGNOSIS — Z23 Encounter for immunization: Secondary | ICD-10-CM | POA: Insufficient documentation

## 2019-04-30 NOTE — Progress Notes (Signed)
   Covid-19 Vaccination Clinic  Name:  Andrew Stevenson    MRN: [id number] DOB: 20-May-1945  04/30/2019  [name] was observed post Covid-19 immunization for 15 minutes without incidence. He was provided with Vaccine Information Sheet and instruction to access the V-Safe system.   [name] was instructed to call 911 with any severe reactions post vaccine: Marland Kitchen Difficulty breathing  . Swelling of your face and throat  . A fast heartbeat  . A bad rash all over your body  . Dizziness and weakness    Immunizations Administered    Name Date Dose VIS Date Route   Pfizer COVID-19 Vaccine 04/30/2019 10:43 AM 0.3 mL 02/24/2019 Intramuscular   Manufacturer: [place]   Lot: [id number]   [place]: [id number]

## 2019-05-10 ENCOUNTER — Other Ambulatory Visit: Payer: Self-pay

## 2019-05-10 MED ORDER — ROPINIROLE HCL 1 MG PO TABS: 1.0000 mg | ORAL_TABLET | Freq: Three times a day (TID) | ORAL | 3 refills | Status: DC

## 2019-05-10 NOTE — Telephone Encounter (Signed)
Patient request refill   rOPINIRole (REQUIP) 1 MG tablet [id number]   [facility]

## 2019-05-10 NOTE — Telephone Encounter (Signed)
RF request for Requip LOV:01/20/19, acute and 12/19/18 rci Next ov: advised to f/u April Last written:04/12/17 (270,3)  Please Advise. Medication pending

## 2019-05-22 ENCOUNTER — Other Ambulatory Visit: Payer: Self-pay

## 2019-05-22 NOTE — Telephone Encounter (Signed)
Patient request amphetamine-dextroamphetamine (ADDERALL XR) 30 MG 24 hr capsule to be sent to [place]

## 2019-05-23 ENCOUNTER — Ambulatory Visit: Payer: 59 | Attending: Internal Medicine

## 2019-05-23 DIAGNOSIS — Z23 Encounter for immunization: Secondary | ICD-10-CM

## 2019-05-23 NOTE — Progress Notes (Signed)
   Covid-19 Vaccination Clinic  Name:  Andrew Stevenson    MRN: [id number] DOB: 1945-06-08  05/23/2019  [name] was observed post Covid-19 immunization for 15 minutes without incident. He was provided with Vaccine Information Sheet and instruction to access the V-Safe system.   [name] was instructed to call 911 with any severe reactions post vaccine: Marland Kitchen Difficulty breathing  . Swelling of face and throat  . A fast heartbeat  . A bad rash all over body  . Dizziness and weakness   Immunizations Administered    Name Date Dose VIS Date Route   Pfizer COVID-19 Vaccine 05/23/2019 12:47 PM 0.3 mL 02/24/2019 Intramuscular   Manufacturer: [place]   Lot: [id number]   [place]: [id number]

## 2019-05-24 ENCOUNTER — Telehealth: Payer: Self-pay | Admitting: Family Medicine

## 2019-05-24 MED ORDER — AMPHETAMINE-DEXTROAMPHET ER 30 MG PO CP24: ORAL_CAPSULE | ORAL | 0 refills | Status: DC

## 2019-05-24 NOTE — Telephone Encounter (Signed)
Pt said he called the other day for refill on Adderall and his pharmacy has not received it. He is going out of town and needs it today or tomorrow. Please advise.

## 2019-05-24 NOTE — Telephone Encounter (Signed)
Message has already been sent to PCP with refill request. Medication pending currently

## 2019-05-24 NOTE — Telephone Encounter (Signed)
Patient advised that medication has been sent.

## 2019-05-24 NOTE — Telephone Encounter (Signed)
Requesting: adderall 30mg   Contract:12/19/18 UDS:12/19/18 Last Visit:01/20/19, acute and 12/19/18 routine f/u Next Visit: advised to f/u 27mo. april Last Refill:04/17/19(60,0)  Please Advise. Medication pending

## 2019-06-04 ENCOUNTER — Other Ambulatory Visit: Payer: Self-pay | Admitting: Family Medicine

## 2019-06-12 ENCOUNTER — Other Ambulatory Visit (HOSPITAL_BASED_OUTPATIENT_CLINIC_OR_DEPARTMENT_OTHER): Payer: Self-pay | Admitting: Rehabilitation

## 2019-06-12 DIAGNOSIS — M48062 Spinal stenosis, lumbar region with neurogenic claudication: Secondary | ICD-10-CM

## 2019-06-12 DIAGNOSIS — G9519 Other vascular myelopathies: Secondary | ICD-10-CM

## 2019-06-17 ENCOUNTER — Ambulatory Visit (HOSPITAL_BASED_OUTPATIENT_CLINIC_OR_DEPARTMENT_OTHER)
Admission: RE | Admit: 2019-06-17 | Discharge: 2019-06-17 | Disposition: A | Discharge status: Home / Self Care | Payer: 59 | Source: Ambulatory Visit | Attending: Rehabilitation | Admitting: Rehabilitation

## 2019-06-17 ENCOUNTER — Other Ambulatory Visit: Payer: Self-pay

## 2019-06-17 DIAGNOSIS — M48062 Spinal stenosis, lumbar region with neurogenic claudication: Secondary | ICD-10-CM | POA: Insufficient documentation

## 2019-06-17 DIAGNOSIS — G9519 Other vascular myelopathies: Secondary | ICD-10-CM

## 2019-06-17 MED ORDER — GADOBUTROL 1 MMOL/ML IV SOLN
10.0000 mL | Freq: Once | INTRAVENOUS | Status: AC | PRN
  Administered 2019-06-17: 10 mL via INTRAVENOUS

## 2019-06-22 ENCOUNTER — Other Ambulatory Visit: Payer: Self-pay

## 2019-06-22 MED ORDER — TRAZODONE HCL 50 MG PO TABS: ORAL_TABLET | ORAL | 3 refills | Status: DC

## 2019-06-22 NOTE — Telephone Encounter (Signed)
Patient is requesting Rx for Trazodone to help him sleep. He feels like he hasn't slept in days. He took it years ago and it helped. He uses [facility]

## 2019-06-22 NOTE — Telephone Encounter (Signed)
Last Rx given 02/02/19(10,0) for Trazodone, requesting new Rx for this. Last seen 01/20/19, acute and 12/19/18 for f/u ADD. Advised to f/u 6 months, April. LM for pt to schedule CPE  Please advise, thanks. Medication pending

## 2019-06-23 NOTE — Telephone Encounter (Signed)
Refill sent.

## 2019-06-28 ENCOUNTER — Encounter: Payer: Self-pay | Admitting: Family Medicine

## 2019-06-29 ENCOUNTER — Other Ambulatory Visit: Payer: Self-pay | Admitting: Family Medicine

## 2019-06-29 NOTE — Telephone Encounter (Signed)
Original Rx sent 06/22/19#30 w/ 3 refills but would like to request for 90d supply instead. Med pending for 90d supply, please advise.

## 2019-07-12 ENCOUNTER — Other Ambulatory Visit: Payer: Self-pay

## 2019-07-13 ENCOUNTER — Other Ambulatory Visit: Payer: Self-pay

## 2019-07-13 ENCOUNTER — Ambulatory Visit (INDEPENDENT_AMBULATORY_CARE_PROVIDER_SITE_OTHER): Payer: 59 | Admitting: Family Medicine

## 2019-07-13 ENCOUNTER — Encounter: Payer: Self-pay | Admitting: Family Medicine

## 2019-07-13 VITALS — BP 132/78 | HR 64 | Temp 98.3°F | Resp 16 | Ht 75.0 in | Wt 232.2 lb

## 2019-07-13 DIAGNOSIS — Z0000 Encounter for general adult medical examination without abnormal findings: Secondary | ICD-10-CM | POA: Diagnosis not present

## 2019-07-13 DIAGNOSIS — E7800 Pure hypercholesterolemia, unspecified: Secondary | ICD-10-CM | POA: Diagnosis not present

## 2019-07-13 MED ORDER — AMPHETAMINE-DEXTROAMPHET ER 30 MG PO CP24: ORAL_CAPSULE | ORAL | 0 refills | Status: DC

## 2019-07-13 NOTE — Patient Instructions (Signed)

## 2019-07-13 NOTE — Progress Notes (Signed)
Office Note 07/13/2019  CC:  Chief Complaint  Patient presents with  . Annual Exam    pt is fasting    HPI:  Andrew Stevenson is a 74 y.o. White male who is here for annual health maintenance exam.  Andrew Stevenson has adult ADD and uses adderall xr, 2 of the 30mg  tabs qAM most days. PMP AWARE reviewed today: most recent rx for adderall xr 30mg  was filled 05/24/19, # 35, rx by me. No red flags. CSC and UDS UTD (12/19/18).  Lumbar spine pain/spondyolosis: getting some injections via his specialist.  Pain meds managed by then. Diffuse LBP w/out radiculapthy.  BP usually normal at his other MD visits. He hurts in his low back more in the mornings (now).  Past Medical History:  Diagnosis Date  . Adult ADHD    adderall caused elevated bp  . Anxiety and depression    in past/ due to back injuries  . At risk for sleep apnea    STOP-BANG= 5      SENT TO PCP 11-05-2014  . Benign paroxysmal positional vertigo   . Branch retinal artery occlusion of right eye   . Chronic pain   . Depression   . Disequilibrium syndrome    Carotid dopplers and MRI brain NORMAL x 2 (including eighth nerve studies on most recent MRI 08/2016).  . Erectile dysfunction   . Heart murmur   . History of concussion    june 2015--  hit head w/ syncope---  no residual  . History of memory loss    per prior PCP records./ per pt due to medication  . History of syncope    june 2015 --syncope w/ fall and pt states has happened 5 more times , the last one being jan 2016,,  states had battery of test and unknown cause  . Hx of adenomatous colonic polyps 05/25/2017   Recall 05/2022  . Hyperlipidemia 02/2016   atorva 02/2016---lipid #s worse so changed to generic crestor.  Much improved on crestor.  . IFG (impaired fasting glucose) 02/2016   HbA1c 5 %.  . Insomnia   . Lower urinary tract symptoms (LUTS)   . Mild aortic stenosis    per echo valve area 1.78cm^2  . Myalgia and myositis, unspecified   . Osteoarthritis,  multiple sites    C-spine, L spine, knees, "all my joints basically"  . Prostate cancer [facility]) urologist-  Andrew Stevenson/  oncologist-  Andrew Stevenson   2016 Stage T1c,  Gleason 4+3,  PSA 5.26, vol 49.19cc  . RLS (restless legs syndrome)   . Spinal stenosis, lumbar region with neurogenic claudication    s/p L3-S1 fusion revision 09/2018. Pain worse 05/2019->MRI showed ankylosis + surg fusion, L2-3 being the only mobile level of lower spine.  . Thrombocytopenia (Andrew Stevenson)    >100K.  Mild, chronic  . Upper airway cough syndrome 2018   Andrew. Halford Stevenson.  Cough therapy maximized at f/u 07/2016; referred to ENT by Pulm.  Allergy testing OK. Improved as of 08/05/16 pulm f/u.  Marland Kitchen Vertebrobasilar insufficiency    per neurologist note Andrew Andrew Stevenson-- this is possible causing syncope-- no tx just be careful getting up from sitting position  . Wears glasses   . White coat syndrome without hypertension     Past Surgical History:  Procedure Laterality Date  . Carotid Dopplers  08/01/2014   NORMAL (Andrew. Felecia Stevenson)  . CERVICAL FUSION  last one 2010   2 times  . COLONOSCOPY  02/2004; 05/2017  2019: adenomatous polyp, int hem, diverticulosis, radiation proctitis.  Recall 05/2022.  Marland Kitchen EYE SURGERY Bilateral 2013   5 right eye, 3 left eye surgeries (including cataract extraction's iol w/ lens implant, other surgery's for post-op lens fungal infection and floaters)  . GOLD SEED IMPLANT N/A 02/22/2015   Procedure: GOLD SEED IMPLANT;  Surgeon: Andrew Rhodes, MD;  Location: [facility];  Service: Urology;  Laterality: N/A;  . LEFT THIGH QUADRICEP MUSCLE BIOPSY'S  12-31-2005  . LUMBAR FUSION  x2  last one 2010   4 surgeries in back per pt  . MRA/MRI w/out contrast--brain  12/2014   mild, age-related chronic microvascular ischemic change (Andrew. Felecia Stevenson).  Marland Kitchen RADIOACTIVE SEED IMPLANT N/A 05/17/2015   Procedure: RADIOACTIVE SEED IMPLANT/BRACHYTHERAPY IMPLANT;  Surgeon: Andrew Rhodes, MD;  Location: [facility];  Service:  Urology;  Laterality: N/A;  . TRANSRECTAL ULTRASOUND N/A 11/12/2014   Procedure: TRANSRECTAL ULTRASOUND AND BIOPSY  ;  Surgeon: Andrew Rhodes, MD;  Location: [facility];  Service: Urology;  Laterality: N/A;  . TRANSTHORACIC ECHOCARDIOGRAM  03-29-2014   mild concentric LVH/  ef [id number]  grade I diastolic dysfunction/  AV poorly visualized, possibly bicupid;  mild AS,  valve area 1.78cm^2,  mean grandient 99mm Hg/  trivial TR    Family History  Problem Relation Age of Onset  . Hypertension Mother   . Alcohol abuse Father   . Arthritis Father   . Lung cancer Father        smoker  . Diabetes Brother   . Alcohol abuse Maternal Grandfather   . Stroke Maternal Grandfather   . Cancer Paternal Grandmother   . Alcohol abuse Paternal Grandfather   . Lung cancer Brother        smoker    Social History   Socioeconomic History  . Marital status: Married    Spouse name: Not on file  . Number of children: Not on file  . Years of education: Not on file  . Highest education level: Not on file  Occupational History  . Occupation: UNEMPLOYED  Tobacco Use  . Smoking status: Never Smoker  . Smokeless tobacco: Former Systems developer    Types: Snuff  . Tobacco comment: using snuff x 25 years  Substance and Sexual Activity  . Alcohol use: Yes    Alcohol/week: 0.0 standard drinks    Comment: rare  . Drug use: No  . Sexual activity: Not on file  Other Topics Concern  . Not on file  Social History Narrative   Married, 1 daughter.   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   No tob.   Occ alcohol.   Social Determinants of Health   Financial Resource Strain:   . Difficulty of Paying Living Expenses:   Food Insecurity:   . Worried About Charity fundraiser in the Last Year:   . Arboriculturist in the Last Year:   Transportation Needs:   . Film/video editor (Medical):   Marland Kitchen Lack of Transportation (Non-Medical):   Physical Activity:   . Days  of Exercise per Week:   . Minutes of Exercise per Session:   Stress:   . Feeling of Stress :   Social Connections:   . Frequency of Communication with Friends and Family:   . Frequency of Social Gatherings with Friends and Family:   . Attends Religious Services:   . Active Member of Clubs or Organizations:   .  Attends Archivist Meetings:   Marland Kitchen Marital Status:   Intimate Partner Violence:   . Fear of Current or Ex-Partner:   . Emotionally Abused:   Marland Kitchen Physically Abused:   . Sexually Abused:     Outpatient Medications Prior to Visit  Medication Sig Dispense Refill  . amphetamine-dextroamphetamine (ADDERALL XR) 30 MG 24 hr capsule 2 caps po qAM 60 capsule 0  . b complex vitamins capsule Take 1 capsule by mouth daily.    . celecoxib (CELEBREX) 200 MG capsule     . CIALIS 5 MG tablet Take 1 tablet (5 mg total) by mouth daily. 10 tablet 11  . cyclobenzaprine (FLEXERIL) 10 MG tablet Take 10 mg by mouth 3 (three) times daily.    Marland Kitchen gabapentin (NEURONTIN) 600 MG tablet Take 600 mg by mouth 2 (two) times daily.     Marland Kitchen levofloxacin (LEVAQUIN) 500 MG tablet Take 1 tablet (500 mg total) by mouth daily. 14 tablet 0  . Melatonin 3 MG CAPS Take by mouth.    . Multiple Vitamin (MULTIVITAMIN) tablet Take 1 tablet by mouth daily.    Marland Kitchen rOPINIRole (REQUIP) 1 MG tablet Take 1 tablet (1 mg total) by mouth 3 (three) times daily. 270 tablet 3  . rosuvastatin (CRESTOR) 10 MG tablet TAKE 1 TABLET BY MOUTH EVERY DAY 90 tablet 0  . tiZANidine (ZANAFLEX) 4 MG capsule     . traZODone (DESYREL) 50 MG tablet TAKE 1 TO 2 TABLETS BY MOUTH AT BEDTIME AS NEEDED FOR INSOMNIA 180 tablet 3  . gabapentin (NEURONTIN) 300 MG capsule TAKE 1 CAPSULE BY MOUTH AT NIGHT X3 DAYS, THEN TWICE A DAY X3 DAYS THEN THREE TIMES A DAY  0  . HYDROcodone-acetaminophen (NORCO) 10-325 MG tablet Take 1 tablet by mouth every 4 (four) hours as needed.    Marland Kitchen oxyCODONE-acetaminophen (PERCOCET) 10-325 MG tablet Take 1 tablet by mouth every 6  (six) hours as needed.     No facility-administered medications prior to visit.    Allergies  Allergen Reactions  . Methylphenidate Palpitations    Pt reported; confirmed he wanted med added to his allergy list  . Oxycodone Itching and Other (See Comments)    Nightmares, hallucinations  . Penicillins Other (See Comments)    Unknown childhood reaction    ROS Review of Systems  Constitutional: Negative for appetite change, chills, fatigue and fever.  HENT: Negative for congestion, dental problem, ear pain and sore throat.   Eyes: Negative for discharge, redness and visual disturbance.  Respiratory: Negative for cough, chest tightness, shortness of breath and wheezing.   Cardiovascular: Negative for chest pain, palpitations and leg swelling.  Gastrointestinal: Negative for abdominal pain, blood in stool, diarrhea, nausea and vomiting.  Genitourinary: Negative for difficulty urinating, dysuria, flank pain, frequency, hematuria and urgency.  Musculoskeletal: Positive for back pain. Negative for arthralgias, joint swelling, myalgias and neck stiffness.  Skin: Negative for pallor and rash.  Neurological: Negative for dizziness, speech difficulty, weakness and headaches.  Hematological: Negative for adenopathy. Does not bruise/bleed easily.  Psychiatric/Behavioral: Negative for confusion and sleep disturbance. The patient is not nervous/anxious.    PE; Vitals with BMI 07/13/2019 01/20/2019 12/19/2018  Height 6\' 3"  6' 3.5" 6' 3.5"  Weight 232 lbs 3 oz 234 lbs 6 oz 217 lbs 10 oz  BMI 29.02 [id number] [id number]  Systolic [id number]  Diastolic 76 74 84  Pulse 64 67 72    Gen: Alert, well appearing.  Patient is oriented to person, place,  time, and situation. AFFECT: pleasant, lucid thought and speech. ENT: Ears: EACs clear, normal epithelium.  TMs with good light reflex and landmarks bilaterally.  Eyes: no injection, icteris, swelling, or exudate.  EOMI, PERRLA. Nose: no drainage or turbinate  edema/swelling.  No injection or focal lesion.  Mouth: lips without lesion/swelling.  Oral mucosa pink and moist.  Dentition intact and without obvious caries or gingival swelling.  Oropharynx without erythema, exudate, or swelling.  Neck: supple/nontender.  No LAD, mass, or TM.  Carotid pulses 2+ bilaterally, without bruits. CV: RRR, A999333 systolic murmur, no diastolic murmur, no r/g.   LUNGS: CTA bilat, nonlabored resps, good aeration in all lung fields. ABD: soft, NT, ND, BS normal.  No hepatospenomegaly or mass.  No bruits. EXT: no clubbing, cyanosis, or edema.  Musculoskeletal: no joint swelling, erythema, warmth, or tenderness.  ROM of all joints intact. Skin - no sores or suspicious lesions or rashes or color changes   Pertinent labs:  Lab Results  Component Value Date   TSH 2.00 02/27/2016   Lab Results  Component Value Date   WBC 5.0 12/19/2018   HGB 13.6 12/19/2018   HCT 41.6 12/19/2018   MCV 88.7 12/19/2018   PLT 117 (L) 12/19/2018   Lab Results  Component Value Date   CREATININE 0.74 12/19/2018   BUN 21 12/19/2018   NA 141 12/19/2018   K 4.3 12/19/2018   CL 109 12/19/2018   CO2 23 12/19/2018   Lab Results  Component Value Date   ALT 26 12/19/2018   AST 32 12/19/2018   ALKPHOS 70 09/26/2018   BILITOT 0.9 12/19/2018   Lab Results  Component Value Date   CHOL 118 05/13/2018   Lab Results  Component Value Date   HDL 46 05/13/2018   Lab Results  Component Value Date   LDLCALC 59 05/13/2018   Lab Results  Component Value Date   TRIG 54 05/13/2018   Lab Results  Component Value Date   CHOLHDL 2.6 05/13/2018   Lab Results  Component Value Date   PSA 0.03 (L) 12/19/2018   PSA 0.08 (L) 07/08/2017   Lab Results  Component Value Date   HGBA1C 5.0 02/27/2016   ASSESSMENT AND PLAN:   Health maintenance exam: Reviewed age and gender appropriate health maintenance issues (prudent diet, regular exercise, health risks of tobacco and excessive alcohol,  use of seatbelts, fire alarms in home, use of sunscreen).  Also reviewed age and gender appropriate health screening as well as vaccine recommendations. Vaccines: all UTD, including shingrix and covid 19. Labs: CBC w/diff, cmet, flp (hyperchol). Prostate ca screening: hx of prostate ca, followed by urology. Colon ca screening: hx of polyps, next colonoscopy due 2024.  Adult ADD: CSC and UDS UTD. Adderall xr 30mg , 2 qAM, #60 eRxd today for each of the next 3 months.  Fill on/after dates on rx's as appropriate.  An After Visit Summary was printed and given to the patient.  FOLLOW UP:  No follow-ups on file.  Signed:  Crissie Sickles, MD           07/13/2019

## 2019-07-14 LAB — COMPREHENSIVE METABOLIC PANEL
AG Ratio: 2 (calc) (ref 1.0–2.5)
ALT: 20 U/L (ref 9–46)
AST: 22 U/L (ref 10–35)
Albumin: 4 g/dL (ref 3.6–5.1)
Alkaline phosphatase (APISO): 57 U/L (ref 35–144)
BUN: 21 mg/dL (ref 7–25)
CO2: 28 mmol/L (ref 20–32)
Calcium: 9.1 mg/dL (ref 8.6–10.3)
Chloride: 106 mmol/L (ref 98–110)
Creat: 0.9 mg/dL (ref 0.70–1.18)
Globulin: 2 g/dL (calc) (ref 1.9–3.7)
Glucose, Bld: 97 mg/dL (ref 65–99)
Potassium: 5.2 mmol/L (ref 3.5–5.3)
Sodium: 143 mmol/L (ref 135–146)
Total Bilirubin: 1 mg/dL (ref 0.2–1.2)
Total Protein: 6 g/dL — ABNORMAL LOW (ref 6.1–8.1)

## 2019-07-14 LAB — CBC WITH DIFFERENTIAL/PLATELET
Absolute Monocytes: 506 cells/uL (ref 200–950)
Basophils Absolute: 22 cells/uL (ref 0–200)
Basophils Relative: 0.5 %
Eosinophils Absolute: 79 cells/uL (ref 15–500)
Eosinophils Relative: 1.8 %
HCT: 46.1 % (ref 38.5–50.0)
Hemoglobin: 14.6 g/dL (ref 13.2–17.1)
Lymphs Abs: 788 cells/uL — ABNORMAL LOW (ref 850–3900)
MCH: 29.9 pg (ref 27.0–33.0)
MCHC: 31.7 g/dL — ABNORMAL LOW (ref 32.0–36.0)
MCV: 94.5 fL (ref 80.0–100.0)
MPV: 12.1 fL (ref 7.5–12.5)
Monocytes Relative: 11.5 %
Neutro Abs: 3005 cells/uL (ref 1500–7800)
Neutrophils Relative %: 68.3 %
Platelets: 105 10*3/uL — ABNORMAL LOW (ref 140–400)
RBC: 4.88 10*6/uL (ref 4.20–5.80)
RDW: 13.3 % (ref 11.0–15.0)
Total Lymphocyte: 17.9 %
WBC: 4.4 10*3/uL (ref 3.8–10.8)

## 2019-07-14 LAB — LIPID PANEL
Cholesterol: 143 mg/dL (ref <200)
HDL: 59 mg/dL (ref >=40)
LDL Cholesterol (Calc): 66 mg/dL (calc)
Non-HDL Cholesterol (Calc): 84 mg/dL (calc) (ref <130)
Total CHOL/HDL Ratio: 2.4 (calc) (ref <5.0)
Triglycerides: 93 mg/dL (ref <150)

## 2019-07-20 ENCOUNTER — Telehealth: Payer: Self-pay

## 2019-07-20 NOTE — Telephone Encounter (Signed)
Patient request refill on vertigo meds. He doesn't know name of med  [facility] [place]

## 2019-07-21 MED ORDER — MECLIZINE HCL 25 MG PO TABS: ORAL_TABLET | ORAL | 3 refills | Status: DC

## 2019-07-21 NOTE — Telephone Encounter (Signed)
Ok, meclizine erx'd

## 2019-07-21 NOTE — Telephone Encounter (Signed)
Patient aware.

## 2019-07-21 NOTE — Telephone Encounter (Signed)
Do you know what medication he is talking about?  Please advise.

## 2019-08-15 ENCOUNTER — Telehealth: Payer: Self-pay

## 2019-08-15 NOTE — Telephone Encounter (Signed)
Pt was called and told to call pharmacy. Dr Jerilynn Mages sent in 3 months worth on 07/13/2019. Pt was advised to call back if there were any problems.

## 2019-08-15 NOTE — Telephone Encounter (Signed)
Patient called in needing a refill on his medication   amphetamine-dextroamphetamine (ADDERALL XR) 30 MG 24 hr capsule   [id number] - [place], [place] - [address] AT [address]

## 2019-08-29 ENCOUNTER — Other Ambulatory Visit: Payer: Self-pay | Admitting: Family Medicine

## 2019-09-21 ENCOUNTER — Ambulatory Visit: Payer: 59 | Admitting: Family Medicine

## 2019-09-21 DIAGNOSIS — Z0289 Encounter for other administrative examinations: Secondary | ICD-10-CM

## 2019-09-21 NOTE — Progress Notes (Deleted)
OFFICE VISIT  09/21/2019   CC: No chief complaint on file.    HPI:    Patient is a 74 y.o. Caucasian male who presents for "vertigo getting worse".   Past Medical History:  Diagnosis Date  . Adult ADHD    adderall caused elevated bp  . Anxiety and depression    in past/ due to back injuries  . At risk for sleep apnea    STOP-BANG= 5      SENT TO PCP 11-05-2014  . Benign paroxysmal positional vertigo   . Branch retinal artery occlusion of right eye   . Chronic pain   . Depression   . Disequilibrium syndrome    Carotid dopplers and MRI brain NORMAL x 2 (including eighth nerve studies on most recent MRI 08/2016).  . Erectile dysfunction   . Heart murmur   . History of concussion    june 2015--  hit head w/ syncope---  no residual  . History of memory loss    per prior PCP records./ per pt due to medication  . History of syncope    june 2015 --syncope w/ fall and pt states has happened 5 more times , the last one being jan 2016,,  states had battery of test and unknown cause  . Hx of adenomatous colonic polyps 05/25/2017   Recall 05/2022  . Hyperlipidemia 02/2016   atorva 02/2016---lipid #s worse so changed to generic crestor.  Much improved on crestor.  . IFG (impaired fasting glucose) 02/2016   HbA1c 5 %.  . Insomnia   . Lower urinary tract symptoms (LUTS)   . Mild aortic stenosis    per echo valve area 1.78cm^2  . Myalgia and myositis, unspecified   . Osteoarthritis, multiple sites    C-spine, L spine, knees, "all my joints basically"  . Prostate cancer [facility]) urologist-  dr ottelin/  oncologist-  dr Valere Dross   2016 Stage T1c,  Gleason 4+3,  PSA 5.26, vol 49.19cc  . RLS (restless legs syndrome)   . Spinal stenosis, lumbar region with neurogenic claudication    s/p L3-S1 fusion revision 09/2018. Pain worse 05/2019->MRI showed ankylosis + surg fusion, L2-3 being the only mobile level of lower spine.  . Thrombocytopenia ([place])    >100K.  Mild, chronic  . Upper airway cough  syndrome 2018   Dr. Halford Chessman.  Cough therapy maximized at f/u 07/2016; referred to ENT by Pulm.  Allergy testing OK. Improved as of 08/05/16 pulm f/u.  Marland Kitchen Vertebrobasilar insufficiency    per neurologist note dr Felecia Shelling-- this is possible causing syncope-- no tx just be careful getting up from sitting position  . Wears glasses   . White coat syndrome without hypertension     Past Surgical History:  Procedure Laterality Date  . Carotid Dopplers  08/01/2014   NORMAL (Dr. Felecia Shelling)  . CERVICAL FUSION  last one 2010   2 times  . COLONOSCOPY  02/2004; 05/2017   2019: adenomatous polyp, int hem, diverticulosis, radiation proctitis.  Recall 05/2022.  Marland Kitchen EYE SURGERY Bilateral 2013   5 right eye, 3 left eye surgeries (including cataract extraction's iol w/ lens implant, other surgery's for post-op lens fungal infection and floaters)  . GOLD SEED IMPLANT N/A 02/22/2015   Procedure: GOLD SEED IMPLANT;  Surgeon: Kathie Rhodes, MD;  Location[facility];  Service: Urology;  Laterality: N/A;  . LEFT THIGH QUADRICEP MUSCLE BIOPSY'S  12-31-2005  . LUMBAR FUSION  x2  last one 2010   4 surgeries  in back per pt  . MRA/MRI w/out contrast--brain  12/2014   mild, age-related chronic microvascular ischemic change (Dr. Felecia Shelling).  Marland Kitchen RADIOACTIVE SEED IMPLANT N/A 05/17/2015   Procedure: RADIOACTIVE SEED IMPLANT/BRACHYTHERAPY IMPLANT;  Surgeon: Kathie Rhodes, MD;  Location: [facility];  Service: Urology;  Laterality: N/A;  . TRANSRECTAL ULTRASOUND N/A 11/12/2014   Procedure: TRANSRECTAL ULTRASOUND AND BIOPSY  ;  Surgeon: Kathie Rhodes, MD;  Location: [facility];  Service: Urology;  Laterality: N/A;  . TRANSTHORACIC ECHOCARDIOGRAM  03-29-2014   mild concentric LVH/  ef 32-35%/  grade I diastolic dysfunction/  AV poorly visualized, possibly bicupid;  mild AS,  valve area 1.78cm^2,  mean grandient 42mm Hg/  trivial TR    Outpatient Medications Prior to Visit  Medication Sig Dispense Refill   . amphetamine-dextroamphetamine (ADDERALL XR) 30 MG 24 hr capsule 2 caps po qAM 60 capsule 0  . b complex vitamins capsule Take 1 capsule by mouth daily.    . celecoxib (CELEBREX) 200 MG capsule     . CIALIS 5 MG tablet Take 1 tablet (5 mg total) by mouth daily. 10 tablet 11  . cyclobenzaprine (FLEXERIL) 10 MG tablet Take 10 mg by mouth 3 (three) times daily.    Marland Kitchen gabapentin (NEURONTIN) 600 MG tablet Take 600 mg by mouth 2 (two) times daily.     Marland Kitchen levofloxacin (LEVAQUIN) 500 MG tablet Take 1 tablet (500 mg total) by mouth daily. 14 tablet 0  . meclizine (ANTIVERT) 25 MG tablet TAKE 1 TABLET BY MOUTH 3 TIMES DAILY AS NEEDED FOR DIZZINESS. 90 tablet 3  . Melatonin 3 MG CAPS Take by mouth.    . Multiple Vitamin (MULTIVITAMIN) tablet Take 1 tablet by mouth daily.    Marland Kitchen rOPINIRole (REQUIP) 1 MG tablet Take 1 tablet (1 mg total) by mouth 3 (three) times daily. 270 tablet 3  . rosuvastatin (CRESTOR) 10 MG tablet TAKE 1 TABLET BY MOUTH EVERY DAY 90 tablet 0  . tiZANidine (ZANAFLEX) 4 MG capsule     . traZODone (DESYREL) 50 MG tablet TAKE 1 TO 2 TABLETS BY MOUTH AT BEDTIME AS NEEDED FOR INSOMNIA 180 tablet 3   No facility-administered medications prior to visit.    Allergies  Allergen Reactions  . Methylphenidate Palpitations    Pt reported; confirmed he wanted med added to his allergy list  . Oxycodone Itching and Other (See Comments)    Nightmares, hallucinations  . Penicillins Other (See Comments)    Unknown childhood reaction    ROS As per HPI  PE: There were no vitals taken for this visit. ***  LABS:    Chemistry      Component Value Date/Time   NA 143 07/13/2019 0826   NA 141 09/26/2018 0000   K 5.2 07/13/2019 0826   CL 106 07/13/2019 0826   CO2 28 07/13/2019 0826   BUN 21 07/13/2019 0826   BUN 20 09/26/2018 0000   CREATININE 0.90 07/13/2019 0826   GLU 95 09/26/2018 0000      Component Value Date/Time   CALCIUM 9.1 07/13/2019 0826   ALKPHOS 70 09/26/2018 0000   AST  22 07/13/2019 0826   ALT 20 07/13/2019 0826   BILITOT 1.0 07/13/2019 0826     Lab Results  Component Value Date   WBC 4.4 07/13/2019   HGB 14.6 07/13/2019   HCT 46.1 07/13/2019   MCV 94.5 07/13/2019   PLT 105 (L) 07/13/2019   01/02/15 MR angio head w/ou contrast (Dr.  Sater): IMPRESSION:  This MR angiogram does not show any significant stenosis and no aneurysms. Both posterior cerebral arteries have a fetal origin with prominent posterior communicating arteries. 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  MR brain w/out contrast 01/02/15 (Dr. Felecia Shelling) IMPRESSION:  This is an age-appropriate MRI of the brain without contrast showing mild cortical atrophy and a few scattered foci consistent with mild age-related chronic microvascular ischemic change.   There are no acute findings.   IMPRESSION AND PLAN:  No problem-specific Assessment & Plan notes found for this encounter.   An After Visit Summary was printed and given to the patient.  FOLLOW UP: No follow-ups on file.  Signed:  Crissie Sickles, MD           09/21/2019

## 2019-10-26 ENCOUNTER — Ambulatory Visit (INDEPENDENT_AMBULATORY_CARE_PROVIDER_SITE_OTHER): Payer: 59

## 2019-10-26 ENCOUNTER — Ambulatory Visit: Payer: Self-pay | Admitting: Podiatry

## 2019-10-26 ENCOUNTER — Other Ambulatory Visit: Payer: Self-pay

## 2019-10-26 DIAGNOSIS — M79671 Pain in right foot: Secondary | ICD-10-CM

## 2019-10-26 DIAGNOSIS — M722 Plantar fascial fibromatosis: Secondary | ICD-10-CM

## 2019-10-26 NOTE — Patient Instructions (Signed)

## 2019-10-27 NOTE — Progress Notes (Signed)
Subjective:   Patient ID: Andrew Stevenson, male   DOB: 74 y.o.   MRN: [id number]   HPI 74 year old male presents the office today for concerns of right heel pain which is been ongoing for 2 to 3 months. Describes pain in the bottom of his heel but in the arch of the foot. He said no recent injury or trauma. No significant swelling there is no redness. No radiating pain or weakness. He has a history of what seems to be a plantar fascial rupture on the left side several years ago.   Review of Systems  All other systems reviewed and are negative.  Past Medical History:  Diagnosis Date  . Adult ADHD    adderall caused elevated bp  . Anxiety and depression    in past/ due to back injuries  . At risk for sleep apnea    STOP-BANG= 5      SENT TO PCP 11-05-2014  . Benign paroxysmal positional vertigo   . Branch retinal artery occlusion of right eye   . Chronic pain   . Depression   . Disequilibrium syndrome    Carotid dopplers and MRI brain NORMAL x 2 (including eighth nerve studies on most recent MRI 08/2016).  . Erectile dysfunction   . Heart murmur   . History of concussion    june 2015--  hit head w/ syncope---  no residual  . History of memory loss    per prior PCP records./ per pt due to medication  . History of syncope    june 2015 --syncope w/ fall and pt states has happened 5 more times , the last one being jan 2016,,  states had battery of test and unknown cause  . Hx of adenomatous colonic polyps 05/25/2017   Recall 05/2022  . Hyperlipidemia 02/2016   atorva 02/2016---lipid #s worse so changed to generic crestor.  Much improved on crestor.  . IFG (impaired fasting glucose) 02/2016   HbA1c 5 %.  . Insomnia   . Lower urinary tract symptoms (LUTS)   . Mild aortic stenosis    per echo valve area 1.78cm^2  . Myalgia and myositis, unspecified   . Osteoarthritis, multiple sites    C-spine, L spine, knees, "all my joints basically"  . Prostate cancer [facility]) urologist-  dr ottelin/   oncologist-  dr Valere Dross   2016 Stage T1c,  Gleason 4+3,  PSA 5.26, vol 49.19cc  . RLS (restless legs syndrome)   . Spinal stenosis, lumbar region with neurogenic claudication    s/p L3-S1 fusion revision 09/2018. Pain worse 05/2019->MRI showed ankylosis + surg fusion, L2-3 being the only mobile level of lower spine.  . Thrombocytopenia ([place])    >100K.  Mild, chronic  . Upper airway cough syndrome 2018   Dr. Halford Chessman.  Cough therapy maximized at f/u 07/2016; referred to ENT by Pulm.  Allergy testing OK. Improved as of 08/05/16 pulm f/u.  Marland Kitchen Vertebrobasilar insufficiency    per neurologist note dr Felecia Shelling-- this is possible causing syncope-- no tx just be careful getting up from sitting position  . Wears glasses   . White coat syndrome without hypertension     Past Surgical History:  Procedure Laterality Date  . Carotid Dopplers  08/01/2014   NORMAL (Dr. Felecia Shelling)  . CERVICAL FUSION  last one 2010   2 times  . COLONOSCOPY  02/2004; 05/2017   2019: adenomatous polyp, int hem, diverticulosis, radiation proctitis.  Recall 05/2022.  Marland Kitchen EYE SURGERY Bilateral 2013  5 right eye, 3 left eye surgeries (including cataract extraction's iol w/ lens implant, other surgery's for post-op lens fungal infection and floaters)  . GOLD SEED IMPLANT N/A 02/22/2015   Procedure: GOLD SEED IMPLANT;  Surgeon: Kathie Rhodes, MD;  Location: [facility];  Service: Urology;  Laterality: N/A;  . LEFT THIGH QUADRICEP MUSCLE BIOPSY'S  12-31-2005  . LUMBAR FUSION  x2  last one 2010   4 surgeries in back per pt  . MRA/MRI w/out contrast--brain  12/2014   mild, age-related chronic microvascular ischemic change (Dr. Felecia Shelling).  Marland Kitchen RADIOACTIVE SEED IMPLANT N/A 05/17/2015   Procedure: RADIOACTIVE SEED IMPLANT/BRACHYTHERAPY IMPLANT;  Surgeon: Kathie Rhodes, MD;  Location: [place];  Service: Urology;  Laterality: N/A;  . TRANSRECTAL ULTRASOUND N/A 11/12/2014   Procedure: TRANSRECTAL ULTRASOUND AND BIOPSY  ;   Surgeon: Kathie Rhodes, MD;  Location: [facility];  Service: Urology;  Laterality: N/A;  . TRANSTHORACIC ECHOCARDIOGRAM  03-29-2014   mild concentric LVH/  ef 70-01%/  grade I diastolic dysfunction/  AV poorly visualized, possibly bicupid;  mild AS,  valve area 1.78cm^2,  mean grandient 66mm Hg/  trivial TR     Current Outpatient Medications:  .  amphetamine-dextroamphetamine (ADDERALL XR) 30 MG 24 hr capsule, 2 caps po qAM, Disp: 60 capsule, Rfl: 0 .  b complex vitamins capsule, Take 1 capsule by mouth daily., Disp: , Rfl:  .  celecoxib (CELEBREX) 200 MG capsule, , Disp: , Rfl:  .  CIALIS 5 MG tablet, Take 1 tablet (5 mg total) by mouth daily., Disp: 10 tablet, Rfl: 11 .  cyclobenzaprine (FLEXERIL) 10 MG tablet, Take 10 mg by mouth 3 (three) times daily., Disp: , Rfl:  .  gabapentin (NEURONTIN) 600 MG tablet, Take 600 mg by mouth 2 (two) times daily. , Disp: , Rfl:  .  levofloxacin (LEVAQUIN) 500 MG tablet, Take 1 tablet (500 mg total) by mouth daily., Disp: 14 tablet, Rfl: 0 .  meclizine (ANTIVERT) 25 MG tablet, TAKE 1 TABLET BY MOUTH 3 TIMES DAILY AS NEEDED FOR DIZZINESS., Disp: 90 tablet, Rfl: 3 .  Melatonin 3 MG CAPS, Take by mouth., Disp: , Rfl:  .  Multiple Vitamin (MULTIVITAMIN) tablet, Take 1 tablet by mouth daily., Disp: , Rfl:  .  rOPINIRole (REQUIP) 1 MG tablet, Take 1 tablet (1 mg total) by mouth 3 (three) times daily., Disp: 270 tablet, Rfl: 3 .  rosuvastatin (CRESTOR) 10 MG tablet, TAKE 1 TABLET BY MOUTH EVERY DAY, Disp: 90 tablet, Rfl: 0 .  tiZANidine (ZANAFLEX) 4 MG capsule, , Disp: , Rfl:  .  traZODone (DESYREL) 50 MG tablet, TAKE 1 TO 2 TABLETS BY MOUTH AT BEDTIME AS NEEDED FOR INSOMNIA, Disp: 180 tablet, Rfl: 3  Allergies  Allergen Reactions  . Methylphenidate Palpitations    Pt reported; confirmed he wanted med added to his allergy list  . Oxycodone Itching and Other (See Comments)    Nightmares, hallucinations  . Penicillins Other (See Comments)     Unknown childhood reaction          Objective:  Physical Exam  General: AAO x3, NAD  Dermatological: Skin is warm, dry and supple bilateral. There are no open sores, no preulcerative lesions, no rash or signs of infection present.  Vascular: Dorsalis Pedis artery and Posterior Tibial artery pedal pulses are 2/4 bilateral with immedate capillary fill time. There is no pain with calf compression, swelling, warmth, erythema.   Neruologic: Grossly intact via light touch bilateral.  Negative tinel sign.   Musculoskeletal: Tenderness to palpation along the plantar medial tubercle of the calcaneus at the insertion of plantar fascia on the right foot. There is no pain along the course of the plantar fascia within the arch of the foot. Plantar fascia appears to be intact. There is no pain with lateral compression of the calcaneus or pain with vibratory sensation. There is no pain along the course or insertion of the achilles tendon. No other areas of tenderness to bilateral lower extremities. Muscular strength 5/5 in all groups tested bilateral.  Gait: Unassisted, Nonantalgic.      Assessment:   74 year old male right heel pain, plantar fasciitis  Plan:  -Treatment options discussed including all alternatives, risks, and complications -Etiology of symptoms were discussed -X-rays were obtained and reviewed with the patient. No evidence of acute fracture identified. Calcification in the plantar fascial also noted. No spur. -Steroid injection performed. See procedure note below -Plantar fascial brace dispensed -Prescribed mobic. Discussed side effects of the medication and directed to stop if any are to occur and call the office.  -Stretching/icing daily -Discussed shoe modifications and orthotics. He will follow-up with Liliane Channel for this  Procedure: Injection Tendon/Ligament Discussed alternatives, risks, complications and verbal consent was obtained.  Location: Right plantar fascia at the  glabrous junction; medial approach. Skin Prep: Alcohol  Injectate: 0.5cc 0.5% marcaine plain, 0.5 cc 2% lidocaine plain and, 1 cc kenalog 10. Disposition: Patient tolerated procedure well. Injection site dressed with a band-aid.  Post-injection care was discussed and return precautions discussed.   Return in about 4 weeks (around 11/23/2019).  Trula Slade DPM

## 2019-11-01 ENCOUNTER — Ambulatory Visit: Payer: 59 | Admitting: Podiatry

## 2019-11-15 ENCOUNTER — Telehealth: Payer: Self-pay

## 2019-11-15 MED ORDER — AMPHETAMINE-DEXTROAMPHET ER 30 MG PO CP24: ORAL_CAPSULE | ORAL | 0 refills | Status: DC

## 2019-11-15 NOTE — Telephone Encounter (Signed)
OK adderall eRxd 

## 2019-11-15 NOTE — Telephone Encounter (Signed)
Left detailed message advising refill sent, okay per dpr. 

## 2019-11-15 NOTE — Telephone Encounter (Signed)
RF request for adderall 30mg . Last OV 07/13/19 No upcoming appt.  ( patient did miss appt on 09/21/19 ) Last RX stated to fill on or after 09/10/19 # 60.  Please advise.

## 2019-11-15 NOTE — Telephone Encounter (Signed)
Patient refill request  amphetamine-dextroamphetamine (ADDERALL XR) 30 MG 24 hr capsule [[id number]]    [facility]

## 2019-11-22 LAB — PSA: PSA: 0.021

## 2019-12-04 ENCOUNTER — Other Ambulatory Visit: Payer: 59 | Admitting: Orthotics

## 2019-12-04 ENCOUNTER — Ambulatory Visit: Payer: 59 | Admitting: Podiatry

## 2019-12-28 ENCOUNTER — Other Ambulatory Visit: Payer: Self-pay

## 2019-12-28 MED ORDER — AMPHETAMINE-DEXTROAMPHET ER 30 MG PO CP24: ORAL_CAPSULE | ORAL | 0 refills | Status: DC

## 2019-12-28 NOTE — Telephone Encounter (Signed)
I'll do #30 of his adderall but he needs o/v prior to any further RFs.-thx

## 2019-12-28 NOTE — Telephone Encounter (Signed)
Patient made aware refill submitted. Appt scheduled for 10/29

## 2019-12-28 NOTE — Telephone Encounter (Signed)
Patient refill request  amphetamine-dextroamphetamine (ADDERALL XR) 30 MG 24 hr capsule [[id number]]    [facility]

## 2019-12-28 NOTE — Telephone Encounter (Signed)
Requesting: Adderall XR Contract:12/19/18 UDS:12/19/18 Last Visit:07/13/19 Next Visit:advised to f/u 6 months Last Refill:11/15/19(60,0)  Please Advise. Medication pending

## 2020-01-03 ENCOUNTER — Telehealth: Payer: Self-pay | Admitting: Family Medicine

## 2020-01-03 NOTE — Telephone Encounter (Signed)
Patient has appt next Friday, 10/29 for his vertigo followup. He states it has gotten worse and would like to know if Dr. Anitra Lauth can work him in sooner in the week (there are currently no open appts before Friday).

## 2020-01-03 NOTE — Telephone Encounter (Signed)
Please advise, thanks.

## 2020-01-06 ENCOUNTER — Encounter: Payer: Self-pay | Admitting: Family Medicine

## 2020-01-12 ENCOUNTER — Ambulatory Visit: Payer: 59 | Admitting: Family Medicine

## 2020-01-12 NOTE — Progress Notes (Deleted)
OFFICE VISIT  01/12/2020  CC: No chief complaint on file.   HPI:    Patient is a 74 y.o. Caucasian male who presents for 6 mo f/u adult ADD, HLD, insomnia, and disequilibrium syndrome.  A/P as of last visit: "Health maintenance exam: Reviewed age and gender appropriate health maintenance issues (prudent diet, regular exercise, health risks of tobacco and excessive alcohol, use of seatbelts, fire alarms in home, use of sunscreen).  Also reviewed age and gender appropriate health screening as well as vaccine recommendations. Vaccines: all UTD, including shingrix and covid 19. Labs: CBC w/diff, cmet, flp (hyperchol). Prostate ca screening: hx of prostate ca, followed by urology. Colon ca screening: hx of polyps, next colonoscopy due 2024.  Adult ADD: CSC and UDS UTD. Adderall xr 30mg , 2 qAM, #60 eRxd today for each of the next 3 months.  Fill on/after dates on rx's as appropriate."  INTERIM HX: *** Recent worsening of disequilibrium problem.   PMP AWARE reviewed today: most recent rx for *** was filled ***, # ***, rx by ***. No red flags.   Past Medical History:  Diagnosis Date  . Adult ADHD    adderall caused elevated bp  . Anxiety and depression    in past/ due to back injuries  . At risk for sleep apnea    STOP-BANG= 5      SENT TO PCP 11-05-2014  . Benign paroxysmal positional vertigo   . Branch retinal artery occlusion of right eye   . Chronic pain   . Depression   . Disequilibrium syndrome    Carotid dopplers and MRI brain NORMAL x 2 (including eighth nerve studies on most recent MRI 08/2016).  . Erectile dysfunction   . Heart murmur   . History of concussion    june 2015--  hit head w/ syncope---  no residual  . History of memory loss    per prior PCP records./ per pt due to medication  . History of syncope    june 2015 --syncope w/ fall and pt states has happened 5 more times , the last one being jan 2016,,  states had battery of test and unknown cause  . Hx  of adenomatous colonic polyps 05/25/2017   Recall 05/2022  . Hyperlipidemia 02/2016   atorva 02/2016---lipid #s worse so changed to generic crestor.  Much improved on crestor.  . IFG (impaired fasting glucose) 02/2016   HbA1c 5 %.  . Insomnia   . Mild aortic stenosis    per echo valve area 1.78cm^2  . Myalgia and myositis, unspecified   . OAB (overactive bladder)   . Osteoarthritis, multiple sites    C-spine, L spine, knees, "all my joints basically"  . Prostate cancer [facility]) urologist-  dr ottelin/  oncologist-  dr Valere Dross   2016 Stage T1c,  Gleason 4+3,  PSA 5.26, vol 49.19cc.  No biochemical recurrence as of 11/2019.  Marland Kitchen RLS (restless legs syndrome)   . Spinal stenosis, lumbar region with neurogenic claudication    s/p L3-S1 fusion revision 09/2018. Pain worse 05/2019->MRI showed ankylosis + surg fusion, L2-3 being the only mobile level of lower spine.  . Thrombocytopenia ([place])    >100K.  Mild, chronic  . Upper airway cough syndrome 2018   Dr. Halford Chessman.  Cough therapy maximized at f/u 07/2016; referred to ENT by Pulm.  Allergy testing OK. Improved as of 08/05/16 pulm f/u.  Marland Kitchen Vertebrobasilar insufficiency    per neurologist note dr Felecia Shelling-- this is possible causing syncope-- no tx  just be careful getting up from sitting position  . Wears glasses   . White coat syndrome without hypertension     Past Surgical History:  Procedure Laterality Date  . Carotid Dopplers  08/01/2014   NORMAL (Dr. Felecia Shelling)  . CERVICAL FUSION  last one 2010   2 times  . COLONOSCOPY  02/2004; 05/2017   2019: adenomatous polyp, int hem, diverticulosis, radiation proctitis.  Recall 05/2022.  Marland Kitchen EYE SURGERY Bilateral 2013   5 right eye, 3 left eye surgeries (including cataract extraction's iol w/ lens implant, other surgery's for post-op lens fungal infection and floaters)  . GOLD SEED IMPLANT N/A 02/22/2015   Procedure: GOLD SEED IMPLANT;  Surgeon: Kathie Rhodes, MD;  Location: [facility];  Service: Urology;   Laterality: N/A;  . LEFT THIGH QUADRICEP MUSCLE BIOPSY'S  12-31-2005  . LUMBAR FUSION  x2  last one 2010   4 surgeries in back per pt  . MRA/MRI w/out contrast--brain  12/2014   mild, age-related chronic microvascular ischemic change (Dr. Felecia Shelling).  Marland Kitchen RADIOACTIVE SEED IMPLANT N/A 05/17/2015   Procedure: RADIOACTIVE SEED IMPLANT/BRACHYTHERAPY IMPLANT;  Surgeon: Kathie Rhodes, MD;  Location: [place];  Service: Urology;  Laterality: N/A;  . TRANSRECTAL ULTRASOUND N/A 11/12/2014   Procedure: TRANSRECTAL ULTRASOUND AND BIOPSY  ;  Surgeon: Kathie Rhodes, MD;  Location: [facility];  Service: Urology;  Laterality: N/A;  . TRANSTHORACIC ECHOCARDIOGRAM  03-29-2014   mild concentric LVH/  ef 19-41%/  grade I diastolic dysfunction/  AV poorly visualized, possibly bicupid;  mild AS,  valve area 1.78cm^2,  mean grandient 59mm Hg/  trivial TR    Outpatient Medications Prior to Visit  Medication Sig Dispense Refill  . amphetamine-dextroamphetamine (ADDERALL XR) 30 MG 24 hr capsule 2 caps po qAM 60 capsule 0  . b complex vitamins capsule Take 1 capsule by mouth daily.    . celecoxib (CELEBREX) 200 MG capsule     . CIALIS 5 MG tablet Take 1 tablet (5 mg total) by mouth daily. 10 tablet 11  . cyclobenzaprine (FLEXERIL) 10 MG tablet Take 10 mg by mouth 3 (three) times daily.    Marland Kitchen gabapentin (NEURONTIN) 600 MG tablet Take 600 mg by mouth 2 (two) times daily.     Marland Kitchen levofloxacin (LEVAQUIN) 500 MG tablet Take 1 tablet (500 mg total) by mouth daily. 14 tablet 0  . meclizine (ANTIVERT) 25 MG tablet TAKE 1 TABLET BY MOUTH 3 TIMES DAILY AS NEEDED FOR DIZZINESS. 90 tablet 3  . Melatonin 3 MG CAPS Take by mouth.    . Multiple Vitamin (MULTIVITAMIN) tablet Take 1 tablet by mouth daily.    Marland Kitchen rOPINIRole (REQUIP) 1 MG tablet Take 1 tablet (1 mg total) by mouth 3 (three) times daily. 270 tablet 3  . rosuvastatin (CRESTOR) 10 MG tablet TAKE 1 TABLET BY MOUTH EVERY DAY 90 tablet 0  . tiZANidine  (ZANAFLEX) 4 MG capsule     . traZODone (DESYREL) 50 MG tablet TAKE 1 TO 2 TABLETS BY MOUTH AT BEDTIME AS NEEDED FOR INSOMNIA 180 tablet 3   No facility-administered medications prior to visit.    Allergies  Allergen Reactions  . Methylphenidate Palpitations    Pt reported; confirmed he wanted med added to his allergy list  . Oxycodone Itching and Other (See Comments)    Nightmares, hallucinations  . Penicillins Other (See Comments)    Unknown childhood reaction    ROS As per HPI  PE: Vitals with BMI  07/13/2019 07/13/2019 01/20/2019  Height - 6\' 3"  6' 3.5"  Weight - 232 lbs 3 oz 234 lbs 6 oz  BMI - 27.06 23.7  Systolic 628 315 176  Diastolic 78 76 74  Pulse - 64 67     ***  LABS:  Lab Results  Component Value Date   TSH 2.00 02/27/2016   Lab Results  Component Value Date   WBC 4.4 07/13/2019   HGB 14.6 07/13/2019   HCT 46.1 07/13/2019   MCV 94.5 07/13/2019   PLT 105 (L) 07/13/2019   Lab Results  Component Value Date   VITAMINB12 382 07/06/2014    Lab Results  Component Value Date   CREATININE 0.90 07/13/2019   BUN 21 07/13/2019   NA 143 07/13/2019   K 5.2 07/13/2019   CL 106 07/13/2019   CO2 28 07/13/2019   Lab Results  Component Value Date   ALT 20 07/13/2019   AST 22 07/13/2019   ALKPHOS 70 09/26/2018   BILITOT 1.0 07/13/2019   Lab Results  Component Value Date   CHOL 143 07/13/2019   Lab Results  Component Value Date   HDL 59 07/13/2019   Lab Results  Component Value Date   LDLCALC 66 07/13/2019   Lab Results  Component Value Date   TRIG 93 07/13/2019   Lab Results  Component Value Date   CHOLHDL 2.4 07/13/2019   Lab Results  Component Value Date   PSA 0.03 (L) 12/19/2018   PSA 0.08 (L) 07/08/2017   Lab Results  Component Value Date   HGBA1C 5.0 02/27/2016   IMPRESSION AND PLAN:  No problem-specific Assessment & Plan notes found for this encounter.  ?cbc (thrombocytop) ?FLP, ?cmet  An After Visit Summary was printed  and given to the patient.  FOLLOW UP: No follow-ups on file.  Signed:  Crissie Sickles, MD           01/12/2020

## 2020-01-24 ENCOUNTER — Ambulatory Visit (INDEPENDENT_AMBULATORY_CARE_PROVIDER_SITE_OTHER): Payer: 59 | Admitting: Family Medicine

## 2020-01-24 ENCOUNTER — Encounter: Payer: Self-pay | Admitting: Family Medicine

## 2020-01-24 ENCOUNTER — Other Ambulatory Visit: Payer: Self-pay

## 2020-01-24 VITALS — BP 113/65 | HR 62 | Temp 97.8°F | Resp 16 | Ht 75.0 in | Wt 236.6 lb

## 2020-01-24 DIAGNOSIS — I350 Nonrheumatic aortic (valve) stenosis: Secondary | ICD-10-CM | POA: Diagnosis not present

## 2020-01-24 DIAGNOSIS — D696 Thrombocytopenia, unspecified: Secondary | ICD-10-CM

## 2020-01-24 DIAGNOSIS — E7800 Pure hypercholesterolemia, unspecified: Secondary | ICD-10-CM

## 2020-01-24 DIAGNOSIS — R42 Dizziness and giddiness: Secondary | ICD-10-CM

## 2020-01-24 DIAGNOSIS — G2581 Restless legs syndrome: Secondary | ICD-10-CM

## 2020-01-24 DIAGNOSIS — F5101 Primary insomnia: Secondary | ICD-10-CM | POA: Diagnosis not present

## 2020-01-24 DIAGNOSIS — E878 Other disorders of electrolyte and fluid balance, not elsewhere classified: Secondary | ICD-10-CM | POA: Diagnosis not present

## 2020-01-24 DIAGNOSIS — Z23 Encounter for immunization: Secondary | ICD-10-CM | POA: Diagnosis not present

## 2020-01-24 DIAGNOSIS — G4719 Other hypersomnia: Secondary | ICD-10-CM

## 2020-01-24 DIAGNOSIS — F988 Other specified behavioral and emotional disorders with onset usually occurring in childhood and adolescence: Secondary | ICD-10-CM

## 2020-01-24 MED ORDER — AMPHETAMINE-DEXTROAMPHET ER 30 MG PO CP24: ORAL_CAPSULE | ORAL | 0 refills | Status: DC

## 2020-01-24 MED ORDER — ROSUVASTATIN CALCIUM 10 MG PO TABS: 10.0000 mg | ORAL_TABLET | Freq: Every day | ORAL | 3 refills | Status: DC

## 2020-01-24 NOTE — Progress Notes (Signed)
OFFICE VISIT  01/24/2020  CC:  Chief Complaint  Patient presents with  . Follow-up    RCI, pt is not fasting   HPI:    Patient is a 74 y.o. Caucasian male who presents for 6 mo f/u HLD, adult ADD, hx of disequilibrium syndrome/vertigo, RLS, insomnia, and chronic thrombocytopenia. FLP, CMET, CBC all normal 6 mo ago.  Still lots of feeling of disequilibrium, consistently much more of an issue the last month or so.  Has constant feeling of disequilibrium endorses sensation of spinning/movement, veering when walking sometimes, seems to acutely worsen "randomly", not clearly worse with body position but says it is worse when up and around doing activity/walking vs sitting still.  No visual abnormalities, no HAs, no ringing in ears, no hearing deficit/changes.  Has some nausea but no vomiting when dizziness gets bad.  Occ presyncope-type feeling but not daily.  Home bp's always normal. RLS well controlled as long as he takes ropinirole. Sleeps pretty well on 1-2 trazodone every night. +Daytime sleepiness is new, +snores, unknown if apneic spells.  HLD: tolerating statin, +compliant.   Pt states all is going well with the med at current dosing: much improved focus, concentration, task completion.  Less frustration, better multitasking, less impulsivity and restlessness.  Mood is stable. No side effects from the medication.  ROS: as per HPI, plus-> no fevers, no CP, no SOB, no wheezing, no cough, no rashes, no melena/hematochezia.  No polyuria or polydipsia.  No myalgias or arthralgias.  No focal weakness, paresthesias, or tremors.  No acute vision or hearing abnormalities.  No nosebleeds, excessive bruising, or blood in urine. No n/v/d or abd pain.  No palpitations.    PMP AWARE reviewed today: most recent rx for adderall xr 30mg  was filled 12/28/19, # 4, rx by me. No red flags.  Past Medical History:  Diagnosis Date  . Adult ADHD    adderall caused elevated bp  . Anxiety and  depression    in past/ due to back injuries  . At risk for sleep apnea    STOP-BANG= 5      SENT TO PCP 11-05-2014  . Benign paroxysmal positional vertigo   . Branch retinal artery occlusion of right eye   . Chronic pain   . Depression   . Disequilibrium syndrome    Carotid dopplers and MRI brain NORMAL x 2 (including eighth nerve studies on most recent MRI 08/2016).  . Erectile dysfunction   . Heart murmur   . History of concussion    june 2015--  hit head w/ syncope---  no residual  . History of memory loss    per prior PCP records./ per pt due to medication  . History of syncope    june 2015 --syncope w/ fall and pt states has happened 5 more times , the last one being jan 2016,,  states had battery of test and unknown cause  . Hx of adenomatous colonic polyps 05/25/2017   Recall 05/2022  . Hyperlipidemia 02/2016   atorva 02/2016---lipid #s worse so changed to generic crestor.  Much improved on crestor.  . IFG (impaired fasting glucose) 02/2016   HbA1c 5 %.  . Insomnia   . Mild aortic stenosis    per echo valve area 1.78cm^2  . Myalgia and myositis, unspecified   . OAB (overactive bladder)   . Osteoarthritis, multiple sites    C-spine, L spine, knees, "all my joints basically"  . Prostate cancer [facility]) urologist-  dr ottelin/  oncologist-  dr Valere Dross   2016 Stage T1c,  Gleason 4+3,  PSA 5.26, vol 49.19cc.  No biochemical recurrence as of 11/2019.  Marland Kitchen RLS (restless legs syndrome)   . Spinal stenosis, lumbar region with neurogenic claudication    s/p L3-S1 fusion revision 09/2018. Pain worse 05/2019->MRI showed ankylosis + surg fusion, L2-3 being the only mobile level of lower spine.  . Thrombocytopenia ([place])    >100K.  Mild, chronic  . Upper airway cough syndrome 2018   Dr. Halford Chessman.  Cough therapy maximized at f/u 07/2016; referred to ENT by Pulm.  Allergy testing OK. Improved as of 08/05/16 pulm f/u.  Marland Kitchen Vertebrobasilar insufficiency    per neurologist note dr Felecia Shelling-- this is possible  causing syncope-- no tx just be careful getting up from sitting position  . Wears glasses   . White coat syndrome without hypertension     Past Surgical History:  Procedure Laterality Date  . Carotid Dopplers  08/01/2014   NORMAL (Dr. Felecia Shelling)  . CERVICAL FUSION  last one 2010   2 times  . COLONOSCOPY  02/2004; 05/2017   2019: adenomatous polyp, int hem, diverticulosis, radiation proctitis.  Recall 05/2022.  Marland Kitchen EYE SURGERY Bilateral 2013   5 right eye, 3 left eye surgeries (including cataract extraction's iol w/ lens implant, other surgery's for post-op lens fungal infection and floaters)  . GOLD SEED IMPLANT N/A 02/22/2015   Procedure: GOLD SEED IMPLANT;  Surgeon: Kathie Rhodes, MD;  Location: [facility];  Service: Urology;  Laterality: N/A;  . LEFT THIGH QUADRICEP MUSCLE BIOPSY'S  12-31-2005  . LUMBAR FUSION  x2  last one 2010   4 surgeries in back per pt  . MRA/MRI w/out contrast--brain  12/2014   mild, age-related chronic microvascular ischemic change (Dr. Felecia Shelling).  Marland Kitchen RADIOACTIVE SEED IMPLANT N/A 05/17/2015   Procedure: RADIOACTIVE SEED IMPLANT/BRACHYTHERAPY IMPLANT;  Surgeon: Kathie Rhodes, MD;  Location: [place];  Service: Urology;  Laterality: N/A;  . TRANSRECTAL ULTRASOUND N/A 11/12/2014   Procedure: TRANSRECTAL ULTRASOUND AND BIOPSY  ;  Surgeon: Kathie Rhodes, MD;  Location[facility];  Service: Urology;  Laterality: N/A;  . TRANSTHORACIC ECHOCARDIOGRAM  03-29-2014   mild concentric LVH/  ef 74-25%/  grade I diastolic dysfunction/  AV poorly visualized, possibly bicupid;  mild AS,  valve area 1.78cm^2,  mean grandient 73mm Hg/  trivial TR    Outpatient Medications Prior to Visit  Medication Sig Dispense Refill  . b complex vitamins capsule Take 1 capsule by mouth daily.    . celecoxib (CELEBREX) 200 MG capsule     . CIALIS 5 MG tablet Take 1 tablet (5 mg total) by mouth daily. 10 tablet 11  . gabapentin (NEURONTIN) 600 MG tablet Take  600 mg by mouth 2 (two) times daily.     . Magnesium 500 MG TABS Take 500 mg by mouth daily.    . meclizine (ANTIVERT) 25 MG tablet TAKE 1 TABLET BY MOUTH 3 TIMES DAILY AS NEEDED FOR DIZZINESS. 90 tablet 3  . Melatonin 3 MG CAPS Take by mouth.    . Multiple Vitamin (MULTIVITAMIN) tablet Take 1 tablet by mouth daily.    Marland Kitchen rOPINIRole (REQUIP) 1 MG tablet Take 1 tablet (1 mg total) by mouth 3 (three) times daily. 270 tablet 3  . TART CHERRY PO Take by mouth daily.    . traZODone (DESYREL) 50 MG tablet TAKE 1 TO 2 TABLETS BY MOUTH AT BEDTIME AS NEEDED FOR INSOMNIA 180 tablet 3  .  VITAMIN D PO Take 50,000 Units by mouth 2 (two) times daily.    Marland Kitchen amphetamine-dextroamphetamine (ADDERALL XR) 30 MG 24 hr capsule 2 caps po qAM 60 capsule 0  . cyclobenzaprine (FLEXERIL) 10 MG tablet Take 10 mg by mouth 3 (three) times daily. (Patient not taking: Reported on 01/24/2020)    . levofloxacin (LEVAQUIN) 500 MG tablet Take 1 tablet (500 mg total) by mouth daily. (Patient not taking: Reported on 01/24/2020) 14 tablet 0  . rosuvastatin (CRESTOR) 10 MG tablet TAKE 1 TABLET BY MOUTH EVERY DAY (Patient not taking: Reported on 01/24/2020) 90 tablet 0  . tiZANidine (ZANAFLEX) 4 MG capsule  (Patient not taking: Reported on 01/24/2020)     No facility-administered medications prior to visit.    Allergies  Allergen Reactions  . Methylphenidate Palpitations    Pt reported; confirmed he wanted med added to his allergy list  . Oxycodone Itching and Other (See Comments)    Nightmares, hallucinations  . Penicillins Other (See Comments)    Unknown childhood reaction    ROS As per HPI  PE: Vitals with BMI 01/24/2020 07/13/2019 07/13/2019  Height 6\' 3"  - 6\' 3"   Weight 236 lbs 10 oz - 232 lbs 3 oz  BMI 78.46 - 96.29  Systolic 528 413 244  Diastolic 65 78 76  Pulse 62 - 64     Gen: Alert, well appearing.  Patient is oriented to person, place, time, and situation. AFFECT: pleasant, lucid thought and speech. CV:  RRR, 0-1/0 blowing systolic murmur heard best at RUSB/LUSB, minimally heard at apex.  No diastolic murmur.  No rub/gallop. Chest is clear, no wheezing or rales. Normal symmetric air entry throughout both lung fields. No chest wall deformities or tenderness. EXT: no clubbing or cyanosis.  no edema.  Dix-halpike: neg bilat. Neuro: no focal deficit.  LABS:  Lab Results  Component Value Date   TSH 2.00 02/27/2016   Lab Results  Component Value Date   WBC 4.4 07/13/2019   HGB 14.6 07/13/2019   HCT 46.1 07/13/2019   MCV 94.5 07/13/2019   PLT 105 (L) 07/13/2019   Lab Results  Component Value Date   CREATININE 0.90 07/13/2019   BUN 21 07/13/2019   NA 143 07/13/2019   K 5.2 07/13/2019   CL 106 07/13/2019   CO2 28 07/13/2019   Lab Results  Component Value Date   ALT 20 07/13/2019   AST 22 07/13/2019   ALKPHOS 70 09/26/2018   BILITOT 1.0 07/13/2019   Lab Results  Component Value Date   CHOL 143 07/13/2019   Lab Results  Component Value Date   HDL 59 07/13/2019   Lab Results  Component Value Date   LDLCALC 66 07/13/2019   Lab Results  Component Value Date   TRIG 93 07/13/2019   Lab Results  Component Value Date   CHOLHDL 2.4 07/13/2019   Lab Results  Component Value Date   PSA 0.03 (L) 12/19/2018   PSA 0.08 (L) 07/08/2017   Lab Results  Component Value Date   HGBA1C 5.0 02/27/2016   IMPRESSION AND PLAN:  1) Disequilibrium syndrome: chronic, worsening of late. Mixed vertigo sx's with nonspecific lightheaded/dizzy feeling, not clearly fitting with any primary vertigo disorder very well. Extensive w/u x at least 2 in the past unrevealing. Pt wonders about intracranial HTN, which says a friend had, but I assured him he has no symptoms of this disorder. Will continue meclizine and will look into whether there is a vertigo/dizziness specialty clinic  with any of the tertiary care centers in the region. With heart murmur and hx of mild AS (2016 echo) will repeat  echo to make sure no progression that would be causing sx's.  2) Daytime somnolence: may need OSA eval in near future. Not discussed much today.  3) Adult ADD: The current medical regimen is effective;  continue present plan and medications. I did electronic rx's for adderall xr 30mg , 2 qam, #60 today for each of the next 3 mo.   Appropriate fill on/after date was noted on each rx.  4) Systolic heart murmur, hx of mild AS and ? Bicuspid aortic valve on 2016 echo. Recheck echo--ordered.  5) RLS: stable.  Continue requip.  6) HLD: tolerating statin. LDL great and hepatic panel normal 6 mo ago. Plan repeat 6 mo.  7) Insomnia: pretty well controlled on trazodone 50-100 mg qhs. No changes today.  8) Chronic mild thrombocytopenia. Has been stable long-term, most recently 6 mo ago. Recheck CBC 6 mo.  Flu shot today  An After Visit Summary was printed and given to the patient.  FOLLOW UP: Return in about 6 months (around 07/23/2020) for annual CPE (fasting). 6 mo cpe  Signed:  Crissie Sickles, MD           01/24/2020

## 2020-01-25 ENCOUNTER — Telehealth: Payer: Self-pay | Admitting: Family Medicine

## 2020-01-25 DIAGNOSIS — R42 Dizziness and giddiness: Secondary | ICD-10-CM

## 2020-01-25 DIAGNOSIS — E878 Other disorders of electrolyte and fluid balance, not elsewhere classified: Secondary | ICD-10-CM

## 2020-01-25 NOTE — Telephone Encounter (Signed)
I would like to get pt in to a neurologist at a vertigo specialty clinic. I suspect either [facility], [facility], or [facility] has one as part of their neurology dept. Pls call and see what you can fine--start with [facility]. -thx

## 2020-01-25 NOTE — Telephone Encounter (Signed)
Referral can be placed with [facility].

## 2020-01-25 NOTE — Telephone Encounter (Signed)
OK, I'll enter order. Pls call pt to let him know this plan-thx

## 2020-01-25 NOTE — Telephone Encounter (Signed)
[facility] has availability starting in January. They will react out to him to schedule with Dr. Manuella Ghazi neuro[facility]. [phone]

## 2020-01-26 NOTE — Telephone Encounter (Signed)
Pt advised of neuro plan

## 2020-03-06 ENCOUNTER — Ambulatory Visit (HOSPITAL_COMMUNITY): Payer: 59

## 2020-03-12 ENCOUNTER — Telehealth: Payer: Self-pay | Admitting: Family Medicine

## 2020-03-12 NOTE — Telephone Encounter (Signed)
Patient requesting refill of Adderall x 3 months. States he is currently out of medication.  [facility]

## 2020-03-12 NOTE — Telephone Encounter (Signed)
Requesting: adderrall x54month Contract: 01/24/20 UDS:12/19/2018 Last Visit:01/24/20 Next Visit:n/a Last Refill:01/24/20 (60,0)  Please Advise, medication pending

## 2020-03-12 NOTE — Telephone Encounter (Signed)
I did rx's when I last saw him so he should have rx's on hold for Dec and Jan at his pharmacy.

## 2020-03-13 NOTE — Telephone Encounter (Signed)
Pharmacy is going to fill Rx

## 2020-03-13 NOTE — Telephone Encounter (Signed)
Pt states he spoke with someone at our office this morning who informed him they would [facility] and [facility] Rx on [facility].

## 2020-03-16 HISTORY — PX: OTHER SURGICAL HISTORY: SHX169

## 2020-04-04 ENCOUNTER — Other Ambulatory Visit (HOSPITAL_COMMUNITY): Payer: 59

## 2020-04-19 ENCOUNTER — Encounter: Payer: Self-pay | Admitting: Family Medicine

## 2020-04-24 ENCOUNTER — Other Ambulatory Visit (HOSPITAL_COMMUNITY): Payer: 59

## 2020-04-24 DIAGNOSIS — M47816 Spondylosis without myelopathy or radiculopathy, lumbar region: Secondary | ICD-10-CM | POA: Insufficient documentation

## 2020-04-24 DIAGNOSIS — M5136 Other intervertebral disc degeneration, lumbar region: Secondary | ICD-10-CM | POA: Insufficient documentation

## 2020-05-02 IMAGING — CR DG CHEST 2V
2 series · 2 of 2 positions shown · non-contrast
Comparison: Two-view chest x-ray 01/13/2018

CLINICAL DATA: Chills and fever.  Dizziness.  Question sepsis.

EXAM:
CHEST - 2 VIEW

[w chest pa]
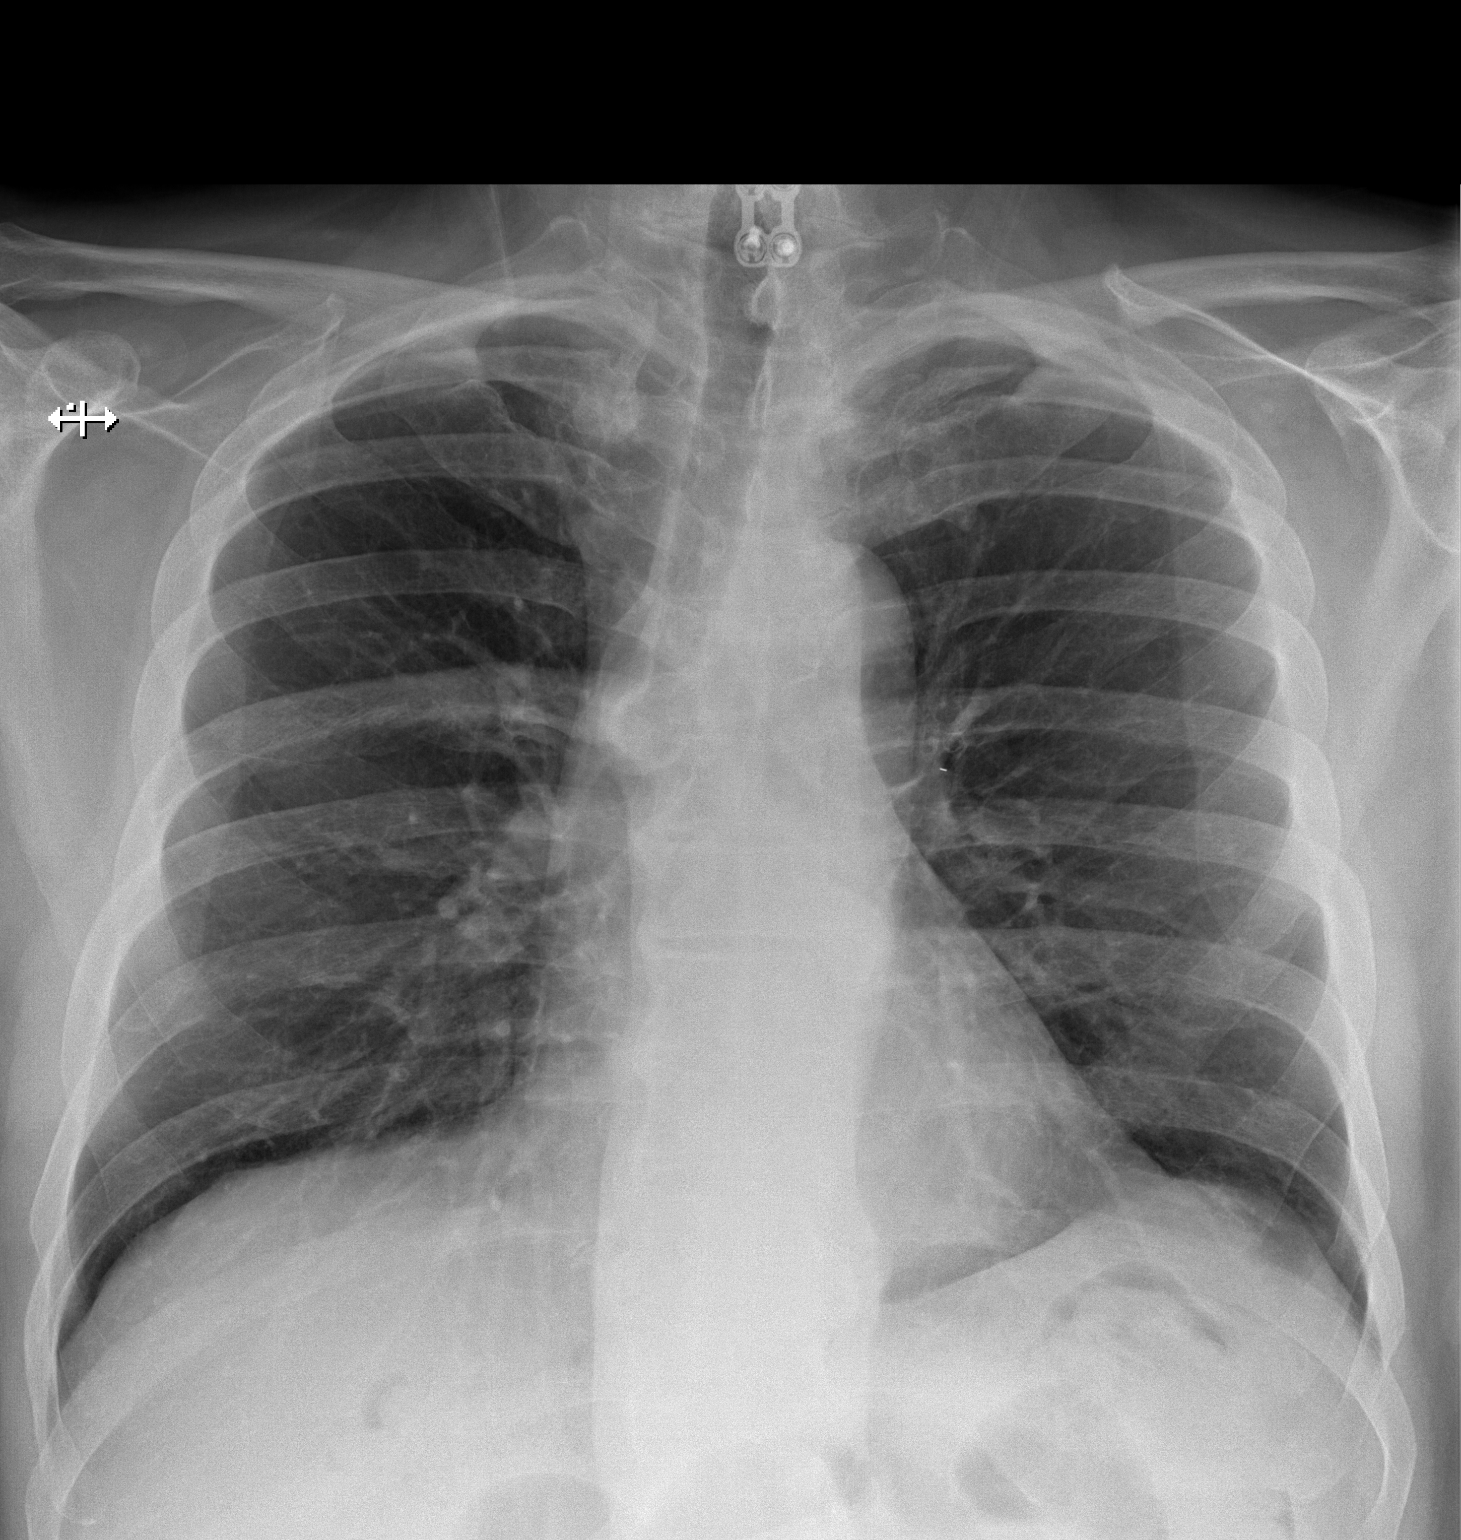

[w chest lat]
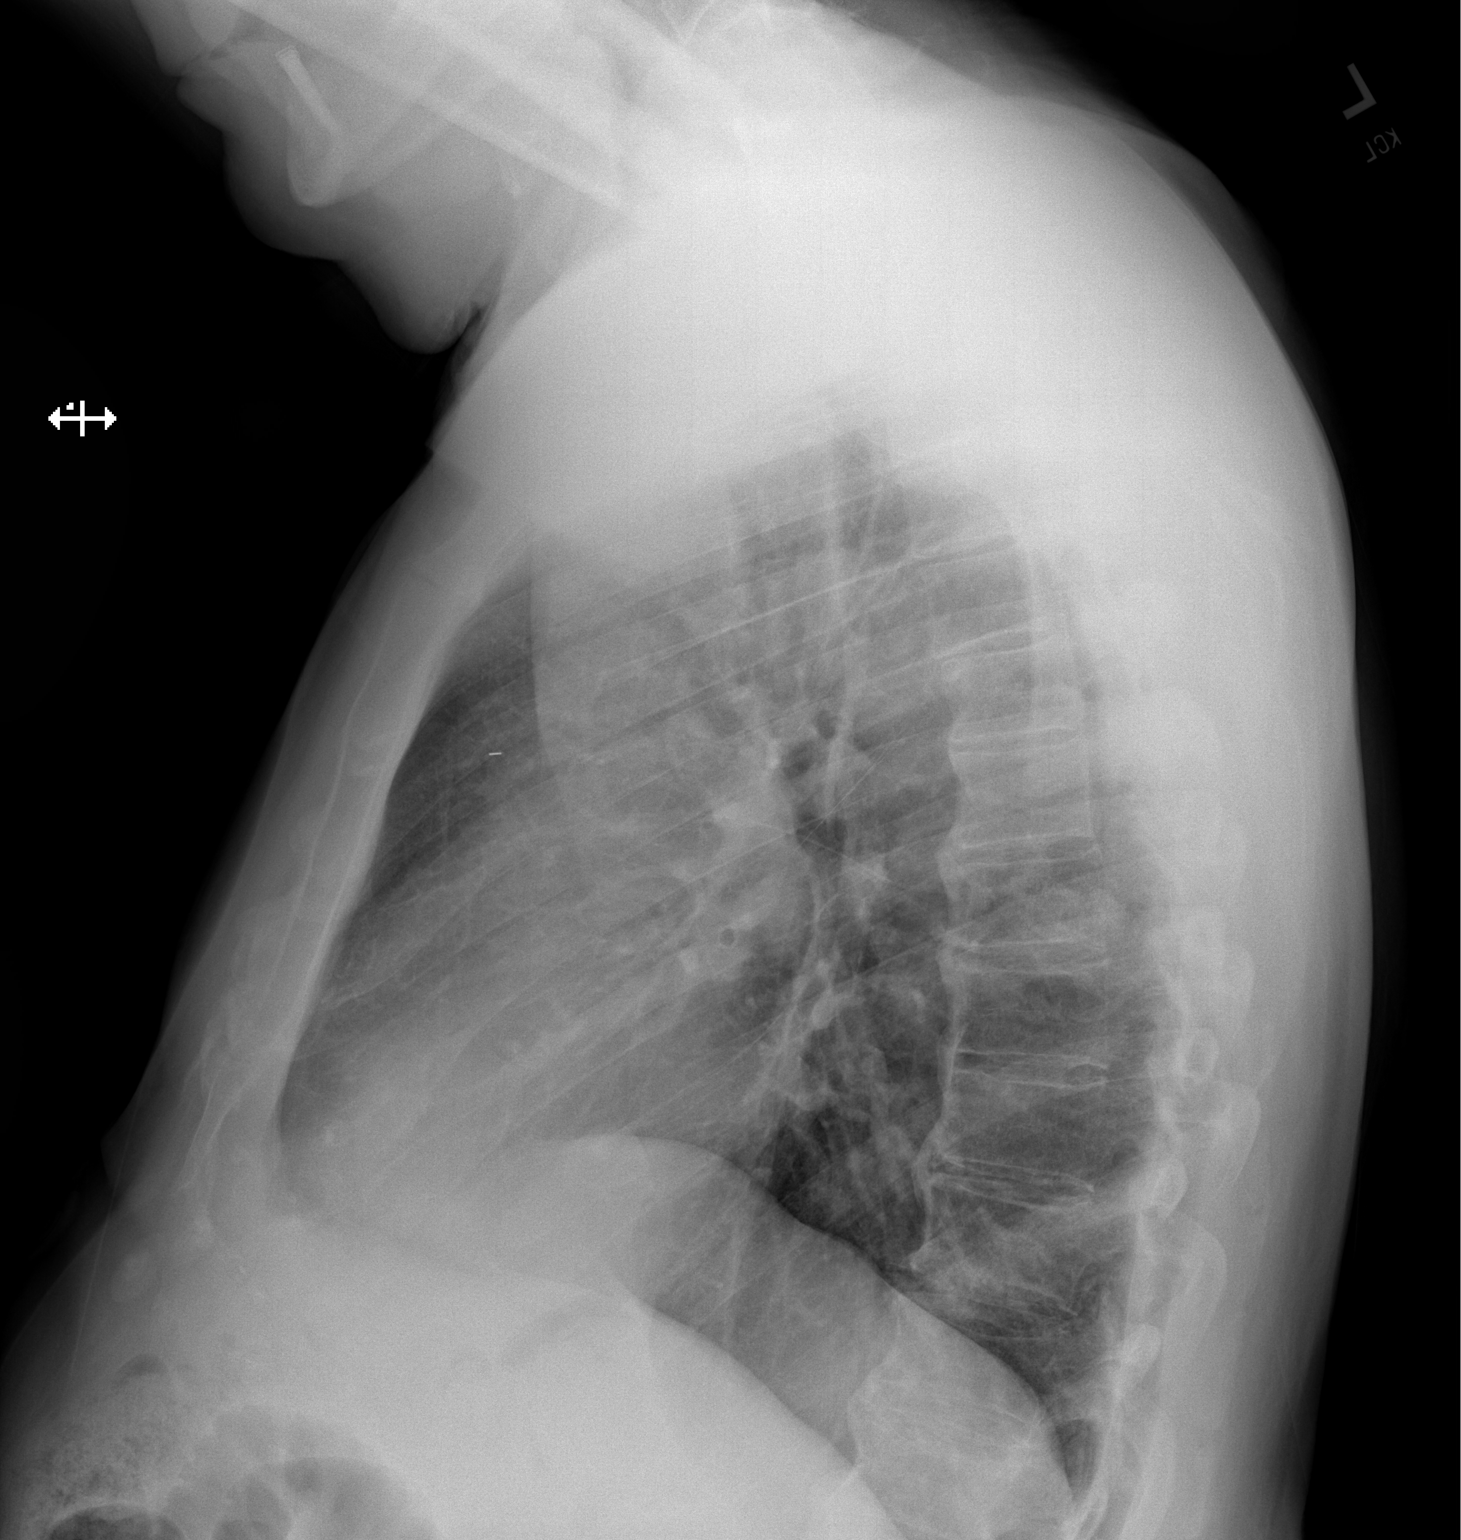

[2 of 2 positions shown; findings below may reference images not displayed]

FINDINGS: The heart size and mediastinal contours are within normal limits.
Both lungs are clear. The visualized skeletal structures are
unremarkable. Cervical spine surgery is noted.
IMPRESSION: No active cardiopulmonary disease.

## 2020-05-23 ENCOUNTER — Other Ambulatory Visit: Payer: Self-pay | Admitting: Family Medicine

## 2020-05-29 DIAGNOSIS — G894 Chronic pain syndrome: Secondary | ICD-10-CM | POA: Insufficient documentation

## 2020-06-07 ENCOUNTER — Other Ambulatory Visit: Payer: Self-pay | Admitting: Family Medicine

## 2020-06-07 MED ORDER — AMPHETAMINE-DEXTROAMPHET ER 30 MG PO CP24: ORAL_CAPSULE | ORAL | 0 refills | Status: DC

## 2020-06-07 NOTE — Telephone Encounter (Signed)
Spoke with pt regarding rx. He is aware rx should be on file for this month and April as well.

## 2020-06-07 NOTE — Telephone Encounter (Signed)
OK. Rx for this month and one for April eRx'd today.

## 2020-06-07 NOTE — Telephone Encounter (Signed)
Requesting: adderall Contract: 01/24/20 UDS: 12/19/18 Last Visit: 01/24/20 Next Visit: advised to f/u 6 mo. CPE Last Refill: 01/24/20 (60,0)  Please Advise. Medication pending

## 2020-06-07 NOTE — Telephone Encounter (Cosign Needed)
Patient requesting refill of Adderall, [facility]

## 2020-06-11 ENCOUNTER — Other Ambulatory Visit (HOSPITAL_BASED_OUTPATIENT_CLINIC_OR_DEPARTMENT_OTHER): Payer: Self-pay | Admitting: Neurosurgery

## 2020-06-11 DIAGNOSIS — G894 Chronic pain syndrome: Secondary | ICD-10-CM

## 2020-06-14 HISTORY — PX: OTHER SURGICAL HISTORY: SHX169

## 2020-06-15 ENCOUNTER — Ambulatory Visit (HOSPITAL_BASED_OUTPATIENT_CLINIC_OR_DEPARTMENT_OTHER)
Admission: RE | Admit: 2020-06-15 | Discharge: 2020-06-15 | Disposition: A | Discharge status: Home / Self Care | Payer: 59 | Source: Ambulatory Visit | Attending: Neurosurgery | Admitting: Neurosurgery

## 2020-06-15 ENCOUNTER — Other Ambulatory Visit: Payer: Self-pay

## 2020-06-15 DIAGNOSIS — G894 Chronic pain syndrome: Secondary | ICD-10-CM | POA: Diagnosis present

## 2020-06-20 ENCOUNTER — Encounter: Payer: Self-pay | Admitting: Family Medicine

## 2020-07-13 ENCOUNTER — Other Ambulatory Visit: Payer: Self-pay | Admitting: Family Medicine

## 2020-07-15 ENCOUNTER — Other Ambulatory Visit: Payer: Self-pay | Admitting: Family Medicine

## 2020-08-16 ENCOUNTER — Encounter: Payer: Self-pay | Admitting: Family Medicine

## 2020-08-23 ENCOUNTER — Other Ambulatory Visit: Payer: Self-pay | Admitting: Family Medicine

## 2020-08-26 ENCOUNTER — Telehealth: Payer: Self-pay

## 2020-08-26 NOTE — Telephone Encounter (Signed)
Rx sent 08/23/20, 30 day supply. Pt was made aware

## 2020-08-26 NOTE — Telephone Encounter (Signed)
Patient refill request.  Scheduled CPE 09/11/20  rOPINIRole (REQUIP) 1 MG tablet [[id number]]    [facility]

## 2020-09-02 ENCOUNTER — Telehealth: Payer: Self-pay

## 2020-09-02 MED ORDER — AMPHETAMINE-DEXTROAMPHET ER 30 MG PO CP24: ORAL_CAPSULE | ORAL | 0 refills | Status: DC

## 2020-09-02 NOTE — Telephone Encounter (Signed)
Patient refill request.    amphetamine-dextroamphetamine (ADDERALL XR) 30 MG 24 hr capsule [[id number]]    [facility] [place]

## 2020-09-02 NOTE — Telephone Encounter (Signed)
OK adderall erx'd

## 2020-09-02 NOTE — Telephone Encounter (Signed)
Pt scheduled for 09/11/20  Requesting: adderall 30mg  Contract: 01/24/20 UDS:n/a Last Visit:01/24/20 Next Visit:09/11/20 Last Refill: 06/07/20 (60,0)  Please Advise

## 2020-09-03 ENCOUNTER — Telehealth: Payer: Self-pay | Admitting: Family Medicine

## 2020-09-03 ENCOUNTER — Emergency Department (HOSPITAL_BASED_OUTPATIENT_CLINIC_OR_DEPARTMENT_OTHER)
Admission: EM | Admit: 2020-09-03 | Discharge: 2020-09-03 | Disposition: A | Discharge status: Home / Self Care | Payer: 59 | Attending: Emergency Medicine | Admitting: Emergency Medicine

## 2020-09-03 ENCOUNTER — Encounter (HOSPITAL_BASED_OUTPATIENT_CLINIC_OR_DEPARTMENT_OTHER): Payer: Self-pay

## 2020-09-03 ENCOUNTER — Other Ambulatory Visit: Payer: Self-pay

## 2020-09-03 DIAGNOSIS — R42 Dizziness and giddiness: Secondary | ICD-10-CM | POA: Insufficient documentation

## 2020-09-03 DIAGNOSIS — Z5321 Procedure and treatment not carried out due to patient leaving prior to being seen by health care provider: Secondary | ICD-10-CM | POA: Insufficient documentation

## 2020-09-03 LAB — CBC
HCT: 45.1 % (ref 39.0–52.0)
Hemoglobin: 14.7 g/dL (ref 13.0–17.0)
MCH: 30 pg (ref 26.0–34.0)
MCHC: 32.6 g/dL (ref 30.0–36.0)
MCV: 92 fL (ref 80.0–100.0)
Platelets: 102 10*3/uL — ABNORMAL LOW (ref 150–400)
RBC: 4.9 MIL/uL (ref 4.22–5.81)
RDW: 13.4 % (ref 11.5–15.5)
WBC: 4.6 10*3/uL (ref 4.0–10.5)
nRBC: 0 % (ref 0.0–0.2)

## 2020-09-03 LAB — BASIC METABOLIC PANEL
Anion gap: 6 (ref 5–15)
BUN: 19 mg/dL (ref 8–23)
CO2: 23 mmol/L (ref 22–32)
Calcium: 8.8 mg/dL — ABNORMAL LOW (ref 8.9–10.3)
Chloride: 108 mmol/L (ref 98–111)
Creatinine, Ser: 0.8 mg/dL (ref 0.61–1.24)
GFR, Estimated: >60 mL/min (ref >60)
Glucose, Bld: 101 mg/dL — ABNORMAL HIGH (ref 70–99)
Potassium: 4.4 mmol/L (ref 3.5–5.1)
Sodium: 137 mmol/L (ref 135–145)

## 2020-09-03 LAB — CBG MONITORING, ED: Glucose-Capillary: 107 mg/dL — ABNORMAL HIGH (ref 70–99)

## 2020-09-03 NOTE — Telephone Encounter (Signed)
Pt called back and will go to [facility] ER.

## 2020-09-03 NOTE — ED Triage Notes (Signed)
Pt c/o dizziness x 2 weeks-increase in sleep x 1 week-reports hx of vertigo/takes meclizine-to triage in w/c-NAD

## 2020-09-03 NOTE — Telephone Encounter (Signed)
Pt agreed to [facility] to [facility] [place] [facility] - Client TELEPHONE ADVICE RECORD [facility] Patient Name: Andrew Stevenson Gender: Male DOB: 02/06/1946 Age: 75 Y 26 M 24 D Return Phone Number: [phone] (Primary) Address: City/ State/ Zip: [place]  [address] Client [facility] - Client Client Site [place] - Day Physician Crissie Sickles - MD Contact Type Call Who Is Calling Patient / Member / Family / Caregiver Call Type Triage / Clinical Relationship To Patient Self Return Phone Number [phone] (Primary) Chief Complaint Dizziness Reason for Call Symptomatic / Request for [place] is needing to speak to a nurse. He was diagnosed with vertigo a year ago. Now he is unable to walk well due to being so dizzy and he cannot stay awake. Translation No Nurse Assessment Nurse: Velta Addison, RN, [place] Date/Time (Eastern Time): 09/03/2020 11:45:39 AM Confirm and document reason for call. If symptomatic, describe symptoms. ---Caller is needing to speak to a nurse. He was diagnosed with vertigo a year ago. Now he is unable to walk well due to being so dizzy and he cannot stay awake. Last year was diagnosed with positional vertigo. Has been going well up until about 2 weeks ago. Started feeling more dizzy. The past few days he feels drunk, and if he sits down he goes to sleep. He slept most of the day Sat, Sun, Mon. States he is staggering when he walks now. 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 [place] against some of his tools last week and scared he is going to get hurt. No medication changes. 8-9 weeks ago he had a spinal stimulator placed and is wondering if it is connected to the placement of this. States he is pain free for the first time and hopes it is not connected but is the only change he has had recently. Does the patient  have any new or worsening symptoms? ---Yes Will a triage be completed? ---Yes Related visit to physician within the last 2 weeks? ---No Does the PT have any chronic conditions? (i.e. diabetes, asthma, this includes High risk factors for pregnancy, etc.) ---Yes Is this a behavioral health or substance abuse call? ---No PLEASE NOTE: All timestamps contained within this report are represented as [place] Standard Time. CONFIDENTIALTY NOTICE: This fax transmission is intended only for the addressee. It contains information that is legally privileged, confidential or otherwise protected from use or disclosure. If you are not the intended recipient, you are strictly prohibited from reviewing, disclosing, copying using or disseminating any of this information or taking any action in reliance on or regarding this information. If you have received this fax in error, please notify us immediately by telephone so that we can arrange for its return to [place]. Phone: [phone], Toll-Free: [phone], Fax: [phone] Page: 2 of 2 Call Id: [id number] Guidelines Guideline Title Affirmed Question Affirmed Notes Nurse Date/Time Eilene Ghazi Time) Dizziness - Vertigo Taking a medicine that could cause dizziness (e.g., phenytoin [Dilantin], carbamazepine [Tegretol], primidone [Mysoline]) Parrott, RN, [place] 09/03/2020 11:51:28 AM Disp. Time Eilene Ghazi Time) Disposition Final User 09/03/2020 11:24:26 AM Attempt made - message left Parrott, RN, [place] 09/03/2020 11:56:33 AM See HCP within 4 Hours (or PCP triage) Yes Velta Addison, RN, [place] Disagree/Comply Comply Caller Understands Yes PreDisposition Call Doctor Care Advice Given Per Guideline SEE HCP (OR PCP TRIAGE) WITHIN 4 HOURS: * IF  OFFICE WILL BE OPEN: You need to be seen within the next 3 or 4 hours. Call your doctor (or NP/PA) now or as soon as the office opens. CARE ADVICE given per Dizziness - Vertigo (Adult) guideline. * You become worse CALL  BACK IF: Comments User: Hamilton Capri, RN Date/Time (Eastern Time): 09/03/2020 11:52:37 AM States both feet have been going numb over the past month. States it is primarily the left one User: Hamilton Capri, RN Date/Time (Eastern Time): 09/03/2020 12:04:31 PM Was on hold for appt, second caller. Called backline but no answer and after ringing it disconnected. Caller has been instructed to call back to get appt for today. If he is unable, has been instructed to go to [facility] within the next 3-4 hours to be seen. Verbalized understanding and is going to call to set up appt. Referrals REFERRED TO PCP OFFICE

## 2020-09-03 NOTE — Telephone Encounter (Signed)
See [facility] Triage notes from today 12:04p - pt was advised to call PCP office for appt w/in 4 hours or go to UC.   Pt called in to schedule appt with PCP. Advised pt that Dr. Anitra Lauth is gone for today and no available appts with Dr. Raoul Pitch. Pt states this is an emergency and he is fine coming to wait at the office to see Dr. Anitra Lauth. Again advised that Dr. Anitra Lauth is gone for today and no open appts. Pt reluctantly agreed to go to UC but is requesting appt tomorrow with Dr. Anitra Lauth.

## 2020-09-03 NOTE — ED Notes (Signed)
EMT states pt stated he was leaving after repeat VS were done

## 2020-09-11 ENCOUNTER — Other Ambulatory Visit: Payer: Self-pay

## 2020-09-11 ENCOUNTER — Ambulatory Visit (INDEPENDENT_AMBULATORY_CARE_PROVIDER_SITE_OTHER): Payer: 59 | Admitting: Family Medicine

## 2020-09-11 ENCOUNTER — Encounter: Payer: Self-pay | Admitting: Family Medicine

## 2020-09-11 VITALS — BP 138/70 | HR 60 | Temp 98.0°F | Resp 16 | Ht 75.0 in | Wt 234.6 lb

## 2020-09-11 DIAGNOSIS — G2581 Restless legs syndrome: Secondary | ICD-10-CM

## 2020-09-11 DIAGNOSIS — F988 Other specified behavioral and emotional disorders with onset usually occurring in childhood and adolescence: Secondary | ICD-10-CM

## 2020-09-11 DIAGNOSIS — E878 Other disorders of electrolyte and fluid balance, not elsewhere classified: Secondary | ICD-10-CM | POA: Diagnosis not present

## 2020-09-11 DIAGNOSIS — D696 Thrombocytopenia, unspecified: Secondary | ICD-10-CM

## 2020-09-11 DIAGNOSIS — G4733 Obstructive sleep apnea (adult) (pediatric): Secondary | ICD-10-CM

## 2020-09-11 DIAGNOSIS — G4719 Other hypersomnia: Secondary | ICD-10-CM

## 2020-09-11 DIAGNOSIS — Z79899 Other long term (current) drug therapy: Secondary | ICD-10-CM

## 2020-09-11 DIAGNOSIS — Z0000 Encounter for general adult medical examination without abnormal findings: Secondary | ICD-10-CM

## 2020-09-11 DIAGNOSIS — I350 Nonrheumatic aortic (valve) stenosis: Secondary | ICD-10-CM | POA: Diagnosis not present

## 2020-09-11 DIAGNOSIS — Z23 Encounter for immunization: Secondary | ICD-10-CM | POA: Diagnosis not present

## 2020-09-11 DIAGNOSIS — E7800 Pure hypercholesterolemia, unspecified: Secondary | ICD-10-CM

## 2020-09-11 DIAGNOSIS — R011 Cardiac murmur, unspecified: Secondary | ICD-10-CM

## 2020-09-11 DIAGNOSIS — R0683 Snoring: Secondary | ICD-10-CM

## 2020-09-11 LAB — LIPID PANEL
Cholesterol: 114 mg/dL (ref 0–200)
HDL: 55.2 mg/dL (ref >39.00)
LDL Cholesterol: 47 mg/dL (ref 0–99)
NonHDL: 58.36
Total CHOL/HDL Ratio: 2
Triglycerides: 56 mg/dL (ref 0.0–149.0)
VLDL: 11.2 mg/dL (ref 0.0–40.0)

## 2020-09-11 LAB — HEPATIC FUNCTION PANEL
ALT: 19 U/L (ref 0–53)
AST: 25 U/L (ref 0–37)
Albumin: 4.3 g/dL (ref 3.5–5.2)
Alkaline Phosphatase: 68 U/L (ref 39–117)
Bilirubin, Direct: 0.3 mg/dL (ref 0.0–0.3)
Total Bilirubin: 1.1 mg/dL (ref 0.2–1.2)
Total Protein: 6.3 g/dL (ref 6.0–8.3)

## 2020-09-11 MED ORDER — ROPINIROLE HCL 1 MG PO TABS: ORAL_TABLET | ORAL | 3 refills | Status: DC

## 2020-09-11 NOTE — Addendum Note (Signed)
Addended by: Octaviano Glow on: 09/11/2020 11:03 AM   Modules accepted: Orders

## 2020-09-11 NOTE — Progress Notes (Signed)
Office Note 09/11/2020  CC:  Chief Complaint  Patient presents with   Annual Exam    Pt is not fasting   HPI:  Andrew Stevenson is a 75 y.o. White male who is here for annual health maintenance exam and 7 mo f/u HLD, adult ADD, hx of disequilibrium syndrome/vertigo, RLS, insomnia, and chronic thrombocytopenia. A/P as of last visit: "1) Disequilibrium syndrome: chronic, worsening of late. Mixed vertigo sx's with nonspecific lightheaded/dizzy feeling, not clearly fitting with any primary vertigo disorder very well. Extensive w/u x at least 2 in the past unrevealing. Pt wonders about intracranial HTN, which says a friend had, but I assured him he has no symptoms of this disorder. Will continue meclizine and will look into whether there is a vertigo/dizziness specialty clinic with any of the tertiary care centers in the region. With heart murmur and hx of mild AS (2016 echo) will repeat echo to make sure no progression that would be causing sx's.   2) Daytime somnolence: may need OSA eval in near future. Not discussed much today.   3) Adult ADD: The current medical regimen is effective;  continue present plan and medications. I did electronic rx's for adderall xr 30mg , 2 qam, #60 today for each of the next 3 mo.   Appropriate fill on/after date was noted on each rx.   4) Systolic heart murmur, hx of mild AS and ? Bicuspid aortic valve on 2016 echo. Recheck echo--ordered.   5) RLS: stable.  Continue requip.   6) HLD: tolerating statin. LDL great and hepatic panel normal 6 mo ago. Plan repeat 6 mo.   7) Insomnia: pretty well controlled on trazodone 50-100 mg qhs. No changes today.   8) Chronic mild thrombocytopenia. Has been stable long-term, most recently 6 mo ago. Recheck CBC 6 mo."  INTERIM HX: Describes "feeling drunk" and staggers around upon getting out of bed, lasts about 2 hours and is assoc with excessive sleepiness.  Sx's wax and wane throughout the day---sx's like  this for years (see below). Again, today says he does snore but says wife has not mentioned any apneic events in sleep. He says he does not knowingly wake up with any regularity in the night.  He went to [place] ED 09/03/20 for worsened dizziness issues but left after blood draw and vitals taken.  CBC and BMET normal except mild stable thrombocytopenia.  Last visit 7 mo ago I ordered referral to Dr. Manuella Ghazi with [facility] neurology Nov 2021-->his back pain issues got in the way. He never go the echo I ordered last November--his back problems got in the way of doing this.  RLS: states he takes 5 of the ropinirole 1mg  tabs qhs to control this adequately. Says dizziness issues did not change after increasing gradually to this dose.  Adult ADD:  Pt states all is going well with the med at current dosing: much improved focus, concentration, task completion.  Less frustration, better multitasking, less impulsivity and restlessness.  Mood is stable. No side effects from the medication.  PMP AWARE reviewed today: most recent rx for adderall xr 30mg  was filled 09/02/20, # 65, rx by me. No red flags.   Past Medical History:  Diagnosis Date   Adult ADHD    adderall caused elevated bp   Anxiety and depression    in past/ due to back injuries   At risk for sleep apnea    STOP-BANG= 5      SENT TO PCP 11-05-2014  Benign paroxysmal positional vertigo    Branch retinal artery occlusion of right eye    Depression    Disequilibrium syndrome    Carotid dopplers and MRI brain NORMAL x 2 (including eighth nerve studies on most recent MRI 08/2016).   Erectile dysfunction    Failed back surgical syndrome    Dr. Sherley Bounds 04/16/20->ref to pain mgmt-> SPINAL CORD STIMULATOR helpful (2022)   Heart murmur    History of concussion    june 2015--  hit head w/ syncope---  no residual   History of memory loss    per prior PCP records./ per pt due to medication   History of syncope    june 2015 --syncope w/  fall and pt states has happened 5 more times , the last one being jan 2016,,  states had battery of test and unknown cause   Hx of adenomatous colonic polyps 05/25/2017   Recall 05/2022   Hyperlipidemia 02/2016   atorva 02/2016---lipid #s worse so changed to generic crestor.  Much improved on crestor.   IFG (impaired fasting glucose) 02/2016   HbA1c 5 %.   Insomnia    Mild aortic stenosis    per echo valve area 1.78cm^2   Myalgia and myositis, unspecified    OAB (overactive bladder)    Osteoarthritis, multiple sites    C-spine, L spine, knees, "all my joints basically"   Prostate cancer [facility]) urologist-  dr ottelin/  oncologist-  dr Valere Dross   2016 Stage T1c,  Gleason 4+3,  PSA 5.26, vol 49.19cc.  No biochemical recurrence as of 11/2019.   RLS (restless legs syndrome)    Spinal stenosis, lumbar region with neurogenic claudication    s/p L3-S1 fusion revision 09/2018. Pain worse 05/2019->MRI showed ankylosis + surg fusion, L2-3 being the only mobile level of lower spine. SPINAL CORD STIMULATOR->helpful.   Thrombocytopenia (HCC)    >100K.  Mild, chronic   Upper airway cough syndrome 2018   Dr. Halford Chessman.  Cough therapy maximized at f/u 07/2016; referred to ENT by Pulm.  Allergy testing OK. Improved as of 08/05/16 pulm f/u.   Vertebrobasilar insufficiency    per neurologist note dr Felecia Shelling-- this is possible causing syncope-- no tx just be careful getting up from sitting position   Wears glasses    White coat syndrome without hypertension     Past Surgical History:  Procedure Laterality Date   Carotid Dopplers  08/01/2014   NORMAL (Dr. Felecia Shelling)   [place]  last one 2010   2 times   COLONOSCOPY  02/2004; 05/2017   2019: adenomatous polyp, int hem, diverticulosis, radiation proctitis.  Recall 05/2022.   EYE SURGERY Bilateral 2013   5 right eye, 3 left eye surgeries (including cataract extraction's iol w/ lens implant, other surgery's for post-op lens fungal infection and floaters)   GOLD SEED  IMPLANT N/A 02/22/2015   Procedure: GOLD SEED IMPLANT;  Surgeon: Kathie Rhodes, MD;  Location: [facility];  Service: Urology;  Laterality: N/A;   LEFT THIGH QUADRICEP MUSCLE BIOPSY'S  12/31/2005   LUMBAR FUSION  x2  last one 2010   4 surgeries in back per pt   MRA/MRI w/out contrast--brain  12/2014   mild, age-related chronic microvascular ischemic change (Dr. Felecia Shelling).   RADIOACTIVE SEED IMPLANT N/A 05/17/2015   Procedure: RADIOACTIVE SEED IMPLANT/BRACHYTHERAPY IMPLANT;  Surgeon: Kathie Rhodes, MD;  Location: [place] [facility];  Service: Urology;  Laterality: N/A;   SPINAL CORD STIMULATOR IMPLANT     Spinal  cord stimulator implantation  06/2020   HELPS!   TRANSRECTAL ULTRASOUND N/A 11/12/2014   Procedure: TRANSRECTAL ULTRASOUND AND BIOPSY  ;  Surgeon: Kathie Rhodes, MD;  Location: [facility];  Service: Urology;  Laterality: N/A;   TRANSTHORACIC ECHOCARDIOGRAM  03/29/2014   mild concentric LVH/  ef 38-18%/  grade I diastolic dysfunction/  AV poorly visualized, possibly bicupid;  mild AS,  valve area 1.78cm^2,  mean grandient 59mm Hg/  trivial TR    Family History  Problem Relation Age of Onset   Hypertension Mother    Alcohol abuse Father    Arthritis Father    Lung cancer Father        smoker   Diabetes Brother    Alcohol abuse Maternal Grandfather    Stroke Maternal Grandfather    Cancer Paternal Grandmother    Alcohol abuse Paternal Grandfather    Lung cancer Brother        smoker    Social History   Socioeconomic History   Marital status: Married    Spouse name: Not on file   Number of children: Not on file   Years of education: Not on file   Highest education level: Not on file  Occupational History   Occupation: UNEMPLOYED  Tobacco Use   Smoking status: Some Days    Pack years: 0.00    Types: Cigarettes, Cigars   Smokeless tobacco: Former    Types: Snuff    Quit date: 04/12/2018   Tobacco comments:    using snuff x 25 years   Vaping Use   Vaping Use: Never used  Substance and Sexual Activity   Alcohol use: Yes    Alcohol/week: 0.0 standard drinks    Comment: rare   Drug use: No   Sexual activity: Not on file  Other Topics Concern   Not on file  Social History Narrative   Married, 1 daughter.   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   No tob.   Occ alcohol.   Social Determinants of Health   Financial Resource Strain: Not on file  Food Insecurity: Not on file  Transportation Needs: Not on file  Physical Activity: Not on file  Stress: Not on file  Social Connections: Not on file  Intimate Partner Violence: Not on file    Outpatient Medications Prior to Visit  Medication Sig Dispense Refill   amphetamine-dextroamphetamine (ADDERALL XR) 30 MG 24 hr capsule 2 caps po qAM 60 capsule 0   b complex vitamins capsule Take 1 capsule by mouth daily.     celecoxib (CELEBREX) 200 MG capsule      CIALIS 5 MG tablet Take 1 tablet (5 mg total) by mouth daily. 10 tablet 11   gabapentin (NEURONTIN) 600 MG tablet Take 600 mg by mouth 2 (two) times daily.      Magnesium 500 MG TABS Take 500 mg by mouth daily.     meclizine (ANTIVERT) 25 MG tablet TAKE 1 TABLET BY MOUTH 3 TIMES DAILY AS NEEDED FOR DIZZINESS. 90 tablet 3   Melatonin 3 MG CAPS Take by mouth.     Multiple Vitamin (MULTIVITAMIN) tablet Take 1 tablet by mouth daily.     rosuvastatin (CRESTOR) 10 MG tablet Take 1 tablet (10 mg total) by mouth daily. 90 tablet 3   TART CHERRY PO Take by mouth daily.     traZODone (DESYREL) 50 MG tablet TAKE 1 TO 2 TABLETS BY MOUTH AT BEDTIME AS  NEEDED FOR INSOMNIA 60 tablet 0   VITAMIN D PO Take 50,000 Units by mouth 2 (two) times daily.     rOPINIRole (REQUIP) 1 MG tablet TAKE 1 TABLET BY MOUTH 3 TIMES DAILY. 90 tablet 0   No facility-administered medications prior to visit.    Allergies  Allergen Reactions   Methylphenidate Palpitations    Pt reported; confirmed he wanted  med added to his allergy list   Oxycodone Itching and Other (See Comments)    Nightmares, hallucinations   Penicillins Other (See Comments)    Unknown childhood reaction    ROS Review of Systems  Constitutional:  Negative for appetite change, chills, fatigue and fever.  HENT:  Negative for congestion, dental problem, ear pain and sore throat.   Eyes:  Negative for discharge, redness and visual disturbance.  Respiratory:  Negative for cough, chest tightness, shortness of breath and wheezing.   Cardiovascular:  Negative for chest pain, palpitations and leg swelling.  Gastrointestinal:  Negative for abdominal pain, blood in stool, diarrhea, nausea and vomiting.  Genitourinary:  Negative for difficulty urinating, dysuria, flank pain, frequency, hematuria and urgency.  Musculoskeletal:  Negative for arthralgias, back pain, joint swelling, myalgias and neck stiffness.  Skin:  Negative for pallor and rash.  Neurological:  Positive for dizziness and light-headedness. Negative for speech difficulty, weakness and headaches.       Feet chronically feel numb  Hematological:  Negative for adenopathy. Does not bruise/bleed easily.  Psychiatric/Behavioral:  Negative for confusion and sleep disturbance. The patient is not nervous/anxious.    PE; Vitals with BMI 09/11/2020 09/03/2020 09/03/2020  Height 6\' 3"  - 6\' 4"   Weight 234 lbs 10 oz - 235 lbs  BMI 09.98 - 33.82  Systolic 505 397 -  Diastolic 70 83 -  Pulse 60 72 -   Gen: Alert, well appearing.  Patient is oriented to person, place, time, and situation. AFFECT: pleasant, lucid thought and speech. ENT:   Eyes: no injection, icteris, swelling, or exudate.  EOMI, PERRLA. Nose: no drainage or turbinate edema/swelling.  No injection or focal lesion.  Mouth: lips without lesion/swelling.  Oral mucosa pink and moist.  Dentition intact and without obvious caries or gingival swelling.  Oropharynx without erythema, exudate, or swelling.  Neck:  supple/nontender.  No LAD, mass, or TM.  Carotid pulses 2+ bilaterally, without bruits. CV: RRR, 6-7/3 harsh systolic murmur heard best at cardiac base, no r/g.   LUNGS: CTA bilat, nonlabored resps, good aeration in all lung fields. ABD: soft, NT, ND, BS normal.  No hepatospenomegaly or mass.  No bruits. EXT: no clubbing, cyanosis, or edema.  Musculoskeletal: no joint swelling, erythema, warmth, or tenderness.  ROM of all joints intact. Skin - no sores or suspicious lesions or rashes or color changes Neuro: CN 2-12 intact bilaterally, strength 5/5 in proximal and distal upper extremities and lower extremities bilaterally.    No tremor.   No ataxia.    Pertinent labs:  Lab Results  Component Value Date   TSH 2.00 02/27/2016   Lab Results  Component Value Date   WBC 4.6 09/03/2020   HGB 14.7 09/03/2020   HCT 45.1 09/03/2020   MCV 92.0 09/03/2020   PLT 102 (L) 09/03/2020   Lab Results  Component Value Date   CREATININE 0.80 09/03/2020   BUN 19 09/03/2020   NA 137 09/03/2020   K 4.4 09/03/2020   CL 108 09/03/2020   CO2 23 09/03/2020   Lab Results  Component Value Date  ALT 20 07/13/2019   AST 22 07/13/2019   ALKPHOS 70 09/26/2018   BILITOT 1.0 07/13/2019   Lab Results  Component Value Date   CHOL 143 07/13/2019   Lab Results  Component Value Date   HDL 59 07/13/2019   Lab Results  Component Value Date   LDLCALC 66 07/13/2019   Lab Results  Component Value Date   TRIG 93 07/13/2019   Lab Results  Component Value Date   CHOLHDL 2.4 07/13/2019   Lab Results  Component Value Date   PSA 0.03 (L) 12/19/2018   PSA 0.08 (L) 07/08/2017   Lab Results  Component Value Date   HGBA1C 5.0 02/27/2016   MRA head w/out contrast 01/02/15 (Dr. Felecia Shelling, neurology): IMPRESSION:  This MR angiogram does not show any significant stenosis and no aneurysms. Both posterior cerebral arteries have a fetal origin with prominent posterior communicating arteries. 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  MRI brain w/out contrast 01/02/15: IMPRESSION:  This is an age-appropriate MRI of the brain without contrast showing mild cortical atrophy and a few scattered foci consistent with mild age-related chronic microvascular ischemic change.   There are no acute findings.  08/01/14: carotid doppler study-->NORMAL.  ASSESSMENT AND PLAN:   1) Health maintenance exam: Reviewed age and gender appropriate health maintenance issues (prudent diet, regular exercise, health risks of tobacco and excessive alcohol, use of seatbelts, fire alarms in home, use of sunscreen).  Also reviewed age and gender appropriate health screening as well as vaccine recommendations. Vaccines: Shingrix #2 today. ALL UTD. Labs: CBC and BMET normal at ED visit 09/03/20 except for mild, stable thrombocytopenia.  Will do FLP and hepatic panel today (he IS fasting). Prostate ca screening: hx of prostate ca, followed by urology. Colon ca screening:  recall 05/2022.  2) Excessive daytime sleepiness: need to ref to sleep medicine for eval for OSA. Could his disequilibrium be a symptom of this?  3) Disequilibrium syndrome, chronic, unknown etiology. R/o OSA and check echo.  4) Systolic heart murmur, + hx of mild AS and ? Bicuspid AV on echo in 2016-->ordered echocardiogram again today.  5) Adult ADD: doing well on adderall xr 30 mg qd prn. CSC UTD. UDS today.  6) RLS: it takes 5mg  of ropinirole nightly to control symptoms adequately.  7) HLD: tolerating statin. LDL great and hepatic panel normal 1 yr ago. FLP today.  8) Chronic mild thrombocytopenia. Has been stable long-term, most recently 1 wk ago. Recheck CBC 6 mo.  An After Visit Summary was printed and given to the patient.  FOLLOW UP:  Return in about 6 months (around 03/13/2021) for routine chronic illness f/u.  Signed:  Crissie Sickles, MD           09/11/2020

## 2020-09-11 NOTE — Addendum Note (Signed)
Addended by: Deveron Furlong D on: 09/11/2020 11:04 AM   Modules accepted: Orders

## 2020-09-12 NOTE — Telephone Encounter (Signed)
Please advise 

## 2020-09-12 NOTE — Telephone Encounter (Signed)
Correct. No issues or concerns.

## 2020-09-13 DIAGNOSIS — U071 COVID-19: Secondary | ICD-10-CM

## 2020-09-13 HISTORY — DX: COVID-19: U07.1

## 2020-09-19 ENCOUNTER — Other Ambulatory Visit: Payer: Self-pay | Admitting: Family Medicine

## 2020-09-19 ENCOUNTER — Telehealth: Payer: Self-pay

## 2020-09-19 NOTE — Telephone Encounter (Signed)
Patient and his wife are out of state, [place], and patient has tested positive for COVID.  He requested to have Dr. Anitra Lauth call in "covid meds" for him.  Patient is aware provider is out of office until next week, and also [place] providers cannot call in meds outside of [place].  He would like for someone to give him a call regarding OTC meds he can take for covid symptoms.  Sinus congestion, unfortunately, I was unable to get in other sx from patient, as he was talking over me, and upset that he cant be treated with "covid meds".    Please call [phone]

## 2020-09-19 NOTE — Telephone Encounter (Signed)
Pt states that he and his wife are on an [place] that has no doctor, hospital, urgent care, store, or hotel. He is concerned about his wife because she tested positive but he tested negative twice. He stated the only way they can get off the [place] is by boat and the ship that they are on is wanting them off because of them being sick. They have limited resources and he does not know what to do. I advised pt to use the resources that are available to him and treat the symptoms as if it were a cold or the flu. Hydrate and rest as much as possible.

## 2020-09-20 LAB — DRUG MONITORING, PANEL 8 WITH CONFIRMATION, URINE
6 Acetylmorphine: NEGATIVE ng/mL (ref <10)
Alcohol Metabolites: NEGATIVE ng/mL (ref <500)
Amphetamine: 11648 ng/mL — ABNORMAL HIGH (ref <250)
Amphetamines: POSITIVE ng/mL — AB (ref <500)
Benzodiazepines: NEGATIVE ng/mL (ref <100)
Buprenorphine, Urine: NEGATIVE ng/mL (ref <5)
Buprenorphine: NEGATIVE ng/mL (ref <2)
Cocaine Metabolite: NEGATIVE ng/mL (ref <150)
Creatinine: 145.5 mg/dL (ref >=20.0)
MDMA: NEGATIVE ng/mL (ref <500)
Marijuana Metabolite: NEGATIVE ng/mL (ref <20)
Methamphetamine: NEGATIVE ng/mL (ref <250)
Naloxone: NEGATIVE ng/mL (ref <2)
Norbuprenorphine: NEGATIVE ng/mL (ref <2)
Opiates: NEGATIVE ng/mL (ref <100)
Oxidant: NEGATIVE ug/mL (ref <200)
Oxycodone: NEGATIVE ng/mL (ref <100)
pH: 5.7 (ref 4.5–9.0)

## 2020-09-20 LAB — LIPID PANEL

## 2020-09-20 LAB — DM TEMPLATE

## 2020-09-20 NOTE — Telephone Encounter (Signed)
Spoke with pt, he apologized for earlier behavior. Him and his wife both mainly have flu like symptoms, scratchy sore throat and congestion. He has had 2-3 negative covid tests but she is positive. Advised to still treat symptoms with recommendations given earlier and they can call if symptoms worsen and would like to schedule VV

## 2020-09-20 NOTE — Telephone Encounter (Signed)
I agree with your advice.  Nothing else that we could possibly do to help.  Surely the boat has something arranged for quarantining pt's that test positive ???

## 2020-09-23 ENCOUNTER — Encounter: Payer: Self-pay | Admitting: Family Medicine

## 2020-09-23 ENCOUNTER — Other Ambulatory Visit: Payer: Self-pay

## 2020-09-23 MED ORDER — NIRMATRELVIR/RITONAVIR (PAXLOVID)TABLET: 3.0000 | ORAL_TABLET | Freq: Two times a day (BID) | ORAL | 0 refills | Status: AC

## 2020-09-23 NOTE — Telephone Encounter (Addendum)
Rx sent for 3 tab BID

## 2020-09-23 NOTE — Telephone Encounter (Signed)
Can you assist with sending rx?  McGowen, Adrian Blackwater, MD to Octaviano Glow, CMA  2:20 PM  Can you send in a script for Paxlovid for him? In the pharmacy comment section of the prescription please write GFR >75. Also tell him to stop his Crestorwhile he is taking paxlovid.

## 2020-09-23 NOTE — Addendum Note (Signed)
Addended by: Octaviano Glow on: 09/23/2020 04:38 PM   Modules accepted: Orders

## 2020-09-23 NOTE — Telephone Encounter (Signed)
Pt called stating he and wife are back home in [facility] and he has tested positive for COVID on 09/22/20. Pt is very hoarse and has cough and congestion. Advised pt to treat symptoms as previously discussed, until further noticed. Please advise of any suggestions or appt availability (if needed)

## 2020-10-02 ENCOUNTER — Other Ambulatory Visit: Payer: Self-pay | Admitting: Family Medicine

## 2020-10-03 ENCOUNTER — Ambulatory Visit (HOSPITAL_BASED_OUTPATIENT_CLINIC_OR_DEPARTMENT_OTHER): Payer: 59

## 2020-10-04 ENCOUNTER — Telehealth: Payer: Self-pay

## 2020-10-04 MED ORDER — AMPHETAMINE-DEXTROAMPHET ER 30 MG PO CP24: ORAL_CAPSULE | ORAL | 0 refills | Status: DC

## 2020-10-04 NOTE — Telephone Encounter (Signed)
Adderall eRx'd for each of the next 2 mo.

## 2020-10-04 NOTE — Telephone Encounter (Signed)
  Requesting: adderall '30mg'$  Contract: 01/24/20 UDS:09/11/20 Last Visit:09/11/20 Next Visit: n/a Last Refill: 09/02/20 (60,0)   Please Advise

## 2020-10-04 NOTE — Telephone Encounter (Signed)
Patient refill request.  Patient aware Dr. Anitra Lauth out of office today.  Patient has enough meds until Monday, 10/07/20 okay to wait until Dr. Anitra Lauth returns to office.  amphetamine-dextroamphetamine (ADDERALL XR) 30 MG 24 hr capsule [id number]    [id number] [place], [place]

## 2020-10-31 ENCOUNTER — Other Ambulatory Visit: Payer: Self-pay | Admitting: Family Medicine

## 2020-11-14 ENCOUNTER — Encounter: Payer: Self-pay | Admitting: Family Medicine

## 2020-11-20 ENCOUNTER — Other Ambulatory Visit: Payer: Self-pay | Admitting: Family Medicine

## 2020-11-20 LAB — PSA: PSA: 0.019

## 2020-11-20 NOTE — Telephone Encounter (Signed)
Patient requesting refill of Adderall. Please send to same [facility] in [place].

## 2020-11-21 MED ORDER — AMPHETAMINE-DEXTROAMPHET ER 30 MG PO CP24: ORAL_CAPSULE | ORAL | 0 refills | Status: DC

## 2020-11-21 NOTE — Telephone Encounter (Signed)
Requesting: adderall Contract: 01/24/20 UDS: 09/11/20 Last Visit: 09/11/20 Next Visit: advised to f/u 6 mo. Last Refill:10/04/20(60,0)  Please review and advise, med pending

## 2020-11-21 NOTE — Telephone Encounter (Signed)
Adderall rx's for each of the next 3 months done

## 2020-12-21 ENCOUNTER — Encounter (HOSPITAL_BASED_OUTPATIENT_CLINIC_OR_DEPARTMENT_OTHER): Payer: Self-pay | Admitting: Emergency Medicine

## 2020-12-21 ENCOUNTER — Other Ambulatory Visit: Payer: Self-pay

## 2020-12-21 ENCOUNTER — Emergency Department (HOSPITAL_BASED_OUTPATIENT_CLINIC_OR_DEPARTMENT_OTHER): Payer: Medicare Other

## 2020-12-21 ENCOUNTER — Emergency Department (HOSPITAL_BASED_OUTPATIENT_CLINIC_OR_DEPARTMENT_OTHER)
Admission: EM | Admit: 2020-12-21 | Discharge: 2020-12-21 | Disposition: A | Discharge status: Home / Self Care | Payer: Medicare Other | Attending: Student | Admitting: Student

## 2020-12-21 DIAGNOSIS — W312XXA Contact with powered woodworking and forming machines, initial encounter: Secondary | ICD-10-CM | POA: Diagnosis not present

## 2020-12-21 DIAGNOSIS — S6992XA Unspecified injury of left wrist, hand and finger(s), initial encounter: Secondary | ICD-10-CM | POA: Diagnosis present

## 2020-12-21 DIAGNOSIS — Z8616 Personal history of COVID-19: Secondary | ICD-10-CM | POA: Insufficient documentation

## 2020-12-21 DIAGNOSIS — F17210 Nicotine dependence, cigarettes, uncomplicated: Secondary | ICD-10-CM | POA: Insufficient documentation

## 2020-12-21 DIAGNOSIS — Z23 Encounter for immunization: Secondary | ICD-10-CM | POA: Insufficient documentation

## 2020-12-21 DIAGNOSIS — Z79899 Other long term (current) drug therapy: Secondary | ICD-10-CM | POA: Diagnosis not present

## 2020-12-21 DIAGNOSIS — I10 Essential (primary) hypertension: Secondary | ICD-10-CM | POA: Diagnosis not present

## 2020-12-21 DIAGNOSIS — S61321A Laceration with foreign body of left index finger with damage to nail, initial encounter: Secondary | ICD-10-CM | POA: Diagnosis not present

## 2020-12-21 DIAGNOSIS — S61311S Laceration without foreign body of left index finger with damage to nail, sequela: Secondary | ICD-10-CM

## 2020-12-21 DIAGNOSIS — Z8546 Personal history of malignant neoplasm of prostate: Secondary | ICD-10-CM | POA: Diagnosis not present

## 2020-12-21 DIAGNOSIS — S61311A Laceration without foreign body of left index finger with damage to nail, initial encounter: Secondary | ICD-10-CM

## 2020-12-21 MED ORDER — LIDOCAINE HCL 2 % IJ SOLN
20.0000 mL | Freq: Once | INTRAMUSCULAR | Status: AC
  Administered 2020-12-21: 400 mg
  Filled 2020-12-21: qty 20

## 2020-12-21 MED ORDER — TETANUS-DIPHTH-ACELL PERTUSSIS 5-2.5-18.5 LF-MCG/0.5 IM SUSY
0.5000 mL | PREFILLED_SYRINGE | Freq: Once | INTRAMUSCULAR | Status: AC
  Administered 2020-12-21: 0.5 mL via INTRAMUSCULAR
  Filled 2020-12-21: qty 0.5

## 2020-12-21 MED ORDER — OXYCODONE-ACETAMINOPHEN 5-325 MG PO TABS: 1.0000 | ORAL_TABLET | Freq: Four times a day (QID) | ORAL | 0 refills | Status: DC | PRN

## 2020-12-21 MED ORDER — CEPHALEXIN 500 MG PO CAPS: 500.0000 mg | ORAL_CAPSULE | Freq: Two times a day (BID) | ORAL | 0 refills | Status: DC

## 2020-12-21 NOTE — ED Triage Notes (Signed)
Left  index finger injury with table saw ,  has good neuro done last night at 8 pm [facility] last tetanus

## 2020-12-21 NOTE — Discharge Instructions (Addendum)
You were seen and evaluated in the emergency department for further evaluation of a finger laceration.  As we discussed, we have removed the nail and closed the nailbed and surrounding laceration.  We will place you on Keflex which is an antibiotic.  I have also prescribed Percocet in the event for breakthrough pain.  Otherwise use Tylenol/ibuprofen scheduled.  Please follow-up with Dr. Greta Doom with orthopedic surgery on Tuesday or return to the emergency department sooner if you are experiencing worsening pain, uncontrolled bleeding, or any other concerns you might have.

## 2020-12-21 NOTE — ED Notes (Signed)
ED provider at bedside for repair

## 2020-12-21 NOTE — ED Notes (Signed)
Dermabond at bedside.  

## 2020-12-21 NOTE — ED Provider Notes (Signed)
[facility] Provider Note   CSN: [id number] Arrival date & time: 12/21/20  0932     History Chief Complaint  Patient presents with   Finger Injury    [facility] is a 75 y.o. male who presents to the emergency department with a left second finger laceration that occurred last night around 9 PM.  He states that he was cutting wood when he accidentally cut his finger with a tablesaw.  Laceration is localized to the distal tip of the second left finger that extends into the nail.  He rates his laceration pain mild in severity and is worse with palpation and movement.  He denies any other injury.  His tetanus is not up-to-date.  The history is provided by the patient. No language interpreter was used.      Past Medical History:  Diagnosis Date   Adult ADHD    adderall caused elevated bp   Anxiety and depression    in past/ due to back injuries   At risk for sleep apnea    STOP-BANG= 5      SENT TO PCP 11-05-2014   Benign paroxysmal positional vertigo    Branch retinal artery occlusion of right eye    COVID-19 virus infection 09/2020   Depression    Disequilibrium syndrome    Carotid dopplers and MRI brain NORMAL x 2 (including eighth nerve studies on most recent MRI 08/2016).   Erectile dysfunction    Failed back surgical syndrome    Dr. Sherley Bounds 04/16/20->ref to pain mgmt-> SPINAL CORD STIMULATOR helpful (2022)   Gross hematuria    undergoing CT as of 11/04/20 urol visit.   Heart murmur    History of concussion    june 2015--  hit head w/ syncope---  no residual   History of memory loss    per prior PCP records./ per pt due to medication   History of syncope    june 2015 --syncope w/ fall and pt states has happened 5 more times , the last one being jan 2016,,  states had battery of test and unknown cause   Hx of adenomatous colonic polyps 05/25/2017   Recall 05/2022   Hyperlipidemia 02/2016   atorva 02/2016---lipid #s worse so changed to  generic crestor.  Much improved on crestor.   IFG (impaired fasting glucose) 02/2016   HbA1c 5 %.   Insomnia    Mild aortic stenosis    per echo valve area 1.78cm^2   Myalgia and myositis, unspecified    OAB (overactive bladder)    Osteoarthritis, multiple sites    C-spine, L spine, knees, "all my joints basically"   Prostate cancer [facility]) urologist-  dr ottelin/  oncologist-  dr Valere Dross   2016 Stage T1c,  Gleason 4+3,  PSA 5.26, vol 49.19cc.  No biochemical recurrence as of 10/2020.   RLS (restless legs syndrome)    Spinal stenosis, lumbar region with neurogenic claudication    s/p L3-S1 fusion revision 09/2018. Pain worse 05/2019->MRI showed ankylosis + surg fusion, L2-3 being the only mobile level of lower spine. SPINAL CORD STIMULATOR->helpful.   Thrombocytopenia (HCC)    >100K.  Mild, chronic   Upper airway cough syndrome 2018   Dr. Halford Chessman.  Cough therapy maximized at f/u 07/2016; referred to ENT by Pulm.  Allergy testing OK. Improved as of 08/05/16 pulm f/u.   Vertebrobasilar insufficiency    per neurologist note dr Felecia Shelling-- this is possible causing syncope-- no tx just be careful  getting up from sitting position   Wears glasses    White coat syndrome without hypertension     Patient Active Problem List   Diagnosis Date Noted   Chronic pain syndrome 05/29/2020   Degeneration of lumbar intervertebral disc 04/24/2020   Lumbar spondylosis 04/24/2020   Fusion of lumbosacral spine 10/03/2018   Hx of adenomatous colonic polyps 06/02/2017   Chronic cough 07/22/2016   Hoarseness 07/22/2016   Elevated PSA 04/17/2015   Erectile dysfunction 04/17/2015   Facet arthropathy, cervical 04/17/2015   Osteoarthritis 04/17/2015   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 Felecia Shelling)   [place]  last one 2010   2 times   COLONOSCOPY  02/2004; 05/2017   2019: adenomatous polyp, int hem, diverticulosis, radiation proctitis.  Recall 05/2022.   EYE SURGERY Bilateral 2013   5 right eye, 3 left eye surgeries (including cataract extraction's iol w/ lens implant, other surgery's for post-op lens fungal infection and floaters)   GOLD SEED IMPLANT N/A 02/22/2015   Procedure: GOLD SEED IMPLANT;  Surgeon: Kathie Rhodes, MD;  Location: [place];  Service: Urology;  Laterality: N/A;   LEFT THIGH QUADRICEP MUSCLE BIOPSY'S  12/31/2005   LUMBAR FUSION  x2  last one 2010   4 surgeries in back per pt   MRA/MRI w/out contrast--brain  12/2014   mild, age-related chronic microvascular ischemic change (Dr. Felecia Shelling).   RADIOACTIVE SEED IMPLANT N/A 05/17/2015   Procedure: RADIOACTIVE SEED IMPLANT/BRACHYTHERAPY IMPLANT;  Surgeon: Kathie Rhodes, MD;  Location: [place];  Service: Urology;  Laterality: N/A;   SPINAL CORD STIMULATOR IMPLANT     Spinal cord stimulator implantation  06/2020   HELPS!   TRANSRECTAL ULTRASOUND N/A 11/12/2014   Procedure: TRANSRECTAL ULTRASOUND AND BIOPSY  ;  Surgeon: Kathie Rhodes, MD;  Location: [facility];  Service: Urology;  Laterality: N/A;   TRANSTHORACIC ECHOCARDIOGRAM  03/29/2014   mild concentric LVH/  ef 78-58%/  grade I diastolic dysfunction/  AV poorly visualized, possibly bicupid;  mild AS,  valve area 1.78cm^2,  mean grandient 72mm Hg/  trivial TR       Family History  Problem Relation Age of Onset   Hypertension Mother    Alcohol abuse Father    Arthritis Father    Lung cancer  Father        smoker   Diabetes Brother    Alcohol abuse Maternal Grandfather    Stroke Maternal Grandfather    Cancer Paternal Grandmother    Alcohol abuse Paternal Grandfather    Lung cancer Brother        smoker    Social History   Tobacco Use   Smoking status: Some Days    Types: Cigarettes, Cigars   Smokeless tobacco: Former    Types: Snuff    Quit date: 04/12/2018   Tobacco comments:    using snuff x 25 years  Vaping Use   Vaping Use: Never used  Substance Use Topics  Alcohol use: Yes    Alcohol/week: 0.0 standard drinks    Comment: rare   Drug use: No    Home Medications Prior to Admission medications   Medication Sig Start Date End Date Taking? Authorizing Provider  cephALEXin (KEFLEX) 500 MG capsule Take 1 capsule (500 mg total) by mouth 2 (two) times daily. 12/21/20  Yes Raul Del, Drevin Ortner M, PA-C  oxyCODONE-acetaminophen (PERCOCET/ROXICET) 5-325 MG tablet Take 1 tablet by mouth every 6 (six) hours as needed for severe pain. 12/21/20  Yes Raul Del, Callin Ashe M, PA-C  amphetamine-dextroamphetamine (ADDERALL XR) 30 MG 24 hr capsule 2 caps po qAM 11/21/20   McGowen, Adrian Blackwater, MD  b complex vitamins capsule Take 1 capsule by mouth daily.    [provider]  celecoxib (CELEBREX) 200 MG capsule  04/11/18   [provider]  CIALIS 5 MG tablet Take 1 tablet (5 mg total) by mouth daily. 12/19/18   McGowen, Adrian Blackwater, MD  gabapentin (NEURONTIN) 600 MG tablet Take 600 mg by mouth 2 (two) times daily.  06/28/19   [provider]  Magnesium 500 MG TABS Take 500 mg by mouth daily.    [provider]  meclizine (ANTIVERT) 25 MG tablet TAKE 1 TABLET BY MOUTH 3 TIMES A DAY AS NEEDED FOR DIZZINESS 10/02/20   McGowen, Adrian Blackwater, MD  Melatonin 3 MG CAPS Take by mouth.    [provider]  Multiple Vitamin (MULTIVITAMIN) tablet Take 1 tablet by mouth daily.    [provider]  rOPINIRole (REQUIP) 1 MG tablet 5 tabs po qhs 09/11/20   McGowen, Adrian Blackwater, MD  rosuvastatin (CRESTOR) 10 MG tablet Take 1 tablet (10 mg total) by mouth daily. 01/24/20   McGowen, Adrian Blackwater, MD  TART CHERRY PO Take by mouth daily.    [provider]  traZODone (DESYREL) 50 MG tablet TAKE 1 TO 2 TABLETS BY MOUTH EVERY DAY AT BEDTIME AS NEEDED FOR INSOMNIA 10/31/20   McGowen, Adrian Blackwater, MD  VITAMIN D PO Take 50,000 Units by mouth 2 (two) times daily.    [provider]    Allergies    Methylphenidate, Oxycodone, and Penicillins  Review of Systems   Review of Systems  All other systems reviewed and are negative.  Physical Exam Updated Vital Signs BP (!) 168/88 (BP Location: Right Arm)   Pulse (!) 56   Temp 98.3 F (36.8 C) (Oral)   Resp 16   Ht 6\' 3"  (1.905 m)   Wt 102.1 kg   SpO2 98%   BMI 28.12 kg/m   Physical Exam Vitals reviewed.  Constitutional:      Appearance: Normal appearance.  HENT:     Head: Normocephalic and atraumatic.  Eyes:     General:        Right eye: No discharge.        Left eye: No discharge.     Conjunctiva/sclera: Conjunctivae normal.  Pulmonary:     Effort: Pulmonary effort is normal.  Skin:    General: Skin is warm and dry.     Findings: No rash.  Neurological:     General: No focal deficit present.     Mental Status: He is alert.  Psychiatric:        Mood and Affect: Mood normal.        Behavior: Behavior normal.      ED Results / Procedures / Treatments   Labs (all labs ordered are listed, but only abnormal results are displayed) Labs  Reviewed - No data to display  EKG None  Radiology DG Hand 2 View Left  Result Date: 12/21/2020 CLINICAL DATA:  75 year old male with left index finger table saw injury. EXAM: LEFT HAND - 2 VIEW COMPARISON:  None. FINDINGS: Traumatic comminution of the tuft of the 2nd distal phalanx. Punctate displaced bone fragments. Regional soft tissue injury. The 2nd D IP remains intact. Other osseous structures appear intact. No tracking soft tissue gas. There is a  punctate retained metallic foreign body adjacent to the tuft of the thumb distal phalanx. There is IP joint osteoarthritis, primarily 3rd through 5th PIP. IMPRESSION: 1. Traumatic comminution of the tuft of the 2nd distal phalanx. 2. No other acute radiographic finding. Osteoarthritis. Punctate retained metallic foreign body adjacent to the tuft of the thumb distal phalanx. Electronically Signed   By: Genevie Ann M.D.   On: 12/21/2020 10:29    Procedures .Marland KitchenLaceration Repair  Date/Time: 12/21/2020 2:56 PM Performed by: Hendricks Limes, PA-C Authorized by: Hendricks Limes, PA-C   Consent:    Consent obtained:  Verbal   Consent given by:  Patient   Risks discussed:  Infection and pain Universal protocol:    Procedure explained and questions answered to patient or proxy's satisfaction: yes     Relevant documents present and verified: yes     Test results available: yes     Imaging studies available: yes     Required blood products, implants, devices, and special equipment available: no     Site/side marked: no     Immediately prior to procedure, a time out was called: no     Patient identity confirmed:  Verbally with patient and arm band Anesthesia:    Anesthesia method:  Nerve block   Block location:  Left MCP   Block needle gauge:  25 G   Block anesthetic:  Lidocaine 2% w/o epi   Block injection procedure:  Anatomic landmarks identified, negative aspiration for blood and anatomic landmarks palpated   Block outcome:  Anesthesia achieved Laceration details:    Location:  Finger   Finger location:  L index finger   Length (cm):  5 Pre-procedure details:    Preparation:  Patient was prepped and draped in usual sterile fashion Exploration:    Hemostasis achieved with:  Direct pressure   Imaging obtained: x-ray     Imaging outcome: foreign body not noted     Wound exploration: wound explored through full range of motion   Treatment:    Area cleansed with:  Chlorhexidine and saline    Irrigation solution:  Sterile water   Irrigation volume:  0.5 liter   Visualized foreign bodies/material removed: yes     Debridement:  Extensive   Undermining:  Extensive Skin repair:    Repair method:  Sutures   Suture size:  6-0   Suture material:  Prolene   Number of sutures:  7 Approximation:    Approximation:  Loose Repair type:    Repair type:  Complex Post-procedure details:    Dressing:  Antibiotic ointment and bulky dressing   Procedure completion:  Tolerated well, no immediate complications Comments:     Nail was removed with scalpel and blunt dissection.  4 sutures were placed within the nail matrix. A scant amount of dermabond was placed on the volar aspect of the finger to help with approximation. Bacitracin was applied to the wound and it was wrapped with gauze.    Medications Ordered in ED Medications  Tdap (BOOSTRIX) injection  0.5 mL (0.5 mLs Intramuscular Given 12/21/20 1021)  lidocaine (XYLOCAINE) 2 % (with pres) injection 400 mg (400 mg Infiltration Given by Other 12/21/20 1411)    ED Course  I have reviewed the triage vital signs and the nursing notes.  Pertinent labs & imaging results that were available during my care of the patient were reviewed by me and considered in my medical decision making (see chart for details).    MDM Rules/Calculators/A&P                          RASAAN BROTHERTON is a 75 y.o. male who presents the emergency department for further evaluation of a finger laceration.  Given that the nailbed is disturbed with an associated tuft fracture on imaging I consulted hand surgery.  Spoke with Dr. Greta Doom who recommended removing the nail suturing together the nail matrix and having him follow-up in the office on Tuesday.  Nail was successfully removed and sutured.  Please see procedure note above for further details.  Appropriate hemostasis was obtained.  The wound was dressed after placing copious amount of bacitracin ointment.  I placed the  patient on Keflex and gave him a short course of narcotic pain medication for breakthrough pain.  Patient exhibited full understanding and will follow-up on Tuesday.  He is hemodynamically stable and safe for discharge.   Final Clinical Impression(s) / ED Diagnoses Final diagnoses:  Laceration of left index finger without foreign body with damage to nail, sequela  Laceration of left index finger without foreign body with damage to nail, initial encounter    Rx / DC Orders ED Discharge Orders          Ordered    oxyCODONE-acetaminophen (PERCOCET/ROXICET) 5-325 MG tablet  Every 6 hours PRN        12/21/20 1505    cephALEXin (KEFLEX) 500 MG capsule  2 times daily        12/21/20 [address] [place], [place] 12/21/20 1727    Teressa Lower, MD 12/22/20 [phone]

## 2020-12-21 NOTE — ED Notes (Signed)
Suture cart and lac tray placed outside room.

## 2020-12-21 NOTE — ED Notes (Signed)
Pt reports he wants to leave within the next 5 mins.  Sts he has things to do.  EDP made aware.  Provided update regarding delay.

## 2020-12-21 NOTE — ED Notes (Signed)
Pt s wound to finger dressed with bacitracin and dsd , pt given a few more dressings, pt sstates understanding of all instructions

## 2020-12-21 NOTE — ED Notes (Signed)
Physician doing procedure at bedside, unable to obtain vitals at this time. Will try once physician is finished.

## 2020-12-22 NOTE — ED Provider Notes (Signed)
..  Laceration Repair  Date/Time: 12/22/2020 7:15 AM Performed by: Teressa Lower, MD Authorized by: Teressa Lower, MD   Anesthesia:    Anesthesia method:  Nerve block   Block location:  L index finger   Block anesthetic:  Lidocaine 1% WITH epi   Block technique:  Digital block   Block outcome:  Anesthesia achieved Laceration details:    Location:  Finger   Finger location:  L index finger   Length (cm):  5 Pre-procedure details:    Preparation:  Imaging obtained to evaluate for foreign bodies Exploration:    Limited defect created (wound extended): no     Hemostasis achieved with:  Tourniquet   Imaging outcome: foreign body not noted     Wound exploration: entire depth of wound visualized     Wound extent: fascia violated and muscle damage     Contaminated: no   Treatment:    Area cleansed with:  Saline   Amount of cleaning:  Extensive   Irrigation solution:  Sterile saline   Irrigation volume:  1L   Irrigation method:  Pressure wash   Debridement:  Moderate Skin repair:    Repair method:  Sutures   Suture size:  6-0   Suture material:  Prolene   Suture technique:  Simple interrupted   Number of sutures:  7 Approximation:    Approximation:  Close Repair type:    Repair type:  Intermediate Post-procedure details:    Dressing:  Antibiotic ointment and sterile dressing   Procedure completion:  Tolerated well, no immediate complications    Teressa Lower, MD 12/22/20 [phone]

## 2021-01-01 ENCOUNTER — Encounter: Payer: Self-pay | Admitting: Family Medicine

## 2021-01-06 ENCOUNTER — Telehealth: Payer: Self-pay

## 2021-01-06 NOTE — Telephone Encounter (Signed)
Patient refill request.  [facility]  amphetamine-dextroamphetamine (ADDERALL XR) 30 MG 24 hr capsule [[id number]]

## 2021-01-06 NOTE — Telephone Encounter (Signed)
Pt made aware to follow up with the pharmacy. At the time of last refill,  2 additional sent with notes to pharmacy "fill on/after 12/20/20 and 01/20/21". Pt voiced understanding.

## 2021-01-27 ENCOUNTER — Ambulatory Visit (INDEPENDENT_AMBULATORY_CARE_PROVIDER_SITE_OTHER): Payer: 59 | Admitting: Family Medicine

## 2021-01-27 ENCOUNTER — Other Ambulatory Visit: Payer: Self-pay

## 2021-01-27 ENCOUNTER — Encounter: Payer: Self-pay | Admitting: Family Medicine

## 2021-01-27 VITALS — BP 122/76 | HR 55 | Temp 97.6°F | Ht 75.0 in | Wt 242.4 lb

## 2021-01-27 DIAGNOSIS — G4719 Other hypersomnia: Secondary | ICD-10-CM

## 2021-01-27 DIAGNOSIS — F988 Other specified behavioral and emotional disorders with onset usually occurring in childhood and adolescence: Secondary | ICD-10-CM

## 2021-01-27 DIAGNOSIS — E7800 Pure hypercholesterolemia, unspecified: Secondary | ICD-10-CM | POA: Diagnosis not present

## 2021-01-27 DIAGNOSIS — R0683 Snoring: Secondary | ICD-10-CM

## 2021-01-27 DIAGNOSIS — E878 Other disorders of electrolyte and fluid balance, not elsewhere classified: Secondary | ICD-10-CM

## 2021-01-27 DIAGNOSIS — R011 Cardiac murmur, unspecified: Secondary | ICD-10-CM

## 2021-01-27 MED ORDER — TRAZODONE HCL 50 MG PO TABS: ORAL_TABLET | ORAL | 1 refills | Status: DC

## 2021-01-27 NOTE — Progress Notes (Addendum)
OFFICE VISIT  01/27/2021  CC: No chief complaint on file.   HPI:    Patient is a 75 y.o. male who presents for f/u HLD, adult ADD, hx of disequilibrium syndrome/vertigo, RLS, insomnia, and chronic thrombocytopenia.   1) Excessive daytime sleepiness: patient is not sure if he experiences apnea while sleeping. Says that his daytime sleepiness could be due to the ropinirole. Sleep medicine reached out without success for eval for OSA.   3) Disequilibrium syndrome, chronic, unknown etiology. Feels symptoms are controlled enough to where it doesn't impact his day. Currently takes two pills of Meclizine for symptom control, but mentioned he was prescribed 3. Expressed interest in wanting to switch to three doses as needed.   4) Systolic heart murmur: hx of mild AS with Bicuspid AV on echo in 2016. Patient has yet to received repeat echo since 2016. Denies SOB, dyspnea on exertion, bounding pulses.   5) Adult ADD: doing well on adderall xr 30 mg qd prn.   6) RLS: Patient takes 4mg  of ropinirole nightly to control symptoms adequately. Reports daytime sleepiness and attributes it to ropinirole. Inquired about lowering the dosage. Otherwise, patient tolerates the medication.   7) HLD: tolerating statin.   8) Chronic mild thrombocytopenia. Patient has not reported easy bruising or bleeding.   ROS as above, plus--> no fevers, no CP, no SOB, no wheezing, no cough, no HAs, no rashes, no melena/hematochezia.  No polyuria or polydipsia.  No myalgias or arthralgias.  No focal weakness, paresthesias, or tremors.  No acute vision or hearing abnormalities.  No dysuria or unusual/new urinary urgency or frequency.  No recent changes in lower legs. No n/v/d or abd pain.  No palpitations.     PMP AWARE reviewed today: most recent rx for adderall xr was filled 01/07/21, # [phone], rx by me. No red flags.  Past Medical History:  Diagnosis Date   Adult ADHD    adderall caused elevated bp   Anxiety and  depression    in past/ due to back injuries   At risk for sleep apnea    STOP-BANG= 5      SENT TO PCP 11-05-2014   Benign paroxysmal positional vertigo    Branch retinal artery occlusion of right eye    COVID-19 virus infection 09/2020   Depression    Disequilibrium syndrome    Carotid dopplers and MRI brain NORMAL x 2 (including eighth nerve studies on most recent MRI 08/2016).   Erectile dysfunction    Failed back surgical syndrome    Dr. Sherley Bounds 04/16/20->ref to pain mgmt-> SPINAL CORD STIMULATOR helpful (2022)   Gross hematuria    passed a stone prior to the planned CT 10/2020   Heart murmur    History of concussion    june 2015--  hit head w/ syncope---  no residual   History of memory loss    per prior PCP records./ per pt due to medication   History of syncope    june 2015 --syncope w/ fall and pt states has happened 5 more times , the last one being jan 2016,,  states had battery of test and unknown cause   Hx of adenomatous colonic polyps 05/25/2017   Recall 05/2022   Hyperlipidemia 02/2016   atorva 02/2016---lipid #s worse so changed to generic crestor.  Much improved on crestor.   IFG (impaired fasting glucose) 02/2016   HbA1c 5 %.   Insomnia    Mild aortic stenosis    per echo valve  area 1.78cm^2   Myalgia and myositis, unspecified    Nephrolithiasis    passed stone 10/2020   OAB (overactive bladder)    Osteoarthritis, multiple sites    C-spine, L spine, knees, "all my joints basically"   Prostate cancer [facility]) urologist-  dr ottelin/  oncologist-  dr Valere Dross   2016 Stage T1c,  Gleason 4+3,  PSA 5.26, vol 49.19cc.  No biochemical recurrence as of 11/2020.   RLS (restless legs syndrome)    Spinal stenosis, lumbar region with neurogenic claudication    s/p L3-S1 fusion revision 09/2018. Pain worse 05/2019->MRI showed ankylosis + surg fusion, L2-3 being the only mobile level of lower spine. SPINAL CORD STIMULATOR->helpful.   Thrombocytopenia (HCC)    >100K.  Mild,  chronic   Upper airway cough syndrome 2018   Dr. Halford Chessman.  Cough therapy maximized at f/u 07/2016; referred to ENT by Pulm.  Allergy testing OK. Improved as of 08/05/16 pulm f/u.   Vertebrobasilar insufficiency    per neurologist note dr Felecia Shelling-- this is possible causing syncope-- no tx just be careful getting up from sitting position   Wears glasses    White coat syndrome without hypertension     Past Surgical History:  Procedure Laterality Date   Carotid Dopplers  08/01/2014   NORMAL (Dr. Felecia Shelling)   [place]  last one 2010   2 times   COLONOSCOPY  02/2004; 05/2017   2019: adenomatous polyp, int hem, diverticulosis, radiation proctitis.  Recall 05/2022.   EYE SURGERY Bilateral 2013   5 right eye, 3 left eye surgeries (including cataract extraction's iol w/ lens implant, other surgery's for post-op lens fungal infection and floaters)   GOLD SEED IMPLANT N/A 02/22/2015   Procedure: GOLD SEED IMPLANT;  Surgeon: Kathie Rhodes, MD;  Location: [facility];  Service: Urology;  Laterality: N/A;   LEFT THIGH QUADRICEP MUSCLE BIOPSY'S  12/31/2005   LUMBAR FUSION  x2  last one 2010   4 surgeries in back per pt   MRA/MRI w/out contrast--brain  12/2014   mild, age-related chronic microvascular ischemic change (Dr. Felecia Shelling).   RADIOACTIVE SEED IMPLANT N/A 05/17/2015   Procedure: RADIOACTIVE SEED IMPLANT/BRACHYTHERAPY IMPLANT;  Surgeon: Kathie Rhodes, MD;  Location: [place];  Service: Urology;  Laterality: N/A;   SPINAL CORD STIMULATOR IMPLANT     Spinal cord stimulator implantation  06/2020   HELPS!   TRANSRECTAL ULTRASOUND N/A 11/12/2014   Procedure: TRANSRECTAL ULTRASOUND AND BIOPSY  ;  Surgeon: Kathie Rhodes, MD;  Location: [facility];  Service: Urology;  Laterality: N/A;   TRANSTHORACIC ECHOCARDIOGRAM  03/29/2014   mild concentric LVH/  ef 03-47%/  grade I diastolic dysfunction/  AV poorly visualized, possibly bicupid;  mild AS,  valve area 1.78cm^2,   mean grandient 89mm Hg/  trivial TR    Outpatient Medications Prior to Visit  Medication Sig Dispense Refill   amphetamine-dextroamphetamine (ADDERALL XR) 30 MG 24 hr capsule 2 caps po qAM 60 capsule 0   b complex vitamins capsule Take 1 capsule by mouth daily.     celecoxib (CELEBREX) 200 MG capsule      cephALEXin (KEFLEX) 500 MG capsule Take 1 capsule (500 mg total) by mouth 2 (two) times daily. 20 capsule 0   CIALIS 5 MG tablet Take 1 tablet (5 mg total) by mouth daily. 10 tablet 11   gabapentin (NEURONTIN) 600 MG tablet Take 600 mg by mouth 2 (two) times daily.      Magnesium 500  MG TABS Take 500 mg by mouth daily.     meclizine (ANTIVERT) 25 MG tablet TAKE 1 TABLET BY MOUTH 3 TIMES A DAY AS NEEDED FOR DIZZINESS 90 tablet 0   Melatonin 3 MG CAPS Take by mouth.     Multiple Vitamin (MULTIVITAMIN) tablet Take 1 tablet by mouth daily.     oxyCODONE-acetaminophen (PERCOCET/ROXICET) 5-325 MG tablet Take 1 tablet by mouth every 6 (six) hours as needed for severe pain. 5 tablet 0   rOPINIRole (REQUIP) 1 MG tablet 5 tabs po qhs 450 tablet 3   rosuvastatin (CRESTOR) 10 MG tablet Take 1 tablet (10 mg total) by mouth daily. 90 tablet 3   TART CHERRY PO Take by mouth daily.     traZODone (DESYREL) 50 MG tablet TAKE 1 TO 2 TABLETS BY MOUTH EVERY DAY AT BEDTIME AS NEEDED FOR INSOMNIA 180 tablet 0   VITAMIN D PO Take 50,000 Units by mouth 2 (two) times daily.     No facility-administered medications prior to visit.    Allergies  Allergen Reactions   Methylphenidate Palpitations    Pt reported; confirmed he wanted med added to his allergy list   Oxycodone Itching and Other (See Comments)    Nightmares, hallucinations   Penicillins Other (See Comments)    Unknown childhood reaction    ROS As per HPI  PE: Vitals with BMI 12/21/2020 12/21/2020 12/21/2020  Height - - 6\' 3"   Weight - - 225 lbs  BMI - - 32.95  Systolic 188 416 -  Diastolic 88 83 -  Pulse 56 68 -   Physical  Exam Cardiovascular:     Rate and Rhythm: Normal rate and regular rhythm.     Heart sounds: Murmur heard.  Systolic murmur is present with a grade of 2/6.  Pulmonary:     Effort: Pulmonary effort is normal.     Breath sounds: Normal breath sounds.  Neurological:     General: No focal deficit present.     Mental Status: He is alert and oriented to person, place, and time.   2/6 systolic murmur heard best at RUSB rad to R infraclav, no diastolic murmur.    LABS:  Lab Results  Component Value Date   TSH 2.00 02/27/2016   Lab Results  Component Value Date   WBC 4.6 09/03/2020   HGB 14.7 09/03/2020   HCT 45.1 09/03/2020   MCV 92.0 09/03/2020   PLT 102 (L) 09/03/2020   Lab Results  Component Value Date   CREATININE 0.80 09/03/2020   BUN 19 09/03/2020   NA 137 09/03/2020   K 4.4 09/03/2020   CL 108 09/03/2020   CO2 23 09/03/2020   Lab Results  Component Value Date   ALT 19 09/11/2020   AST 25 09/11/2020   ALKPHOS 68 09/11/2020   BILITOT 1.1 09/11/2020   Lab Results  Component Value Date   CHOL 114 09/11/2020   Lab Results  Component Value Date   HDL 55.20 09/11/2020   Lab Results  Component Value Date   LDLCALC 47 09/11/2020   LDLCALC CANCELED 09/11/2020   Lab Results  Component Value Date   TRIG 56.0 09/11/2020   TRIG CANCELED 09/11/2020   Lab Results  Component Value Date   CHOLHDL 2 09/11/2020   Lab Results  Component Value Date   PSA 0.019 11/20/2020   PSA 0.021 11/22/2019   PSA 0.03 (L) 12/19/2018   Lab Results  Component Value Date   HGBA1C 5.0 02/27/2016  IMPRESSION AND PLAN: Raihan is a non-toxic appearing male who presents today for chronic illness f/u     1) Excessive daytime sleepiness: still ongoing. Patient is not aware of sleep apnea symptoms (apnea, restless sleep). Referred again to sleep medicine. Recommended reducing ropinirole to 2 tablets instead of 4. If symptoms are well control, patient is advised to add trazodone.   2)  Disequilibrium syndrome, chronic, unknown etiology. Still present and not progressive. Continue Meclizine for symptom control. Referring to sleep study to R/o OSA   4) Systolic heart murmur: Hx of mild AS and Bicuspid AV on echo in 2016. Ordered echocardiogram again today to monitor cardiac structure   5) Adult ADD: doing well on adderall xr 30 mg qd prn. CSC UTD. UDS today.   6) RLS: it takes 4mg  of ropinirole nightly to control symptoms adequately. Recommend tapering pedication to 2 pills of ropinirole and then increasing if needed.  Hx of trouble with sleep initiation that trazodone helped with in the past and once allowed lower dosing of ropinirole--we'll try him on this again IF backing off on ropinirole leads to worse insomnia.   7) HLD: Stable with normal cholesterol (09/11/2020) and the patient is tolerating statin. LDL great and hepatic panel normal 1 yr ago.  8) Chronic mild thrombocytopenia. Has been stable long-term, Recheck CBC 6 mo."  9) BPH with LUTS: he is about to transition from cialis 5mg  to flomax per urologist's orders.  An After Visit Summary was printed and given to the patient.  FOLLOW UP:  4 weeks  Phil Dopp - MS3  I personally was present during the history, physical exam, and medical decision-making activities of this service and have verified that the service and findings are accurately documented in the student's note.  Signed:  Crissie Sickles, MD           01/27/2021

## 2021-01-27 NOTE — Progress Notes (Signed)
I personally was present during the history, physical exam, and medical decision-making activities of this service and have verified that the service and findings are accurately documented in the student's note. Signed:  Crissie Sickles, MD           01/27/2021

## 2021-01-28 LAB — COMPREHENSIVE METABOLIC PANEL
AG Ratio: 2.2 (calc) (ref 1.0–2.5)
ALT: 21 U/L (ref 9–46)
AST: 22 U/L (ref 10–35)
Albumin: 4 g/dL (ref 3.6–5.1)
Alkaline phosphatase (APISO): 61 U/L (ref 35–144)
BUN: 16 mg/dL (ref 7–25)
CO2: 26 mmol/L (ref 20–32)
Calcium: 8.7 mg/dL (ref 8.6–10.3)
Chloride: 108 mmol/L (ref 98–110)
Creat: 0.82 mg/dL (ref 0.70–1.28)
Globulin: 1.8 g/dL (calc) — ABNORMAL LOW (ref 1.9–3.7)
Glucose, Bld: 96 mg/dL (ref 65–99)
Potassium: 4.3 mmol/L (ref 3.5–5.3)
Sodium: 141 mmol/L (ref 135–146)
Total Bilirubin: 0.8 mg/dL (ref 0.2–1.2)
Total Protein: 5.8 g/dL — ABNORMAL LOW (ref 6.1–8.1)

## 2021-02-10 ENCOUNTER — Other Ambulatory Visit: Payer: Self-pay | Admitting: Family Medicine

## 2021-02-24 ENCOUNTER — Other Ambulatory Visit: Payer: Self-pay

## 2021-02-24 ENCOUNTER — Ambulatory Visit (INDEPENDENT_AMBULATORY_CARE_PROVIDER_SITE_OTHER): Payer: PPO | Admitting: Family Medicine

## 2021-02-24 ENCOUNTER — Encounter: Payer: Self-pay | Admitting: Family Medicine

## 2021-02-24 VITALS — BP 172/82 | HR 87 | Temp 99.2°F | Ht 75.0 in | Wt 240.8 lb

## 2021-02-24 DIAGNOSIS — M25461 Effusion, right knee: Secondary | ICD-10-CM | POA: Diagnosis not present

## 2021-02-24 DIAGNOSIS — I10 Essential (primary) hypertension: Secondary | ICD-10-CM | POA: Diagnosis not present

## 2021-02-24 DIAGNOSIS — F988 Other specified behavioral and emotional disorders with onset usually occurring in childhood and adolescence: Secondary | ICD-10-CM | POA: Diagnosis not present

## 2021-02-24 DIAGNOSIS — M25561 Pain in right knee: Secondary | ICD-10-CM | POA: Diagnosis not present

## 2021-02-24 DIAGNOSIS — G2581 Restless legs syndrome: Secondary | ICD-10-CM

## 2021-02-24 DIAGNOSIS — G4719 Other hypersomnia: Secondary | ICD-10-CM

## 2021-02-24 NOTE — Progress Notes (Signed)
OFFICE VISIT  02/24/2021  CC:  Chief Complaint  Patient presents with   Follow-up    Excessive daytime sleepiness    HPI:    Patient is a 74 y.o. male who presents for 1 mo f/u excessive daytime sleepiness, RLS, and insomnia. Last visit we added trazodone, advised him to taper down his ropinirole, and referred to sleep medicine.   INTERIM HX: Doing great from a daytime sleepiness standpoint---cutting back to 3 requip only at bedtime has been the trick.   Has not needed any trazodone.  +Hx white coat HTN.  He is somewhat upset today about a stressful family situation. Echocardiogram ordered last visit-->was scheduled but he had to cancel and says he'll call and reschedule.  New concern: About 1 month ago he squatted down and went standing back up felt and heard a pop in his left knee medial aspect since then it swelled up occasionally and hurts only at night, often interfering with sleep.  During the day it does not seem to bother him.   Past Medical History:  Diagnosis Date   Adult ADHD    adderall caused elevated bp   Anxiety and depression    in past/ due to back injuries   At risk for sleep apnea    STOP-BANG= 5      SENT TO PCP 11-05-2014   Benign paroxysmal positional vertigo    Branch retinal artery occlusion of right eye    COVID-19 virus infection 09/2020   Depression    Disequilibrium syndrome    Carotid dopplers and MRI brain NORMAL x 2 (including eighth nerve studies on most recent MRI 08/2016).   Erectile dysfunction    Failed back surgical syndrome    Dr. Sherley Bounds 04/16/20->ref to pain mgmt-> SPINAL CORD STIMULATOR helpful (2022)   Gross hematuria    passed a stone prior to the planned CT 10/2020   Heart murmur    History of concussion    june 2015--  hit head w/ syncope---  no residual   History of memory loss    per prior PCP records./ per pt due to medication   History of syncope    june 2015 --syncope w/ fall and pt states has happened 5 more times  , the last one being jan 2016,,  states had battery of test and unknown cause   Hx of adenomatous colonic polyps 05/25/2017   Recall 05/2022   Hyperlipidemia 02/2016   atorva 02/2016---lipid #s worse so changed to generic crestor.  Much improved on crestor.   IFG (impaired fasting glucose) 02/2016   HbA1c 5 %.   Insomnia    Mild aortic stenosis    per echo valve area 1.78cm^2   Myalgia and myositis, unspecified    Nephrolithiasis    passed stone 10/2020   OAB (overactive bladder)    Osteoarthritis, multiple sites    C-spine, L spine, knees, "all my joints basically"   Prostate cancer [facility]) urologist-  dr ottelin/  oncologist-  dr Valere Dross   2016 Stage T1c,  Gleason 4+3,  PSA 5.26, vol 49.19cc.  No biochemical recurrence as of 11/2020.   RLS (restless legs syndrome)    Spinal stenosis, lumbar region with neurogenic claudication    s/p L3-S1 fusion revision 09/2018. Pain worse 05/2019->MRI showed ankylosis + surg fusion, L2-3 being the only mobile level of lower spine. SPINAL CORD STIMULATOR->helpful.   Thrombocytopenia (HCC)    >100K.  Mild, chronic   Upper airway cough syndrome 2018  Dr. Halford Chessman.  Cough therapy maximized at f/u 07/2016; referred to ENT by Pulm.  Allergy testing OK. Improved as of 08/05/16 pulm f/u.   Vertebrobasilar insufficiency    per neurologist note dr Felecia Shelling-- this is possible causing syncope-- no tx just be careful getting up from sitting position   Wears glasses    White coat syndrome without hypertension     Past Surgical History:  Procedure Laterality Date   Carotid Dopplers  08/01/2014   NORMAL (Dr. Felecia Shelling)   [place]  last one 2010   2 times   COLONOSCOPY  02/2004; 05/2017   2019: adenomatous polyp, int hem, diverticulosis, radiation proctitis.  Recall 05/2022.   EYE SURGERY Bilateral 2013   5 right eye, 3 left eye surgeries (including cataract extraction's iol w/ lens implant, other surgery's for post-op lens fungal infection and floaters)   GOLD SEED  IMPLANT N/A 02/22/2015   Procedure: GOLD SEED IMPLANT;  Surgeon: Kathie Rhodes, MD;  Location: [facility];  Service: Urology;  Laterality: N/A;   LEFT THIGH QUADRICEP MUSCLE BIOPSY'S  12/31/2005   LUMBAR FUSION  x2  last one 2010   4 surgeries in back per pt   MRA/MRI w/out contrast--brain  12/2014   mild, age-related chronic microvascular ischemic change (Dr. Felecia Shelling).   RADIOACTIVE SEED IMPLANT N/A 05/17/2015   Procedure: RADIOACTIVE SEED IMPLANT/BRACHYTHERAPY IMPLANT;  Surgeon: Kathie Rhodes, MD;  Location: [place];  Service: Urology;  Laterality: N/A;   SPINAL CORD STIMULATOR IMPLANT     Spinal cord stimulator implantation  06/2020   HELPS!   TRANSRECTAL ULTRASOUND N/A 11/12/2014   Procedure: TRANSRECTAL ULTRASOUND AND BIOPSY  ;  Surgeon: Kathie Rhodes, MD;  Location[facility];  Service: Urology;  Laterality: N/A;   TRANSTHORACIC ECHOCARDIOGRAM  03/29/2014   mild concentric LVH/  ef 72-53%/  grade I diastolic dysfunction/  AV poorly visualized, possibly bicupid;  mild AS,  valve area 1.78cm^2,  mean grandient 88mm Hg/  trivial TR    Outpatient Medications Prior to Visit  Medication Sig Dispense Refill   amphetamine-dextroamphetamine (ADDERALL XR) 30 MG 24 hr capsule 2 caps po qAM 60 capsule 0   b complex vitamins capsule Take 1 capsule by mouth daily.     celecoxib (CELEBREX) 200 MG capsule      Magnesium 500 MG TABS Take 500 mg by mouth daily.     meclizine (ANTIVERT) 25 MG tablet TAKE 1 TABLET BY MOUTH THREE TIMES A DAY AS NEEDED FOR DIZZINESS 30 tablet 0   Melatonin 3 MG CAPS Take by mouth.     Multiple Vitamin (MULTIVITAMIN) tablet Take 1 tablet by mouth daily.     rOPINIRole (REQUIP) 1 MG tablet 5 tabs po qhs 450 tablet 3   rosuvastatin (CRESTOR) 10 MG tablet TAKE 1 TABLET BY MOUTH EVERY DAY 90 tablet 3   tadalafil (CIALIS) 5 MG tablet Take 5 mg by mouth daily.     TART CHERRY PO Take by mouth daily.     VITAMIN D PO Take 50,000  Units by mouth 2 (two) times daily.     tamsulosin (FLOMAX) 0.4 MG CAPS capsule Take 0.4 mg by mouth daily. (Patient not taking: Reported on 01/27/2021)     traZODone (DESYREL) 50 MG tablet TAKE 1 TO 2 TABLETS BY MOUTH EVERY DAY AT BEDTIME AS NEEDED FOR INSOMNIA (Patient not taking: Reported on 02/24/2021) 30 tablet 1   gabapentin (NEURONTIN) 600 MG tablet Take 600 mg by mouth 2 (  two) times daily.  (Patient not taking: Reported on 02/24/2021)     No facility-administered medications prior to visit.    Allergies  Allergen Reactions   Methylphenidate Palpitations    Pt reported; confirmed he wanted med added to his allergy list   Oxycodone Itching and Other (See Comments)    Nightmares, hallucinations   Penicillins Other (See Comments)    Unknown childhood reaction    ROS As per HPI  PE: Vitals with BMI 02/24/2021 01/27/2021 12/21/2020  Height 6\' 3"  6\' 3"  -  Weight 240 lbs 13 oz 242 lbs 6 oz -  BMI 37.3 42.8 -  Systolic 768 115 726  Diastolic 82 76 88  Pulse 87 55 56     Right knee shows trace effusion.  No erythema or warmth.  No instability.  He has no tenderness to palpation.  No pain with varus and valgus stress.  He has full active and passive range of motion without significant pain  LABS:    Chemistry      Component Value Date/Time   NA 141 01/27/2021 1359   NA 141 09/26/2018 0000   K 4.3 01/27/2021 1359   CL 108 01/27/2021 1359   CO2 26 01/27/2021 1359   BUN 16 01/27/2021 1359   BUN 20 09/26/2018 0000   CREATININE 0.82 01/27/2021 1359   GLU 95 09/26/2018 0000      Component Value Date/Time   CALCIUM 8.7 01/27/2021 1359   ALKPHOS 68 09/11/2020 1101   AST 22 01/27/2021 1359   ALT 21 01/27/2021 1359   BILITOT 0.8 01/27/2021 1359     Lab Results  Component Value Date   WBC 4.6 09/03/2020   HGB 14.7 09/03/2020   HCT 45.1 09/03/2020   MCV 92.0 09/03/2020   PLT 102 (L) 09/03/2020   Lab Results  Component Value Date   CHOL 114 09/11/2020   HDL 55.20  09/11/2020   LDLCALC 47 09/11/2020   LDLCALC CANCELED 09/11/2020   LDLDIRECT 98.0 08/20/2016   TRIG 56.0 09/11/2020   TRIG CANCELED 09/11/2020   CHOLHDL 2 09/11/2020   Lab Results  Component Value Date   TSH 2.00 02/27/2016   Lab Results  Component Value Date   HGBA1C 5.0 02/27/2016    IMPRESSION AND PLAN:  1 excessive daytime sleepiness: Resolved with cutting back Requip.  His restless legs syndrome is well treated with the Requip 3 of the 1 mg tabs at bedtime.  2. Adult ADD.  All stable. Continue TWO of the adderall xr 30mg  tabs qAM.  3) White coat HTN: reassured.  Check home bp periodically and call if persistently >140/90.  4) R knee pain and effusion: suspect he may have had slight meniscus injury vs osteoarthritis.  Procedure: Therapeutic knee injection.  The patient's clinical condition is marked by substantial pain and/or significant functional disability.  Other conservative therapy has not provided relief, is contraindicated, or not appropriate.  There is a reasonable likelihood that injection will significantly improve the patient's pain and/or functional disability. Cleaned skin with alcohol swab, used lateral approach to enter knee joint, Injected 2 ml of 1% plain lidocaine for local anesthesia, then injected 46ml of 40/ml kenalog +2  ml of 1% plain lidocaine into joint space without resistance.  No immediate complications.  Patient tolerated procedure well.  Post-injection care discussed, including 20 min of icing 1-2 times in the next 4-8 hours, frequent non weight-bearing ROM exercises over the next few days, and general pain medication management.  R  knee plain films ordered today.  5) Mild AS-->surveillance echo planned, pt to call and reschedule.   No labs needed today.  An After Visit Summary was printed and given to the patient.  FOLLOW UP: Return in about 6 months (around 08/25/2021) for annual CPE (fasting).  Signed:  Crissie Sickles, MD            02/24/2021

## 2021-02-27 ENCOUNTER — Other Ambulatory Visit: Payer: Self-pay

## 2021-02-27 MED ORDER — CELECOXIB 200 MG PO CAPS: 200.0000 mg | ORAL_CAPSULE | Freq: Every day | ORAL | 3 refills | Status: DC

## 2021-02-27 NOTE — Telephone Encounter (Signed)
RF request for Celebrex LOV: 02/24/21 Next ov: advised to f/u 6 months Last written: historical med 04/11/18  Please fill, if appropriate. Med pending

## 2021-02-27 NOTE — Telephone Encounter (Signed)
Patient refill request.    [place]  celecoxib (CELEBREX) 200 MG capsule [[id number]]

## 2021-03-03 ENCOUNTER — Telehealth: Payer: Self-pay

## 2021-03-03 NOTE — Telephone Encounter (Signed)
Pt advised adderall is causing dry mouth. He would like to know if there is an alternative medication that he can try. Advised provider is out of the office but would be informed.  Please review and advise

## 2021-03-03 NOTE — Telephone Encounter (Signed)
Pt advised of refill

## 2021-03-14 ENCOUNTER — Ambulatory Visit (HOSPITAL_BASED_OUTPATIENT_CLINIC_OR_DEPARTMENT_OTHER): Admission: RE | Admit: 2021-03-14 | Payer: 59 | Source: Ambulatory Visit

## 2021-03-19 ENCOUNTER — Other Ambulatory Visit: Payer: Self-pay

## 2021-03-19 ENCOUNTER — Other Ambulatory Visit (HOSPITAL_BASED_OUTPATIENT_CLINIC_OR_DEPARTMENT_OTHER): Payer: 59

## 2021-03-19 ENCOUNTER — Ambulatory Visit (HOSPITAL_COMMUNITY): Payer: 59

## 2021-03-21 ENCOUNTER — Other Ambulatory Visit: Payer: Self-pay

## 2021-03-21 ENCOUNTER — Encounter: Payer: Self-pay | Admitting: Family Medicine

## 2021-03-21 ENCOUNTER — Ambulatory Visit (HOSPITAL_BASED_OUTPATIENT_CLINIC_OR_DEPARTMENT_OTHER)
Admission: RE | Admit: 2021-03-21 | Discharge: 2021-03-21 | Disposition: A | Discharge status: Home / Self Care | Payer: Medicare Other | Source: Ambulatory Visit | Attending: Family Medicine | Admitting: Family Medicine

## 2021-03-21 DIAGNOSIS — R011 Cardiac murmur, unspecified: Secondary | ICD-10-CM

## 2021-03-21 LAB — ECHOCARDIOGRAM COMPLETE
AR max vel: 1.06 cm2
AV Area VTI: 1.14 cm2
AV Area mean vel: 1.09 cm2
AV Mean grad: 21.6 mmHg
AV Peak grad: 35.6 mmHg
Ao pk vel: 2.98 m/s
Area-P 1/2: 2.18 cm2
Calc EF: 61.5 %
S' Lateral: 4.3 cm
Single Plane A2C EF: 56 %
Single Plane A4C EF: 65.1 %

## 2021-03-25 ENCOUNTER — Other Ambulatory Visit: Payer: Self-pay

## 2021-03-25 ENCOUNTER — Other Ambulatory Visit: Payer: Self-pay | Admitting: Family Medicine

## 2021-03-25 ENCOUNTER — Telehealth (INDEPENDENT_AMBULATORY_CARE_PROVIDER_SITE_OTHER): Payer: PPO | Admitting: Family Medicine

## 2021-03-25 ENCOUNTER — Encounter: Payer: Self-pay | Admitting: Family Medicine

## 2021-03-25 VITALS — BP 140/70 | Temp 98.2°F | Wt 235.0 lb

## 2021-03-25 DIAGNOSIS — J069 Acute upper respiratory infection, unspecified: Secondary | ICD-10-CM

## 2021-03-25 DIAGNOSIS — E878 Other disorders of electrolyte and fluid balance, not elsewhere classified: Secondary | ICD-10-CM

## 2021-03-25 DIAGNOSIS — J111 Influenza due to unidentified influenza virus with other respiratory manifestations: Secondary | ICD-10-CM | POA: Diagnosis not present

## 2021-03-25 MED ORDER — DOXYCYCLINE HYCLATE 100 MG PO CAPS: 100.0000 mg | ORAL_CAPSULE | Freq: Two times a day (BID) | ORAL | 0 refills | Status: DC

## 2021-03-25 MED ORDER — PROMETHAZINE-DM 6.25-15 MG/5ML PO SYRP: ORAL_SOLUTION | ORAL | 1 refills | Status: DC

## 2021-03-25 MED ORDER — AMPHETAMINE-DEXTROAMPHET ER 30 MG PO CP24: ORAL_CAPSULE | ORAL | 0 refills | Status: DC

## 2021-03-25 NOTE — Progress Notes (Signed)
Virtual Visit via Video Note  I connected with Andrew Stevenson  on 03/25/21 at  9:30 AM EST by a video enabled telemedicine application and verified that I am speaking with the correct person using two identifiers.  Location patient: [place] Location provider:work or home office Persons participating in the virtual visit: patient, provider  I discussed the limitations and requested verbal permission for telemedicine visit. The patient expressed understanding and agreed to proceed.  CC:  Chief Complaint  Patient presents with   Cough    Constant, productive but unsure color of phlegm and lying down makes it worse. Pt also c/o head congestion, being sick on stomach and extreme dizziness. Completed at home covid test 2 days ago; negative results. Symptoms started 9 days ago. Has not used any otc meds.   HPI:    Patient is a 76 y.o. male who presents for cough.  HPI: Onset about 9 days ago: Nasal congestion/stuffy head and sinuses, cough, body aches, severe fatigue.  Symptoms have not improved any.  Denies shortness of breath or wheezing or fever. He does have upset stomach/nausea but no vomiting or diarrhea.  All of the symptoms have made his chronic disequilibrium syndrome worse.  No headaches, no falls. He occasionally has some "swirling "vision irregularity but these are not connected with any headache. No urine incontinence. No problem with nausea or vomiting other than with this acute illness he has some nausea  Allergies  Allergen Reactions   Methylphenidate Palpitations    Pt reported; confirmed he wanted med added to his allergy list   Oxycodone Itching and Other (See Comments)    Nightmares, hallucinations   Penicillins Other (See Comments)    Unknown childhood reaction   -COVID-19 vaccine status:  Immunization History  Administered Date(s) Administered   Fluad Quad(high Dose 65+) 12/19/2018, 01/24/2020   Influenza, High Dose Seasonal PF 06/02/2018   PFIZER(Purple Top)SARS-COV-2  Vaccination 04/30/2019, 05/23/2019, 03/28/2020   Pneumococcal Conjugate-13 02/26/2017   Pneumococcal Polysaccharide-23 06/30/2011   Tdap 12/21/2020   Zoster Recombinat (Shingrix) 06/02/2018, 09/11/2020   Zoster, Live 06/30/2011     ROS: See pertinent positives and negatives per HPI.  Past Medical History:  Diagnosis Date   Adult ADHD    adderall caused elevated bp   Anxiety and depression    in past/ due to back injuries   At risk for sleep apnea    STOP-BANG= 5      SENT TO PCP 11-05-2014   Benign paroxysmal positional vertigo    Branch retinal artery occlusion of right eye    COVID-19 virus infection 09/2020   Depression    Disequilibrium syndrome    Carotid dopplers and MRI brain NORMAL x 2 (including eighth nerve studies on most recent MRI 08/2016).   Erectile dysfunction    Failed back surgical syndrome    Dr. Sherley Bounds 04/16/20->ref to pain mgmt-> SPINAL CORD STIMULATOR helpful (2022)   Gross hematuria    passed a stone prior to the planned CT 10/2020   Heart murmur    History of concussion    june 2015--  hit head w/ syncope---  no residual   History of memory loss    per prior PCP records./ per pt due to medication   History of syncope    june 2015 --syncope w/ fall and pt states has happened 5 more times , the last one being jan 2016,,  states had battery of test and unknown cause   Hx of adenomatous colonic polyps 05/25/2017  Recall 05/2022   Hyperlipidemia 02/2016   atorva 02/2016---lipid #s worse so changed to generic crestor.  Much improved on crestor.   IFG (impaired fasting glucose) 02/2016   HbA1c 5 %.   Insomnia    Moderate aortic stenosis    Plan repeat echo 03/2022   Myalgia and myositis, unspecified    Nephrolithiasis    passed stone 10/2020   OAB (overactive bladder)    Osteoarthritis, multiple sites    C-spine, L spine, knees, "all my joints basically"   Prostate cancer [facility]) urologist-  dr ottelin/  oncologist-  dr Valere Dross   2016 Stage T1c,   Gleason 4+3,  PSA 5.26, vol 49.19cc.  No biochemical recurrence as of 11/2020.   RLS (restless legs syndrome)    Spinal stenosis, lumbar region with neurogenic claudication    s/p L3-S1 fusion revision 09/2018. Pain worse 05/2019->MRI showed ankylosis + surg fusion, L2-3 being the only mobile level of lower spine. SPINAL CORD STIMULATOR->helpful.   Thrombocytopenia (HCC)    >100K.  Mild, chronic   Upper airway cough syndrome 2018   Dr. Halford Chessman.  Cough therapy maximized at f/u 07/2016; referred to ENT by Pulm.  Allergy testing OK. Improved as of 08/05/16 pulm f/u.   Vertebrobasilar insufficiency    per neurologist note dr Felecia Shelling-- this is possible causing syncope-- no tx just be careful getting up from sitting position   Wears glasses    White coat syndrome without hypertension     Past Surgical History:  Procedure Laterality Date   Carotid Dopplers  08/01/2014   NORMAL (Dr. Felecia Shelling)   [place]  last one 2010   2 times   COLONOSCOPY  02/2004; 05/2017   2019: adenomatous polyp, int hem, diverticulosis, radiation proctitis.  Recall 05/2022.   EYE SURGERY Bilateral 2013   5 right eye, 3 left eye surgeries (including cataract extraction's iol w/ lens implant, other surgery's for post-op lens fungal infection and floaters)   GOLD SEED IMPLANT N/A 02/22/2015   Procedure: GOLD SEED IMPLANT;  Surgeon: Kathie Rhodes, MD;  Location: [facility];  Service: Urology;  Laterality: N/A;   LEFT THIGH QUADRICEP MUSCLE BIOPSY'S  12/31/2005   LUMBAR FUSION  x2  last one 2010   4 surgeries in back per pt   MRA/MRI w/out contrast--brain  12/2014   mild, age-related chronic microvascular ischemic change (Dr. Felecia Shelling).   RADIOACTIVE SEED IMPLANT N/A 05/17/2015   Procedure: RADIOACTIVE SEED IMPLANT/BRACHYTHERAPY IMPLANT;  Surgeon: Kathie Rhodes, MD;  Location: [place];  Service: Urology;  Laterality: N/A;   SPINAL CORD STIMULATOR IMPLANT     Spinal cord stimulator implantation   06/2020   HELPS!   TRANSRECTAL ULTRASOUND N/A 11/12/2014   Procedure: TRANSRECTAL ULTRASOUND AND BIOPSY  ;  Surgeon: Kathie Rhodes, MD;  Location: [facility];  Service: Urology;  Laterality: N/A;   TRANSTHORACIC ECHOCARDIOGRAM  03/29/2014   mild concentric LVH/  ef 55-60%/  grd I DD. AV poorly visualized, possibly bicupid;  mild AS, valve area 1.78cm^2, mean grandient 62mm Hg/  trivial TR.  03/21/21: EF 60-65%, mod aortic stenosis (mean 21, peak 35, area 1.14 cm2), aortic valve is tricuspid,, mild MR, nl LV syst fxn, grd I DD.     Current Outpatient Medications:    amphetamine-dextroamphetamine (ADDERALL XR) 30 MG 24 hr capsule, 2 caps po qAM, Disp: 60 capsule, Rfl: 0   b complex vitamins capsule, Take 1 capsule by mouth daily., Disp: , Rfl:    celecoxib (  CELEBREX) 200 MG capsule, Take 1 capsule (200 mg total) by mouth daily., Disp: 90 capsule, Rfl: 3   Magnesium 500 MG TABS, Take 500 mg by mouth daily., Disp: , Rfl:    meclizine (ANTIVERT) 25 MG tablet, TAKE 1 TABLET BY MOUTH THREE TIMES A DAY AS NEEDED FOR DIZZINESS, Disp: 30 tablet, Rfl: 0   Melatonin 3 MG CAPS, Take by mouth., Disp: , Rfl:    Multiple Vitamin (MULTIVITAMIN) tablet, Take 1 tablet by mouth daily., Disp: , Rfl:    rOPINIRole (REQUIP) 1 MG tablet, 5 tabs po qhs, Disp: 450 tablet, Rfl: 3   rosuvastatin (CRESTOR) 10 MG tablet, TAKE 1 TABLET BY MOUTH EVERY DAY, Disp: 90 tablet, Rfl: 3   tadalafil (CIALIS) 5 MG tablet, Take 5 mg by mouth daily., Disp: , Rfl:    tamsulosin (FLOMAX) 0.4 MG CAPS capsule, Take 0.4 mg by mouth daily., Disp: , Rfl:    TART CHERRY PO, Take by mouth daily., Disp: , Rfl:    traZODone (DESYREL) 50 MG tablet, TAKE 1 TO 2 TABLETS BY MOUTH EVERY DAY AT BEDTIME AS NEEDED FOR INSOMNIA, Disp: 30 tablet, Rfl: 1   VITAMIN D PO, Take 50,000 Units by mouth 2 (two) times daily., Disp: , Rfl:   EXAM:  VITALS per patient if applicable:  Vitals with BMI 03/25/2021 02/24/2021 01/27/2021  Height - 6\' 3"   6\' 3"   Weight 235 lbs 240 lbs 13 oz 242 lbs 6 oz  BMI 29.37 45.4 09.8  Systolic 119 147 829  Diastolic 70 82 76  Pulse - 87 55   Temp 98.1 today  GENERAL: alert, oriented, appears tired but in no acute distress.  Lying supine on his bed.  HEENT: atraumatic, conjunttiva clear, no obvious abnormalities on inspection of external nose and ears  NECK: normal movements of the head and neck  LUNGS: on inspection no signs of respiratory distress, breathing rate appears normal, no obvious gross SOB, gasping or wheezing  CV: no obvious cyanosis  MS: moves all visible extremities without noticeable abnormality  PSYCH/NEURO: pleasant and cooperative, no obvious depression or anxiety, speech and thought processing grossly intact  LABS: none today    Chemistry      Component Value Date/Time   NA 141 01/27/2021 1359   NA 141 09/26/2018 0000   K 4.3 01/27/2021 1359   CL 108 01/27/2021 1359   CO2 26 01/27/2021 1359   BUN 16 01/27/2021 1359   BUN 20 09/26/2018 0000   CREATININE 0.82 01/27/2021 1359   GLU 95 09/26/2018 0000      Component Value Date/Time   CALCIUM 8.7 01/27/2021 1359   ALKPHOS 68 09/11/2020 1101   AST 22 01/27/2021 1359   ALT 21 01/27/2021 1359   BILITOT 0.8 01/27/2021 1359      ASSESSMENT AND PLAN:  Discussed the following assessment and plan:  #1 URI with cough and congestion, suspect influenza. Possible bacterial superinfection given prolonged symptoms. He is out of the treatment window for antivirals. Will treat with doxycycline x10 days(penicillin allergy) and prescribe promethazine/guaifenacin cough med.  Stop meclizine while on this medication.  #2 chronic disequilibrium syndrome.  Symptoms wax and wane and sometimes are more consistent with vertigo but most times consistent with nonspecific dizziness/disequilibrium, patient describes as "feeling drunk ".  Symptoms not consistently orthostatic/positional. Remote history of neurologic eval, MRI imaging  without identifiable cause. Will ask neurology to reevaluate.   I discussed the assessment and treatment plan with the patient. The patient was provided  an opportunity to ask questions and all were answered. The patient agreed with the plan and demonstrated an understanding of the instructions.   F/U: if not signif improved in 4-5 d  Signed:  Crissie Sickles, MD           03/25/2021

## 2021-03-25 NOTE — Telephone Encounter (Signed)
Pt med refill needing    amphetamine-dextroamphetamine (ADDERALL XR) 30 MG 24 hr capsule  [id number] [place], [place] - [address] AT [address] Phone:  [phone]  Fax:  [phone]

## 2021-03-25 NOTE — Telephone Encounter (Signed)
Requesting: adderral Contract: 01/24/20 UDS: 09/11/20 Last Visit:02/24/21 Next Visit: Last Refill: 11/21/20 (60,0)  Please Advise

## 2021-03-26 MED ORDER — TRIAMCINOLONE ACETONIDE 40 MG/ML IJ SUSP
40.0000 mg | Freq: Once | INTRAMUSCULAR | Status: AC
  Administered 2021-02-24: 40 mg via INTRA_ARTICULAR

## 2021-03-26 NOTE — Addendum Note (Signed)
Addended by: Deveron Furlong D on: 03/26/2021 10:50 AM   Modules accepted: Orders

## 2021-03-31 ENCOUNTER — Telehealth: Payer: Self-pay | Admitting: Family Medicine

## 2021-03-31 ENCOUNTER — Other Ambulatory Visit: Payer: Self-pay | Admitting: Family Medicine

## 2021-03-31 NOTE — Telephone Encounter (Signed)
Pt called back, said there's a nationwide shortage on adderall. They don't know when medication will be back in stock.  Pt said Pharmacist informed pt of another adderall,however his insurance dosen't cover that kind. Pt wants to know is there another alternative.--KR  [phone]

## 2021-03-31 NOTE — Telephone Encounter (Signed)
Noted  

## 2021-03-31 NOTE — Telephone Encounter (Signed)
FYI. Please see below.  Pt advised regarding instructions. He was able to get qty of 25 for adderall 30mg  tablets but that would only last him 12 days. He mentioned only taking 1 tab po until he figures out alternative from suggestions given below.

## 2021-03-31 NOTE — Telephone Encounter (Signed)
He should be able to pick up the cough syrup I prescribed on 03/25/2021. No office visit needed at this time.

## 2021-03-31 NOTE — Telephone Encounter (Signed)
Pt is still taking antibiotic and was unaware he had 1 refill left on promethazine syrup. Should he keep appt ?  Regarding Adderall rx refill, pt advised to [facility] other [facility] to see if they had med in stock and let us know. He voiced understanding

## 2021-03-31 NOTE — Telephone Encounter (Signed)
Pt called said cough isn't helping,scheduled pt for VV. Pt said he has no fever tested negative for COVID. Pt asked is he going to get cough syrup like the last time. He said that seems  to help his cough--KR  Pt out of medication   amphetamine-dextroamphetamine amphetamine-dextroamphetamine (ADDERALL XR) 30 MG 24 hr capsule

## 2021-03-31 NOTE — Telephone Encounter (Signed)
Have pt call his insurer and see whether any of the following are preferred on his formulary: vyvanse, concerta, metadate, ritalin LA, or dexedrine. Let me know.

## 2021-03-31 NOTE — Telephone Encounter (Signed)
Pt was advised of instructions. Please see additional message below regarding adderall and advise.

## 2021-04-03 ENCOUNTER — Telehealth: Payer: 59 | Admitting: Family Medicine

## 2021-04-04 ENCOUNTER — Ambulatory Visit: Payer: PPO | Admitting: Family Medicine

## 2021-04-04 ENCOUNTER — Other Ambulatory Visit: Payer: Self-pay

## 2021-04-04 ENCOUNTER — Encounter: Payer: Self-pay | Admitting: Family Medicine

## 2021-04-04 VITALS — BP 155/83 | HR 89 | Temp 97.8°F | Ht 75.0 in | Wt 240.0 lb

## 2021-04-04 DIAGNOSIS — J209 Acute bronchitis, unspecified: Secondary | ICD-10-CM

## 2021-04-04 DIAGNOSIS — F988 Other specified behavioral and emotional disorders with onset usually occurring in childhood and adolescence: Secondary | ICD-10-CM | POA: Diagnosis not present

## 2021-04-04 DIAGNOSIS — R051 Acute cough: Secondary | ICD-10-CM | POA: Diagnosis not present

## 2021-04-04 LAB — POCT INFLUENZA A/B
Influenza A, POC: NEGATIVE
Influenza B, POC: NEGATIVE

## 2021-04-04 LAB — POC COVID19 BINAXNOW: SARS Coronavirus 2 Ag: NEGATIVE

## 2021-04-04 MED ORDER — PROMETHAZINE-DM 6.25-15 MG/5ML PO SYRP: ORAL_SOLUTION | ORAL | 1 refills | Status: DC

## 2021-04-04 MED ORDER — CLINDAMYCIN HCL 300 MG PO CAPS: 300.0000 mg | ORAL_CAPSULE | Freq: Three times a day (TID) | ORAL | 0 refills | Status: DC

## 2021-04-04 MED ORDER — PREDNISONE 20 MG PO TABS: ORAL_TABLET | ORAL | 0 refills | Status: DC

## 2021-04-04 NOTE — Progress Notes (Signed)
OFFICE VISIT  04/04/2021  CC:  Chief Complaint  Patient presents with   Follow-up    Respiratory symptoms; finished doxycycline and promethazine cough syrup (2 rounds)   HPI:    Patient is a 76 y.o. male who presents for ongoing respiratory symptoms. I saw him (telemed) 03/25/21. A/P as of that visit: "#1 URI with cough and congestion, suspect influenza. Possible bacterial superinfection given prolonged symptoms. He is out of the treatment window for antivirals. Will treat with doxycycline x10 days(penicillin allergy) and prescribe promethazine/guaifenacin cough med.  Stop meclizine while on this medication.   #2 chronic disequilibrium syndrome.  Symptoms wax and wane and sometimes are more consistent with vertigo but most times consistent with nonspecific dizziness/disequilibrium, patient describes as "feeling drunk ".  Symptoms not consistently orthostatic/positional. Remote history of neurologic eval, MRI imaging without identifiable cause. Will ask neurology to reevaluate."  HPI: Ongoing respiratory symptoms x3 weeks. Sounds like the severe malaise and dizziness have improved significantly. Nasal congestion, postnasal drip, sinus pressure, and cough persist. No fevers, no shortness of breath, no wheezing. Promethazine/dextromethorphan cough syrup did help some. Doxycycline helped a little.  Past Medical History:  Diagnosis Date   Adult ADHD    adderall caused elevated bp   Anxiety and depression    in past/ due to back injuries   At risk for sleep apnea    STOP-BANG= 5      SENT TO PCP 11-05-2014   Benign paroxysmal positional vertigo    Branch retinal artery occlusion of right eye    COVID-19 virus infection 09/2020   Depression    Disequilibrium syndrome    Carotid dopplers and MRI brain NORMAL x 2 (including eighth nerve studies on most recent MRI 08/2016).   Erectile dysfunction    Failed back surgical syndrome    Dr. Sherley Bounds 04/16/20->ref to pain mgmt-> SPINAL  CORD STIMULATOR helpful (2022)   Gross hematuria    passed a stone prior to the planned CT 10/2020   Heart murmur    History of concussion    june 2015--  hit head w/ syncope---  no residual   History of memory loss    per prior PCP records./ per pt due to medication   History of syncope    june 2015 --syncope w/ fall and pt states has happened 5 more times , the last one being jan 2016,,  states had battery of test and unknown cause   Hx of adenomatous colonic polyps 05/25/2017   Recall 05/2022   Hyperlipidemia 02/2016   atorva 02/2016---lipid #s worse so changed to generic crestor.  Much improved on crestor.   IFG (impaired fasting glucose) 02/2016   HbA1c 5 %.   Insomnia    Moderate aortic stenosis    Plan repeat echo 03/2022   Myalgia and myositis, unspecified    Nephrolithiasis    passed stone 10/2020   OAB (overactive bladder)    Osteoarthritis, multiple sites    C-spine, L spine, knees, "all my joints basically"   Prostate cancer [facility]) urologist-  dr ottelin/  oncologist-  dr Valere Dross   2016 Stage T1c,  Gleason 4+3,  PSA 5.26, vol 49.19cc.  No biochemical recurrence as of 11/2020.   RLS (restless legs syndrome)    Spinal stenosis, lumbar region with neurogenic claudication    s/p L3-S1 fusion revision 09/2018. Pain worse 05/2019->MRI showed ankylosis + surg fusion, L2-3 being the only mobile level of lower spine. SPINAL CORD STIMULATOR->helpful.   Thrombocytopenia (HCC)    >  100K.  Mild, chronic   Upper airway cough syndrome 2018   Dr. Halford Chessman.  Cough therapy maximized at f/u 07/2016; referred to ENT by Pulm.  Allergy testing OK. Improved as of 08/05/16 pulm f/u.   Vertebrobasilar insufficiency    per neurologist note dr Felecia Shelling-- this is possible causing syncope-- no tx just be careful getting up from sitting position   Wears glasses    White coat syndrome without hypertension     Past Surgical History:  Procedure Laterality Date   Carotid Dopplers  08/01/2014   NORMAL (Dr. Felecia Shelling)    [place]  last one 2010   2 times   COLONOSCOPY  02/2004; 05/2017   2019: adenomatous polyp, int hem, diverticulosis, radiation proctitis.  Recall 05/2022.   EYE SURGERY Bilateral 2013   5 right eye, 3 left eye surgeries (including cataract extraction's iol w/ lens implant, other surgery's for post-op lens fungal infection and floaters)   GOLD SEED IMPLANT N/A 02/22/2015   Procedure: GOLD SEED IMPLANT;  Surgeon: Kathie Rhodes, MD;  Location: [facility];  Service: Urology;  Laterality: N/A;   LEFT THIGH QUADRICEP MUSCLE BIOPSY'S  12/31/2005   LUMBAR FUSION  x2  last one 2010   4 surgeries in back per pt   MRA/MRI w/out contrast--brain  12/2014   mild, age-related chronic microvascular ischemic change (Dr. Felecia Shelling).   RADIOACTIVE SEED IMPLANT N/A 05/17/2015   Procedure: RADIOACTIVE SEED IMPLANT/BRACHYTHERAPY IMPLANT;  Surgeon: Kathie Rhodes, MD;  Location: [place];  Service: Urology;  Laterality: N/A;   SPINAL CORD STIMULATOR IMPLANT     Spinal cord stimulator implantation  06/2020   HELPS!   TRANSRECTAL ULTRASOUND N/A 11/12/2014   Procedure: TRANSRECTAL ULTRASOUND AND BIOPSY  ;  Surgeon: Kathie Rhodes, MD;  Location: [facility];  Service: Urology;  Laterality: N/A;   TRANSTHORACIC ECHOCARDIOGRAM  03/29/2014   mild concentric LVH/  ef 55-60%/  grd I DD. AV poorly visualized, possibly bicupid;  mild AS, valve area 1.78cm^2, mean grandient 43mm Hg/  trivial TR.  03/21/21: EF 60-65%, mod aortic stenosis (mean 21, peak 35, area 1.14 cm2), aortic valve is tricuspid,, mild MR, nl LV syst fxn, grd I DD.    Outpatient Medications Prior to Visit  Medication Sig Dispense Refill   amphetamine-dextroamphetamine (ADDERALL XR) 30 MG 24 hr capsule 2 caps po qAM 60 capsule 0   b complex vitamins capsule Take 1 capsule by mouth daily.     celecoxib (CELEBREX) 200 MG capsule Take 1 capsule (200 mg total) by mouth daily. 90 capsule 3   Magnesium 500 MG TABS  Take 500 mg by mouth daily.     meclizine (ANTIVERT) 25 MG tablet TAKE 1 TABLET BY MOUTH THREE TIMES A DAY AS NEEDED FOR DIZZINESS 30 tablet 0   Melatonin 3 MG CAPS Take by mouth.     Multiple Vitamin (MULTIVITAMIN) tablet Take 1 tablet by mouth daily.     rOPINIRole (REQUIP) 1 MG tablet 5 tabs po qhs 450 tablet 3   rosuvastatin (CRESTOR) 10 MG tablet TAKE 1 TABLET BY MOUTH EVERY DAY 90 tablet 3   tadalafil (CIALIS) 5 MG tablet Take 5 mg by mouth daily.     tamsulosin (FLOMAX) 0.4 MG CAPS capsule Take 0.4 mg by mouth daily.     TART CHERRY PO Take by mouth daily.     traZODone (DESYREL) 50 MG tablet TAKE 1 TO 2 TABLETS BY MOUTH EVERY DAY AT BEDTIME AS NEEDED FOR  INSOMNIA 30 tablet 1   VITAMIN D PO Take 50,000 Units by mouth 2 (two) times daily.     doxycycline (VIBRAMYCIN) 100 MG capsule Take 1 capsule (100 mg total) by mouth 2 (two) times daily for 10 days. (Patient not taking: Reported on 04/04/2021) 20 capsule 0   promethazine-dextromethorphan (PROMETHAZINE-DM) 6.25-15 MG/5ML syrup 1-2 tsp q8h prn (Patient not taking: Reported on 04/04/2021) 118 mL 1   No facility-administered medications prior to visit.    Allergies  Allergen Reactions   Methylphenidate Palpitations    Pt reported; confirmed he wanted med added to his allergy list   Oxycodone Itching and Other (See Comments)    Nightmares, hallucinations   Penicillins Other (See Comments)    Unknown childhood reaction    ROS As per HPI  PE: Vitals with BMI 04/04/2021 03/25/2021 02/24/2021  Height 6\' 3"  - 6\' 3"   Weight 240 lbs 235 lbs 240 lbs 13 oz  BMI 30 36.62 94.7  Systolic 654 650 354  Diastolic 83 70 82  Pulse 89 - 87  02 sat 96% on RA today  Physical Exam  VS: noted--normal. Gen: alert, NAD, NONTOXIC APPEARING. HEENT: eyes without injection, drainage, or swelling.  Ears: EACs clear, TMs with normal light reflex and landmarks.  Nose: Clear rhinorrhea, with some dried, crusty exudate adherent to mildly injected mucosa.   No purulent d/c.  No paranasal sinus TTP.  No facial swelling.  Throat and mouth without focal lesion.  No pharyngial swelling, erythema, or exudate.   Neck: supple, no LAD.   LUNGS: CTA bilat, nonlabored resps.   CV: RRR, 3/6 harsh systolic murmur (unchanged), no diastolic murmur, no r/g. EXT: no c/c/e SKIN: no rash  LABS:  Last CBC Lab Results  Component Value Date   WBC 4.6 09/03/2020   HGB 14.7 09/03/2020   HCT 45.1 09/03/2020   MCV 92.0 09/03/2020   MCH 30.0 09/03/2020   RDW 13.4 09/03/2020   PLT 102 (L) 65/68/1275   Last metabolic panel Lab Results  Component Value Date   GLUCOSE 96 01/27/2021   NA 141 01/27/2021   K 4.3 01/27/2021   CL 108 01/27/2021   CO2 26 01/27/2021   BUN 16 01/27/2021   CREATININE 0.82 01/27/2021   GFRNONAA >60 09/03/2020   CALCIUM 8.7 01/27/2021   PROT 5.8 (L) 01/27/2021   ALBUMIN 4.3 09/11/2020   LABGLOB 2.7 07/06/2014   AGRATIO 1.4 07/06/2014   BILITOT 0.8 01/27/2021   ALKPHOS 68 09/11/2020   AST 22 01/27/2021   ALT 21 01/27/2021   ANIONGAP 6 09/03/2020   Flu A/B and COVID swabs today in the office were negative.  IMPRESSION AND PLAN:  Acute respiratory illness, prolonged. Exam reassuring today.  Flu and COVID-negative.  Unfortunately he has not felt very well since having COVID back in July 2022 (residual cough, HAs, upset stomach). Prednisone 40 mg a day x5 days, then 20 mg a day x5 days. Clindamycin 300 mg 3 times daily x10 days (penicillin allergy, dx of prolonged QT syndrome). Refilled promethazine/dextromethorphan cough syrup, 120 mL, refill x1. Avoid cough drops.  Use hard candy to encourage swallowing rather than throat clearing.  Of note, he has adult ADD and has been on Adderall for years.  There is a Producer, television/film/video of this medication and its generic form. His symptoms are severe if not treated with this medication. He recalls a trial of Ritalin in the remote past and says it caused many side effects. He is going to  check back with his pharmacy to see if any more generic has been received and if not he will have to either pay out-of-pocket for Adderall XR brand-name or he can check with his insurer about coverage for Vyvanse or Evekeo.  An After Visit Summary was printed and given to the patient.  FOLLOW UP: No follow-ups on file.  Signed:  Crissie Sickles, MD           04/04/2021

## 2021-04-11 ENCOUNTER — Other Ambulatory Visit: Payer: Self-pay | Admitting: Family Medicine

## 2021-04-11 MED ORDER — LISDEXAMFETAMINE DIMESYLATE 70 MG PO CAPS: 70.0000 mg | ORAL_CAPSULE | Freq: Every day | ORAL | 0 refills | Status: DC

## 2021-04-11 NOTE — Telephone Encounter (Signed)
Spoke with patient regarding results/recommendations.  

## 2021-04-11 NOTE — Telephone Encounter (Signed)
Pt [facility] on refill, [facility]   amphetamine-dextroamphetamine amphetamine-dextroamphetamine (ADDERALL XR) 30 MG 24 hr capsule     [id number] [place], [place] - [address] AT [address] Phone:  [phone]  Fax:  [phone]

## 2021-04-11 NOTE — Telephone Encounter (Signed)
Andrew Stevenson Key: [id number] - PA Case ID: [id number]  Would you like to have PA completed or would you like to send in different Rx?

## 2021-04-11 NOTE — Telephone Encounter (Signed)
Pt called on refill, [facility]  amphetamine-dextroamphetamine amphetamine-dextroamphetamine (ADDERALL XR) 30 MG 24 hr capsule   [id number] [place], [place] - [address] AT [address] Phone:  [phone]  Fax:  [phone]

## 2021-04-11 NOTE — Telephone Encounter (Addendum)
Pt called back and said Insurance changed at the first of the year they have to have Prior auth done for adderall and have it marked urgent and say medical necessity only, even though hes been on it for a while. He said he would prefer to stay on the same thing but that his insurance did suggest some alternatives for Dr Anitra Lauth to review, They are: Azstarys, Jornaypm, Mydaxis, and Vyvanse (no prior auth required for these).  He said he leaves to go out of town in 10 days and would like this to be handled before then, he said he is almost out

## 2021-04-11 NOTE — Telephone Encounter (Signed)
I suspect the PA won't go through b/c he hasn't tried any of the alternatives on his insurance formulary. Therefore, I'll send in rx for vyvanse.  Tell him this is essentially a long acting form of his adderall.

## 2021-04-14 ENCOUNTER — Telehealth: Payer: Self-pay

## 2021-04-14 MED ORDER — AMPHETAMINE-DEXTROAMPHET ER 30 MG PO CP24: ORAL_CAPSULE | ORAL | 0 refills | Status: DC

## 2021-04-14 NOTE — Telephone Encounter (Signed)
Requesting: adderall Contract: 01/24/20 UDS: 09/11/20 Last Visit:04/04/21(acute) Next Visit: N/A Last Refill: 03/25/21(60,0)  Please Advise. Med pending

## 2021-04-14 NOTE — Telephone Encounter (Signed)
Pt states he needs a letter stating that he was not able to do a 12 hour flight because of his back pain. Spoke with PCP and will need more information on what is needed in this letter (dates/cause)

## 2021-04-15 ENCOUNTER — Telehealth: Payer: Self-pay | Admitting: Family Medicine

## 2021-04-15 NOTE — Telephone Encounter (Signed)
Letter placed at the front desk, pt made aware and will be coming by to pick up today.

## 2021-04-15 NOTE — Telephone Encounter (Signed)
Letter done/printed.

## 2021-04-15 NOTE — Telephone Encounter (Signed)
LM for pt regarding medication ?

## 2021-04-15 NOTE — Telephone Encounter (Signed)
OK, letter printed. 

## 2021-04-15 NOTE — Telephone Encounter (Signed)
Pt advised refill sent. °

## 2021-04-15 NOTE — Telephone Encounter (Signed)
LM for pt to returncall

## 2021-04-15 NOTE — Telephone Encounter (Signed)
Spoke with pt and he states he was supposed to go on a 10 day trip(2/8-2/18) to [place] with his wife. He is unable sit for the entire 12 hours due to a pulled muscle. In order for him to get his money back, he needs a letter from his PCP.   Please review and advise

## 2021-04-24 ENCOUNTER — Telehealth: Payer: Self-pay | Admitting: Family Medicine

## 2021-04-24 DIAGNOSIS — R42 Dizziness and giddiness: Secondary | ICD-10-CM

## 2021-04-24 DIAGNOSIS — E878 Other disorders of electrolyte and fluid balance, not elsewhere classified: Secondary | ICD-10-CM

## 2021-04-24 NOTE — Telephone Encounter (Signed)
Caller Name: Raunak, pt Call back phone #: [phone]  REFERRAL REQUEST Has patient seen PCP for this complaint? Yes.   Referral for which specialty: ENT Preferred provider/office: [facility] Reason for referral: vertigo

## 2021-04-24 NOTE — Telephone Encounter (Signed)
Pt was given neurology referral on 1/10. Nothing new.   Please review and advise on ENT referral request.

## 2021-04-25 NOTE — Telephone Encounter (Signed)
Referral entered  

## 2021-04-25 NOTE — Telephone Encounter (Signed)
Pt advised new referral placed, ENT office should contact to schedule appt

## 2021-04-28 ENCOUNTER — Telehealth: Payer: Self-pay | Admitting: Family Medicine

## 2021-04-28 NOTE — Telephone Encounter (Signed)
Pt said referral that was sent office called him. They are no longer accepting new patients with insurance [employer].  Pt needing referral to another office that accepts his insurance. -KR

## 2021-05-01 ENCOUNTER — Other Ambulatory Visit: Payer: Self-pay

## 2021-05-01 MED ORDER — MECLIZINE HCL 25 MG PO TABS: ORAL_TABLET | ORAL | 3 refills | Status: DC

## 2021-05-01 NOTE — Telephone Encounter (Signed)
Patient refill request.   [facility]  Patient stated he may have resolved why he is so dizzy.  Back in December (?) when he was sick with COVID,etc, Dr. Anitra Lauth prescribed prednisone and told patient NOT to take Meclizine while he was on Prednisone.  Even [facility] told him to stop taking.  He forgot all about to start taking Meclizine again after finishing up Prednisone.  Current bottles has 2 pills left, needs refill [facility] into [facility].  meclizine (ANTIVERT) 25 MG tablet [[id number]]

## 2021-05-01 NOTE — Telephone Encounter (Signed)
Last rx written 02/10/21(30,0)  Please fill, if appropriate.

## 2021-05-01 NOTE — Telephone Encounter (Signed)
Patient advised medication sent.

## 2021-05-02 NOTE — Telephone Encounter (Signed)
Spoke with patient regarding results/recommendations.  

## 2021-05-15 ENCOUNTER — Telehealth: Payer: Self-pay

## 2021-05-15 MED ORDER — AMPHETAMINE-DEXTROAMPHET ER 30 MG PO CP24: ORAL_CAPSULE | ORAL | 0 refills | Status: DC

## 2021-05-15 NOTE — Telephone Encounter (Signed)
RF request for adderall ?LOV: 04/04/21 ?Next ov:  n/a ?Last written: 04/14/21(60,0) ? ? ?Last rx written for Meclizine was 05/01/21 (30,3) 1 po tid.  ? ? ?Please review and advise. Rx pending for Adderall  ?

## 2021-05-15 NOTE — Telephone Encounter (Signed)
Patient requesting refill on vertigo medication and Adderall.  He said he only received a 15 D/S for the Meclizine. ?New prescription is requested for 30 d/s. ? ?[facility] ? ?meclizine (ANTIVERT) 25 MG tablet [[id number]]  ? ?amphetamine-dextroamphetamine (ADDERALL XR) 30 MG 24 hr capsule [[id number]]  ? ? ?

## 2021-05-16 NOTE — Telephone Encounter (Signed)
Pt advised refill sent. °

## 2021-05-19 MED ORDER — AMPHETAMINE-DEXTROAMPHET ER 30 MG PO CP24: 30.0000 mg | ORAL_CAPSULE | Freq: Every day | ORAL | 0 refills | Status: DC

## 2021-05-19 NOTE — Telephone Encounter (Signed)
PA sent via covermymed on 05/19/21 ?  ?Key: [id number] - PA Case ID: [id number] ?  ?Medication: Adderall XR '30mg'$  ?  ?Dx: F98.8 or F90.8, ADD in adult ?  ?Per Dr. Anitra Lauth pt has tried and failed methylphenidate due to causing palpitations. ?  ?Waiting for response. LM to advise pt PA in process.  ? ?Available Formulary Alternatives: amphetamine-dextroamphetamine mixed salts ext-rel, dexmethylphenidate ext-rel, methylphenidate ext-rel, AZSTARYS, JORNAY PM, MYDAYIS, VYVANSE ? ? ?Please advise regarding referral request. Referral sent to [facility] on 2/14. ? ?

## 2021-05-19 NOTE — Telephone Encounter (Signed)
I sent prescription in on 05/15/2021 for 30-day supply.  Tell him I am sorry but I cannot dispense more than 30 days at a time.   ?I will send in an additional prescription now-> to fill in 1 month. ?

## 2021-05-19 NOTE — Telephone Encounter (Signed)
Pt needing  refill for med below ? ?[facility] now has medication in stock, with pt insurance they are saying that. Doctor has to give an okay for medication. ? ?Pt wanting to know can he get a supply for next month as well. He wants to be prepared just in case there's another shortage. ?Informed him i'll send a note back, I can't guarantee that.  ? ? ? ?amphetamine-dextroamphetamine ?amphetamine-dextroamphetamine (ADDERALL XR) 30 MG 24 hr capsule ? ? ?[id number] [place] [place] [facility] [address]:  [phone] ?Fax:  [phone] ?  ? ? ?

## 2021-05-19 NOTE — Telephone Encounter (Signed)
Pt's last refill was 05/15/21(60,0) at [facility] ? ?Please review and advise ? ?

## 2021-05-19 NOTE — Telephone Encounter (Signed)
Pt advised regarding rx refill. He stated there was a term the pharmacy used to describe that he needed the medication. He was unable to recall at the time but will call back once he finds out from the pharmacy. ?

## 2021-05-19 NOTE — Telephone Encounter (Signed)
(  1) Patient came to office stating insurance is requesting prior auth before filling Adderall XR '30mg'$  24 hr capsule ?[facility] ? ?(2) He also is requesting referral to [facility] for his vertigo.  ? ?Patient can be reached at [phone]. ? ? ? ? ?

## 2021-05-20 NOTE — Telephone Encounter (Signed)
Okay, correct me if I am wrong but it appears that Adderall XR --the brand name--is the medication that the insurance denied. ? ?Due to the Adderall shortage recently, I prescribed GENERIC Adderall XR (dextroamphetamine-amphetamine ER '30mg'$  cap), which he was only able to get 25 of. ?I then prescribed him Vyvanse, which he filled January 27, #30. ? ?The decision he has to make is:  does he want me to prescribe the generic form of Adderall XR again or does he want me to prescribe Vyvanse? ?These are all insurance -mandated changes and unfortunately we have to try to work within them.  We have no choice. ? ?Let me know. ? ?

## 2021-05-20 NOTE — Telephone Encounter (Signed)
Pt states Vyvanse does not work for him. He would prefer the generic Adderall XR ?

## 2021-05-20 NOTE — Telephone Encounter (Signed)
Pt was informed of denial. Please review and advise  ? ?Why your request was denied: ?Coverage for this medication is denied for the following reason(s). We reviewed the information we ?received about your condition and circumstances. We used plan approved criteria when making this ?decision. The policy states that this medication may be approved when: ?-The member is unable to take the required number of formulary alternatives for the given diagnosis ?due to an intolerance or contraindication OR ?-The member has tried and failed the required number of formulary alternatives. ?Based on the policy and the information we received your request is denied. We did not receive ?documentation that you meet the criteria outlined above. ? ?Formulary alternatives are: amphetamine-dextroamphetamine mixed salts ext-rel, dexmethylphenidate ext-rel, methylphenidate ext-rel, AZSTARYS, JORNAY PM, MYDAYIS, VYVANSE. (Requirement: 3 in a class with 3 or more alternatives, 2 in a class with 2 alternatives, or 1 in a class with only 1 ?alternative. ?

## 2021-05-21 MED ORDER — AMPHETAMINE-DEXTROAMPHET ER 30 MG PO CP24: 30.0000 mg | ORAL_CAPSULE | Freq: Every day | ORAL | 0 refills | Status: DC

## 2021-05-21 NOTE — Telephone Encounter (Signed)
Okay, new prescription for Adderall XR 30 mg tabs sent--I specified on the prescription to dispense generic ?

## 2021-05-21 NOTE — Telephone Encounter (Signed)
Pt advised of med update ?

## 2021-05-21 NOTE — Addendum Note (Signed)
Addended by: Tammi Sou on: 05/21/2021 12:14 PM ? ? Modules accepted: Orders ? ?

## 2021-05-21 NOTE — Telephone Encounter (Signed)
New prescription for the generic Adderall sent ?

## 2021-06-03 ENCOUNTER — Encounter: Payer: Self-pay | Admitting: Family Medicine

## 2021-06-06 ENCOUNTER — Other Ambulatory Visit: Payer: Self-pay

## 2021-06-06 ENCOUNTER — Encounter: Payer: Self-pay | Admitting: Family Medicine

## 2021-06-06 ENCOUNTER — Ambulatory Visit: Payer: 59 | Admitting: Family Medicine

## 2021-06-06 VITALS — Ht 75.0 in | Wt 242.4 lb

## 2021-06-06 DIAGNOSIS — M25562 Pain in left knee: Secondary | ICD-10-CM | POA: Diagnosis not present

## 2021-06-06 DIAGNOSIS — M7052 Other bursitis of knee, left knee: Secondary | ICD-10-CM

## 2021-06-06 MED ORDER — TRIAMCINOLONE ACETONIDE 80 MG/ML IJ SUSP: Freq: Once | INTRAMUSCULAR | Status: AC

## 2021-06-06 NOTE — Progress Notes (Signed)
OFFICE VISIT ? ?06/06/2021 ? ?CC:  ?Chief Complaint  ?Patient presents with  ? Knee Pain  ?  Left knee pain, would like another injection. Hurts to stand and applying weight on it  ? ?Patient is a 76 y.o. male who presents for left knee pain. ? ?HPI: ?One mo/ago bent left knee deep to stoop down and pick something up and felt immediate pain and pop.  Swelled up over the next 24-48h, hurts to stand or walk, has to limp. ?Points to medial aspect of knee. ? ?No pain in right knee at this time, just the left. ?(Right knee injection by me 02/24/2021.  Knee x-rays ordered at that time but he did not get them). ? ? ?PMP AWARE reviewed today: most recent rx for generic Adderall XR 30 mg was filled 05/15/2021, #60, rx by me. ?No red flags. ? ?Past Medical History:  ?Diagnosis Date  ? Adult ADHD   ? adderall caused elevated bp  ? Anxiety and depression   ? in past/ due to back injuries  ? At risk for sleep apnea   ? STOP-BANG= 5      SENT TO PCP 11-05-2014  ? Benign paroxysmal positional vertigo   ? Branch retinal artery occlusion of right eye   ? COVID-19 virus infection 09/2020  ? Depression   ? Disequilibrium syndrome   ? Carotid dopplers and MRI brain NORMAL x 2 (including eighth nerve studies on most recent MRI 08/2016).  ? Erectile dysfunction   ? Failed back surgical syndrome   ? Dr. Sherley Bounds 04/16/20->ref to pain mgmt-> SPINAL CORD STIMULATOR helpful (2022)  ? Gross hematuria   ? passed a stone prior to the planned CT 10/2020  ? Heart murmur   ? History of concussion   ? june 2015--  hit head w/ syncope---  no residual  ? History of memory loss   ? per prior PCP records./ per pt due to medication  ? History of syncope   ? june 2015 --syncope w/ fall and pt states has happened 5 more times , the last one being jan 2016,,  states had battery of test and unknown cause  ? Hx of adenomatous colonic polyps 05/25/2017  ? Recall 05/2022  ? Hyperlipidemia 02/2016  ? atorva 02/2016---lipid #s worse so changed to generic crestor.   Much improved on crestor.  ? IFG (impaired fasting glucose) 02/2016  ? HbA1c 5 %.  ? Insomnia   ? Moderate aortic stenosis   ? Plan repeat echo 03/2022  ? Myalgia and myositis, unspecified   ? Nephrolithiasis   ? passed stone 10/2020  ? OAB (overactive bladder)   ? Osteoarthritis, multiple sites   ? C-spine, L spine, knees, "all my joints basically"  ? Prostate cancer [facility]) urologist-  dr ottelin/  oncologist-  dr Valere Dross  ? 2016 Stage T1c,  Gleason 4+3,  PSA 5.26, vol 49.19cc.  No biochemical recurrence as of 11/2020.  ? RLS (restless legs syndrome)   ? Spinal stenosis, lumbar region with neurogenic claudication   ? s/p L3-S1 fusion revision 09/2018. Pain worse 05/2019->MRI showed ankylosis + surg fusion, L2-3 being the only mobile level of lower spine. SPINAL CORD STIMULATOR->helpful.  ? Thrombocytopenia (HCC)   ? >100K.  Mild, chronic  ? Upper airway cough syndrome 2018  ? Dr. Halford Chessman.  Cough therapy maximized at f/u 07/2016; referred to ENT by Pulm.  Allergy testing OK. Improved as of 08/05/16 pulm f/u.  ? Vertebrobasilar insufficiency   ? per  neurologist note dr Felecia Shelling-- this is possible causing syncope-- no tx just be careful getting up from sitting position  ? Wears glasses   ? White coat syndrome without hypertension   ? ? ?Past Surgical History:  ?Procedure Laterality Date  ? Carotid Dopplers  08/01/2014  ? NORMAL (Dr. Felecia Shelling)  ? CERVICAL FUSION  last one 2010  ? 2 times  ? COLONOSCOPY  02/2004; 05/2017  ? 2019: adenomatous polyp, int hem, diverticulosis, radiation proctitis.  Recall 05/2022.  ? EYE SURGERY Bilateral 2013  ? 5 right eye, 3 left eye surgeries (including cataract extraction's iol w/ lens implant, other surgery's for post-op lens fungal infection and floaters)  ? GOLD SEED IMPLANT N/A 02/22/2015  ? Procedure: GOLD SEED IMPLANT;  Surgeon: Kathie Rhodes, MD;  Location: [facility];  Service: Urology;  Laterality: N/A;  ? LEFT THIGH QUADRICEP MUSCLE BIOPSY'S  12/31/2005  ? LUMBAR FUSION  x2  last  one 2010  ? 4 surgeries in back per pt  ? MRA/MRI w/out contrast--brain  12/2014  ? mild, age-related chronic microvascular ischemic change (Dr. Felecia Shelling).  ? RADIOACTIVE SEED IMPLANT N/A 05/17/2015  ? Procedure: RADIOACTIVE SEED IMPLANT/BRACHYTHERAPY IMPLANT;  Surgeon: Kathie Rhodes, MD;  Location: [facility];  Service: Urology;  Laterality: N/A;  ? SPINAL CORD STIMULATOR IMPLANT    ? Spinal cord stimulator implantation  06/2020  ? HELPS!  ? TRANSRECTAL ULTRASOUND N/A 11/12/2014  ? Procedure: TRANSRECTAL ULTRASOUND AND BIOPSY  ;  Surgeon: Kathie Rhodes, MD;  Location: [facility];  Service: Urology;  Laterality: N/A;  ? TRANSTHORACIC ECHOCARDIOGRAM  03/29/2014  ? mild concentric LVH/  ef 55-60%/  grd I DD. AV poorly visualized, possibly bicupid;  mild AS, valve area 1.78cm^2, mean grandient 31m Hg/  trivial TR.  03/21/21: EF 60-65%, mod aortic stenosis (mean 21, peak 35, area 1.14 cm2), aortic valve is tricuspid,, mild MR, nl LV syst fxn, grd I DD.  ? ? ?Outpatient Medications Prior to Visit  ?Medication Sig Dispense Refill  ? amphetamine-dextroamphetamine (ADDERALL XR) 30 MG 24 hr capsule Take 1 capsule (30 mg total) by mouth daily. 30 capsule 0  ? b complex vitamins capsule Take 1 capsule by mouth daily.    ? celecoxib (CELEBREX) 200 MG capsule Take 1 capsule (200 mg total) by mouth daily. 90 capsule 3  ? Magnesium 500 MG TABS Take 500 mg by mouth daily.    ? meclizine (ANTIVERT) 25 MG tablet TAKE 1 TABLET BY MOUTH THREE TIMES A DAY AS NEEDED FOR DIZZINESS 30 tablet 3  ? Melatonin 3 MG CAPS Take by mouth.    ? Multiple Vitamin (MULTIVITAMIN) tablet Take 1 tablet by mouth daily.    ? rOPINIRole (REQUIP) 1 MG tablet 5 tabs po qhs 450 tablet 3  ? rosuvastatin (CRESTOR) 10 MG tablet TAKE 1 TABLET BY MOUTH EVERY DAY 90 tablet 3  ? tadalafil (CIALIS) 5 MG tablet Take 5 mg by mouth daily.    ? tamsulosin (FLOMAX) 0.4 MG CAPS capsule Take 0.4 mg by mouth daily.    ? TART CHERRY PO Take by mouth  daily.    ? traZODone (DESYREL) 50 MG tablet TAKE 1 TO 2 TABLETS BY MOUTH EVERY DAY AT BEDTIME AS NEEDED FOR INSOMNIA 30 tablet 1  ? VITAMIN D PO Take 50,000 Units by mouth 2 (two) times daily.    ? clindamycin (CLEOCIN) 300 MG capsule Take 1 capsule (300 mg total) by mouth 3 (three) times daily. 3[place]  capsule 0  ? predniSONE (DELTASONE) 20 MG tablet 2 tabs po qd x 5d, then 1 tab po qd x 5d (Patient not taking: Reported on 06/06/2021) 15 tablet 0  ? promethazine-dextromethorphan (PROMETHAZINE-DM) 6.25-15 MG/5ML syrup 1-2 tsp q8h prn (Patient not taking: Reported on 06/06/2021) 118 mL 1  ? ?No facility-administered medications prior to visit.  ? ? ?Allergies  ?Allergen Reactions  ? Methylphenidate Palpitations  ?  Pt reported; confirmed he wanted med added to his allergy list  ? Oxycodone Itching and Other (See Comments)  ?  Nightmares, hallucinations  ? Penicillins Other (See Comments)  ?  Unknown childhood reaction  ? ? ?ROS ?As per HPI ? ?PE: ? ?  06/06/2021  ?  1:03 PM 04/04/2021  ?  4:09 PM 03/25/2021  ?  9:29 AM  ?Vitals with BMI  ?Height '6\' 3"'$  '6\' 3"'$    ?Weight 242 lbs 6 oz 240 lbs 235 lbs  ?BMI 30.3 30 29.37  ?Systolic  161 096  ?Diastolic  83 70  ?Pulse  89   ? ? ? ?Physical Exam ? ?General: Alert and well-appearing ?Left knee: No significant effusion noted.  No erythema or warmth.  He has full range of motion.  He has some pain in the anteromedial knee below the level of the patella with resisted extension and flexion.  Significant tenderness along medial aspect of knee, particularly over pes anserine bursa region. ?No lower leg edema. ? ?LABS:  ?Last CBC ?Lab Results  ?Component Value Date  ? WBC 4.6 09/03/2020  ? HGB 14.7 09/03/2020  ? HCT 45.1 09/03/2020  ? MCV 92.0 09/03/2020  ? MCH 30.0 09/03/2020  ? RDW 13.4 09/03/2020  ? PLT 102 (L) 09/03/2020  ? ?Last metabolic panel ?Lab Results  ?Component Value Date  ? GLUCOSE 96 01/27/2021  ? NA 141 01/27/2021  ? K 4.3 01/27/2021  ? CL 108 01/27/2021  ? CO2 26 01/27/2021   ? BUN 16 01/27/2021  ? CREATININE 0.82 01/27/2021  ? GFRNONAA >60 09/03/2020  ? CALCIUM 8.7 01/27/2021  ? PROT 5.8 (L) 01/27/2021  ? ALBUMIN 4.3 09/11/2020  ? LABGLOB 2.7 07/06/2014  ? AGRATIO 1.4 04/22

## 2021-06-06 NOTE — Patient Instructions (Signed)
Ice your knee for 20 minutes 2-3 times over the next 24 hours ?

## 2021-06-23 NOTE — Telephone Encounter (Signed)
Pt called said he spoke with ear,nose & throat specialist  ? ?Pt was informed that there is a 2 month wait for him to be seen by specialist. ? ?Pt wants another referral to another ENT specialist that can see him sooner. ?Pt said ENT has to accept his insurance health team advantage.  ? ?Pt cell: [phone] ?

## 2021-06-24 NOTE — Telephone Encounter (Signed)
Called pt left detailed VM that 2 mons was closer than I found reaching out to 2 other providers.  He could either find a location that can get him in sooner and call me back and let me know who he found or call back to [facility] and get an appt for the 2 months date they had.  ?

## 2021-06-26 NOTE — Telephone Encounter (Signed)
Pt called said about ENT ? ?Informed pt to call Sherri about location that his church remembers recommended . ? ?Informed pt of previous note, pt said if he can't find anything sooner. He will just settle with ENT that is available for June.  ? ? ? ? ? ? ?

## 2021-06-26 NOTE — Telephone Encounter (Signed)
Please review and advise.

## 2021-06-26 NOTE — Telephone Encounter (Signed)
Physical therapy referral ordered

## 2021-06-26 NOTE — Addendum Note (Signed)
Addended by: Tammi Sou on: 06/26/2021 01:49 PM ? ? Modules accepted: Orders ? ?

## 2021-06-26 NOTE — Telephone Encounter (Signed)
Spoke with pt regarding PT referral ?

## 2021-06-27 ENCOUNTER — Telehealth: Payer: Self-pay

## 2021-06-27 DIAGNOSIS — E878 Other disorders of electrolyte and fluid balance, not elsewhere classified: Secondary | ICD-10-CM

## 2021-06-27 DIAGNOSIS — R42 Dizziness and giddiness: Secondary | ICD-10-CM

## 2021-06-27 NOTE — Telephone Encounter (Signed)
Noted  

## 2021-06-27 NOTE — Telephone Encounter (Signed)
New referral ordered. Please let me know if any concerns or questions. Thank you ? ?[1:21 PM] Margrett Rud ?Heyy IT's me again.Marland Kitchen Happy Friday  ? ?[1:22 PM] Margrett Rud ?I have a referral for Andrew Stevenson but I can't pull the order to fax it  ? ? ? ?

## 2021-07-07 ENCOUNTER — Other Ambulatory Visit: Payer: Self-pay

## 2021-07-07 DIAGNOSIS — H8111 Benign paroxysmal vertigo, right ear: Secondary | ICD-10-CM | POA: Diagnosis not present

## 2021-07-07 DIAGNOSIS — E878 Other disorders of electrolyte and fluid balance, not elsewhere classified: Secondary | ICD-10-CM | POA: Diagnosis not present

## 2021-07-07 DIAGNOSIS — R42 Dizziness and giddiness: Secondary | ICD-10-CM | POA: Diagnosis not present

## 2021-07-07 NOTE — Telephone Encounter (Signed)
Patient refill request.  [facility] ? ?amphetamine-dextroamphetamine (ADDERALL XR) 30 MG 24 hr capsule [[id number]]  ?

## 2021-07-07 NOTE — Telephone Encounter (Signed)
Error in routing. Re-routing to PCP - Dr. Anitra Lauth ?

## 2021-07-08 MED ORDER — AMPHETAMINE-DEXTROAMPHET ER 30 MG PO CP24: ORAL_CAPSULE | ORAL | 0 refills | Status: DC

## 2021-07-08 NOTE — Telephone Encounter (Signed)
Pt advised regarding refill ?

## 2021-07-08 NOTE — Telephone Encounter (Signed)
Requesting: adderall ?Contract: 01/24/20 ?UDS: 09/11/20 ?Last Visit: 06/06/21 ?Next Visit: n/a ?Last Refill: 05/21/21(30,0) ? ?Please Advise. Med pending ?

## 2021-07-08 NOTE — Addendum Note (Signed)
Addended by: Deveron Furlong D on: 07/08/2021 09:10 AM ? ? Modules accepted: Orders ? ?

## 2021-07-16 DIAGNOSIS — R42 Dizziness and giddiness: Secondary | ICD-10-CM | POA: Diagnosis not present

## 2021-07-16 DIAGNOSIS — E878 Other disorders of electrolyte and fluid balance, not elsewhere classified: Secondary | ICD-10-CM | POA: Diagnosis not present

## 2021-07-16 DIAGNOSIS — H8111 Benign paroxysmal vertigo, right ear: Secondary | ICD-10-CM | POA: Diagnosis not present

## 2021-07-17 DIAGNOSIS — R42 Dizziness and giddiness: Secondary | ICD-10-CM | POA: Diagnosis not present

## 2021-07-17 DIAGNOSIS — E878 Other disorders of electrolyte and fluid balance, not elsewhere classified: Secondary | ICD-10-CM | POA: Diagnosis not present

## 2021-07-17 DIAGNOSIS — H8111 Benign paroxysmal vertigo, right ear: Secondary | ICD-10-CM | POA: Diagnosis not present

## 2021-07-22 DIAGNOSIS — E878 Other disorders of electrolyte and fluid balance, not elsewhere classified: Secondary | ICD-10-CM | POA: Diagnosis not present

## 2021-07-22 DIAGNOSIS — H8111 Benign paroxysmal vertigo, right ear: Secondary | ICD-10-CM | POA: Diagnosis not present

## 2021-07-22 DIAGNOSIS — R42 Dizziness and giddiness: Secondary | ICD-10-CM | POA: Diagnosis not present

## 2021-07-25 ENCOUNTER — Encounter: Payer: Self-pay | Admitting: Family Medicine

## 2021-07-25 ENCOUNTER — Ambulatory Visit (INDEPENDENT_AMBULATORY_CARE_PROVIDER_SITE_OTHER): Payer: PPO | Admitting: Family Medicine

## 2021-07-25 VITALS — BP 114/72 | HR 101 | Temp 98.5°F | Ht 75.0 in | Wt 238.0 lb

## 2021-07-25 DIAGNOSIS — I10 Essential (primary) hypertension: Secondary | ICD-10-CM

## 2021-07-25 MED ORDER — VALSARTAN 80 MG PO TABS: 80.0000 mg | ORAL_TABLET | Freq: Every day | ORAL | 0 refills | Status: DC

## 2021-07-25 NOTE — Progress Notes (Signed)
? ? ? ? ?[place] , 1945/05/30, 76 y.o., male ?MRN: [id number] ?Patient Care Team  ?  Relationship Specialty Notifications Start End  ?Tammi Sou, MD PCP - General Family Medicine  02/26/16   ?Adrian Prows, MD Consulting Physician Cardiology  02/26/16   ?Tyler Pita, MD Consulting Physician Radiation Oncology  02/26/16   ?Chesley Mires, MD Consulting Physician Pulmonary Disease  07/09/16   ?Gatha Mayer, MD Consulting Physician Gastroenterology  05/31/17   ?Zonia Kief, MD Consulting Physician Rehabilitation  06/28/19   ? Comment: [facility] specialists  ?Starling Manns, MD Consulting Physician Orthopedic Surgery  06/28/19   ?Eustace Moore, MD Consulting Physician Neurosurgery  04/19/20   ?Ardis Hughs, MD Consulting Physician Urology  11/14/20   ?Jolene Provost, PA-C Physician Assistant Otolaryngology  06/17/21   ? ? ?Chief Complaint  ?Patient presents with  ? Hypertension  ?  Pt report BP 220/114; pt took wife valsartan  ? ?  ?Subjective: Pt presents for an OV with complaints of elevated blood pressure- reported 220/114 at the fire department this morning.  He states they were wanting to call an ambulance but he did not want to go.  He denies any symptoms at the time such as chest pain, shortness of breath headache or dizziness. ?Reports he has been battling vertigo and has been attending physical therapy.  His physical therapy had to be canceled secondary to elevated blood pressure of 184/90.  He checked it at home and he continued to get elevated blood pressure readings.  He went to the fire department and had a blood pressure reading of 190/92 and then it progressively started increasing each time with a checked his blood pressure thereafter.  He reports he went home and this morning he noticed he had an elevated blood pressure again so he took his wife's valsartan 80 mg.  He reports when he checked it this afternoon before coming in his blood pressure was normal. ?He denies any  changes to his diet or exercise routine.  He is prescribed Adderall, but reports he has not had the Adderall in his system of the last couple of days secondary to it being out of stock. ? ? ?  06/06/2021  ?  1:05 PM 01/27/2021  ?  1:08 PM 09/11/2020  ? 10:12 AM 05/13/2018  ?  8:43 AM 02/26/2017  ?  9:55 AM  ?Depression screen PHQ 2/9  ?Decreased Interest 0 0 0 1 2  ?Down, Depressed, Hopeless 0 0 0 0 0  ?PHQ - 2 Score 0 0 0 1 2  ?Altered sleeping    1 2  ?Tired, decreased energy    1 2  ?Change in appetite    0 0  ?Feeling bad or failure about yourself     0 0  ?Trouble concentrating    0 1  ?Moving slowly or fidgety/restless    0 0  ?Suicidal thoughts    0   ?PHQ-9 Score    3 7  ?Difficult doing work/chores    Not difficult at all Not difficult at all  ? ? ?Allergies  ?Allergen Reactions  ? Methylphenidate Palpitations  ?  Pt reported; confirmed he wanted med added to his allergy list  ? Oxycodone Itching and Other (See Comments)  ?  Nightmares, hallucinations  ? Penicillins Other (See Comments)  ?  Unknown childhood reaction  ? ?Social History  ? ?Social History Narrative  ? Married, 1 daughter.  ? Educ: HS  ?  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  ? No tob.  ? Occ alcohol.  ? ?Past Medical History:  ?Diagnosis Date  ? Adult ADHD   ? adderall caused elevated bp  ? Anxiety and depression   ? in past/ due to back injuries  ? At risk for sleep apnea   ? STOP-BANG= 5      SENT TO PCP 11-05-2014  ? Benign paroxysmal positional vertigo   ? Branch retinal artery occlusion of right eye   ? COVID-19 virus infection 09/2020  ? Depression   ? Disequilibrium syndrome   ? Carotid dopplers and MRI brain NORMAL x 2 (including eighth nerve studies on most recent MRI 08/2016).  ? Erectile dysfunction   ? Failed back surgical syndrome   ? Dr. Sherley Bounds 04/16/20->ref to pain mgmt-> SPINAL CORD STIMULATOR helpful (2022)  ? Gross hematuria   ? passed a stone prior to the planned CT 10/2020  ? Heart murmur   ?  History of concussion   ? june 2015--  hit head w/ syncope---  no residual  ? History of memory loss   ? per prior PCP records./ per pt due to medication  ? History of syncope   ? june 2015 --syncope w/ fall and pt states has happened 5 more times , the last one being jan 2016,,  states had battery of test and unknown cause  ? Hx of adenomatous colonic polyps 05/25/2017  ? Recall 05/2022  ? Hyperlipidemia 02/2016  ? atorva 02/2016---lipid #s worse so changed to generic crestor.  Much improved on crestor.  ? IFG (impaired fasting glucose) 02/2016  ? HbA1c 5 %.  ? Insomnia   ? Moderate aortic stenosis   ? Plan repeat echo 03/2022  ? Myalgia and myositis, unspecified   ? Nephrolithiasis   ? passed stone 10/2020  ? OAB (overactive bladder)   ? Osteoarthritis, multiple sites   ? C-spine, L spine, knees, "all my joints basically"  ? Prostate cancer [facility]) urologist-  dr ottelin/  oncologist-  dr Valere Dross  ? 2016 Stage T1c,  Gleason 4+3,  PSA 5.26, vol 49.19cc.  No biochemical recurrence as of 11/2020.  ? RLS (restless legs syndrome)   ? Spinal stenosis, lumbar region with neurogenic claudication   ? s/p L3-S1 fusion revision 09/2018. Pain worse 05/2019->MRI showed ankylosis + surg fusion, L2-3 being the only mobile level of lower spine. SPINAL CORD STIMULATOR->helpful.  ? Thrombocytopenia (HCC)   ? >100K.  Mild, chronic  ? Upper airway cough syndrome 2018  ? Dr. Halford Chessman.  Cough therapy maximized at f/u 07/2016; referred to ENT by Pulm.  Allergy testing OK. Improved as of 08/05/16 pulm f/u.  ? Vertebrobasilar insufficiency   ? per neurologist note dr Felecia Shelling-- this is possible causing syncope-- no tx just be careful getting up from sitting position  ? Wears glasses   ? White coat syndrome without hypertension   ? ?Past Surgical History:  ?Procedure Laterality Date  ? Carotid Dopplers  08/01/2014  ? NORMAL (Dr. Felecia Shelling)  ? CERVICAL FUSION  last one 2010  ? 2 times  ? COLONOSCOPY  02/2004; 05/2017  ? 2019: adenomatous polyp, int hem,  diverticulosis, radiation proctitis.  Recall 05/2022.  ? EYE SURGERY Bilateral 2013  ? 5 right eye, 3 left eye surgeries (including cataract extraction's iol w/ lens implant, other surgery's for post-op lens fungal infection and floaters)  ? GOLD SEED IMPLANT N/A 02/22/2015  ? Procedure: GOLD SEED IMPLANT;  Surgeon: Kathie Rhodes, MD;  Location: [facility];  Service: Urology;  Laterality: N/A;  ? LEFT THIGH QUADRICEP MUSCLE BIOPSY'S  12/31/2005  ? LUMBAR FUSION  x2  last one 2010  ? 4 surgeries in back per pt  ? MRA/MRI w/out contrast--brain  12/2014  ? mild, age-related chronic microvascular ischemic change (Dr. Felecia Shelling).  ? RADIOACTIVE SEED IMPLANT N/A 05/17/2015  ? Procedure: RADIOACTIVE SEED IMPLANT/BRACHYTHERAPY IMPLANT;  Surgeon: Kathie Rhodes, MD;  Location: [facility];  Service: Urology;  Laterality: N/A;  ? SPINAL CORD STIMULATOR IMPLANT    ? Spinal cord stimulator implantation  06/2020  ? HELPS!  ? TRANSRECTAL ULTRASOUND N/A 11/12/2014  ? Procedure: TRANSRECTAL ULTRASOUND AND BIOPSY  ;  Surgeon: Kathie Rhodes, MD;  Location: [facility];  Service: Urology;  Laterality: N/A;  ? TRANSTHORACIC ECHOCARDIOGRAM  03/29/2014  ? mild concentric LVH/  ef 55-60%/  grd I DD. AV poorly visualized, possibly bicupid;  mild AS, valve area 1.78cm^2, mean grandient 15m Hg/  trivial TR.  03/21/21: EF 60-65%, mod aortic stenosis (mean 21, peak 35, area 1.14 cm2), aortic valve is tricuspid,, mild MR, nl LV syst fxn, grd I DD.  ? ?Family History  ?Problem Relation Age of Onset  ? Hypertension Mother   ? Alcohol abuse Father   ? Arthritis Father   ? Lung cancer Father   ?     smoker  ? Diabetes Brother   ? Alcohol abuse Maternal Grandfather   ? Stroke Maternal Grandfather   ? Cancer Paternal Grandmother   ? Alcohol abuse Paternal Grandfather   ? Lung cancer Brother   ?     smoker  ? ?Allergies as of 07/25/2021   ? ?   Reactions  ? Methylphenidate Palpitations  ? Pt reported; confirmed he  wanted med added to his allergy list  ? Oxycodone Itching, Other (See Comments)  ? Nightmares, hallucinations  ? Penicillins Other (See Comments)  ? Unknown childhood reaction  ? ?  ? ?  ?Medication List  ?  ? ?  ? A

## 2021-07-29 ENCOUNTER — Ambulatory Visit: Payer: 59 | Admitting: Family Medicine

## 2021-08-14 DIAGNOSIS — H903 Sensorineural hearing loss, bilateral: Secondary | ICD-10-CM | POA: Diagnosis not present

## 2021-08-14 DIAGNOSIS — R42 Dizziness and giddiness: Secondary | ICD-10-CM | POA: Diagnosis not present

## 2021-08-16 ENCOUNTER — Other Ambulatory Visit: Payer: Self-pay | Admitting: Family Medicine

## 2021-08-20 ENCOUNTER — Ambulatory Visit: Payer: PPO | Admitting: Family Medicine

## 2021-08-20 NOTE — Progress Notes (Deleted)
OFFICE VISIT  08/20/2021  CC: No chief complaint on file.   Patient is a 76 y.o. male who presents for right knee pain.  HPI: ***  I ordered knee x-rays 02/24/2021 but he does not get these done yet.  Right knee injection by me 02/24/2021. Left knee injected by me 06/06/21.  Of note, diagnosis of hypertension.  Had been off meds for quite some time.  BPs rose again and patient restarted med--> his wife's valsartan 80 mg daily. Dr. Raoul Pitch recently saw him in the office 08/05/2021 and continued him on this medication.   Past Medical History:  Diagnosis Date   Adult ADHD    adderall caused elevated bp   Anxiety and depression    in past/ due to back injuries   At risk for sleep apnea    STOP-BANG= 5      SENT TO PCP 11-05-2014   Benign paroxysmal positional vertigo    Branch retinal artery occlusion of right eye    COVID-19 virus infection 09/2020   Depression    Disequilibrium syndrome    Carotid dopplers and MRI brain NORMAL x 2 (including eighth nerve studies on most recent MRI 08/2016).   Erectile dysfunction    Failed back surgical syndrome    Dr. Sherley Bounds 04/16/20->ref to pain mgmt-> SPINAL CORD STIMULATOR helpful (2022)   Gross hematuria    passed a stone prior to the planned CT 10/2020   Heart murmur    History of concussion    june 2015--  hit head w/ syncope---  no residual   History of memory loss    per prior PCP records./ per pt due to medication   History of syncope    june 2015 --syncope w/ fall and pt states has happened 5 more times , the last one being jan 2016,,  states had battery of test and unknown cause   Hx of adenomatous colonic polyps 05/25/2017   Recall 05/2022   Hyperlipidemia 02/2016   atorva 02/2016---lipid #s worse so changed to generic crestor.  Much improved on crestor.   IFG (impaired fasting glucose) 02/2016   HbA1c 5 %.   Insomnia    Moderate aortic stenosis    Plan repeat echo 03/2022   Myalgia and myositis, unspecified     Nephrolithiasis    passed stone 10/2020   OAB (overactive bladder)    Osteoarthritis, multiple sites    C-spine, L spine, knees, "all my joints basically"   Prostate cancer [facility]) urologist-  dr ottelin/  oncologist-  dr Valere Dross   2016 Stage T1c,  Gleason 4+3,  PSA 5.26, vol 49.19cc.  No biochemical recurrence as of 11/2020.   RLS (restless legs syndrome)    Spinal stenosis, lumbar region with neurogenic claudication    s/p L3-S1 fusion revision 09/2018. Pain worse 05/2019->MRI showed ankylosis + surg fusion, L2-3 being the only mobile level of lower spine. SPINAL CORD STIMULATOR->helpful.   Thrombocytopenia (HCC)    >100K.  Mild, chronic   Upper airway cough syndrome 2018   Dr. Halford Chessman.  Cough therapy maximized at f/u 07/2016; referred to ENT by Pulm.  Allergy testing OK. Improved as of 08/05/16 pulm f/u.   Vertebrobasilar insufficiency    per neurologist note dr Felecia Shelling-- this is possible causing syncope-- no tx just be careful getting up from sitting position   Wears glasses    White coat syndrome without hypertension     Past Surgical History:  Procedure Laterality Date   Carotid Dopplers  08/01/2014   NORMAL (Dr. Felecia Shelling)   [place]  last one 2010   2 times   COLONOSCOPY  02/2004; 05/2017   2019: adenomatous polyp, int hem, diverticulosis, radiation proctitis.  Recall 05/2022.   EYE SURGERY Bilateral 2013   5 right eye, 3 left eye surgeries (including cataract extraction's iol w/ lens implant, other surgery's for post-op lens fungal infection and floaters)   GOLD SEED IMPLANT N/A 02/22/2015   Procedure: GOLD SEED IMPLANT;  Surgeon: Kathie Rhodes, MD;  Location: [facility];  Service: Urology;  Laterality: N/A;   LEFT THIGH QUADRICEP MUSCLE BIOPSY'S  12/31/2005   LUMBAR FUSION  x2  last one 2010   4 surgeries in back per pt   MRA/MRI w/out contrast--brain  12/2014   mild, age-related chronic microvascular ischemic change (Dr. Felecia Shelling).   RADIOACTIVE SEED IMPLANT N/A  05/17/2015   Procedure: RADIOACTIVE SEED IMPLANT/BRACHYTHERAPY IMPLANT;  Surgeon: Kathie Rhodes, MD;  Location: [facility];  Service: Urology;  Laterality: N/A;   SPINAL CORD STIMULATOR IMPLANT     Spinal cord stimulator implantation  06/2020   HELPS!   TRANSRECTAL ULTRASOUND N/A 11/12/2014   Procedure: TRANSRECTAL ULTRASOUND AND BIOPSY  ;  Surgeon: Kathie Rhodes, MD;  Location: [facility];  Service: Urology;  Laterality: N/A;   TRANSTHORACIC ECHOCARDIOGRAM  03/29/2014   mild concentric LVH/  ef 55-60%/  grd I DD. AV poorly visualized, possibly bicupid;  mild AS, valve area 1.78cm^2, mean grandient 28m Hg/  trivial TR.  03/21/21: EF 60-65%, mod aortic stenosis (mean 21, peak 35, area 1.14 cm2), aortic valve is tricuspid,, mild MR, nl LV syst fxn, grd I DD.    Outpatient Medications Prior to Visit  Medication Sig Dispense Refill   amphetamine-dextroamphetamine (ADDERALL XR) 30 MG 24 hr capsule 2 tabs po qAM 60 capsule 0   b complex vitamins capsule Take 1 capsule by mouth daily.     celecoxib (CELEBREX) 200 MG capsule Take 1 capsule (200 mg total) by mouth daily. 90 capsule 3   Magnesium 500 MG TABS Take 500 mg by mouth daily.     meclizine (ANTIVERT) 25 MG tablet TAKE 1 TABLET BY MOUTH THREE TIMES A DAY AS NEEDED FOR DIZZINESS (Patient not taking: Reported on 07/25/2021) 30 tablet 3   Melatonin 3 MG CAPS Take by mouth.     Multiple Vitamin (MULTIVITAMIN) tablet Take 1 tablet by mouth daily.     rOPINIRole (REQUIP) 1 MG tablet 5 tabs po qhs 450 tablet 3   rosuvastatin (CRESTOR) 10 MG tablet TAKE 1 TABLET BY MOUTH EVERY DAY 90 tablet 3   tadalafil (CIALIS) 5 MG tablet Take 5 mg by mouth daily.     tamsulosin (FLOMAX) 0.4 MG CAPS capsule Take 0.4 mg by mouth daily. (Patient not taking: Reported on 07/25/2021)     TART CHERRY PO Take by mouth daily.     traZODone (DESYREL) 50 MG tablet TAKE 1 TO 2 TABLETS BY MOUTH EVERY DAY AT BEDTIME AS NEEDED FOR INSOMNIA (Patient  not taking: Reported on 07/25/2021) 30 tablet 1   valsartan (DIOVAN) 80 MG tablet TAKE 1 TABLET BY MOUTH EVERY DAY 30 tablet 0   VITAMIN D PO Take 50,000 Units by mouth 2 (two) times daily.     No facility-administered medications prior to visit.    Allergies  Allergen Reactions   Methylphenidate Palpitations    Pt reported; confirmed he wanted med added to his allergy list   Oxycodone Itching  and Other (See Comments)    Nightmares, hallucinations   Penicillins Other (See Comments)    Unknown childhood reaction    ROS As per HPI  PE:    07/25/2021    1:22 PM 06/06/2021    1:03 PM 04/04/2021    4:09 PM  Vitals with BMI  Height '6\' 3"'$  '6\' 3"'$  '6\' 3"'$   Weight 238 lbs 242 lbs 6 oz 240 lbs  BMI 17.71 16.5 30  Systolic 790  383  Diastolic 72  83  Pulse 338  89   Physical Exam  ***  LABS:  Last CBC Lab Results  Component Value Date   WBC 4.6 09/03/2020   HGB 14.7 09/03/2020   HCT 45.1 09/03/2020   MCV 92.0 09/03/2020   MCH 30.0 09/03/2020   RDW 13.4 09/03/2020   PLT 102 (L) 32/91/9166   Last metabolic panel Lab Results  Component Value Date   GLUCOSE 96 01/27/2021   NA 141 01/27/2021   K 4.3 01/27/2021   CL 108 01/27/2021   CO2 26 01/27/2021   BUN 16 01/27/2021   CREATININE 0.82 01/27/2021   GFRNONAA >60 09/03/2020   CALCIUM 8.7 01/27/2021   PROT 5.8 (L) 01/27/2021   ALBUMIN 4.3 09/11/2020   LABGLOB 2.7 07/06/2014   AGRATIO 1.4 07/06/2014   BILITOT 0.8 01/27/2021   ALKPHOS 68 09/11/2020   AST 22 01/27/2021   ALT 21 01/27/2021   ANIONGAP 6 09/03/2020   IMPRESSION AND PLAN:  No problem-specific Assessment & Plan notes found for this encounter.   An After Visit Summary was printed and given to the patient.  FOLLOW UP: No follow-ups on file.  Signed:  Crissie Sickles, MD           08/20/2021

## 2021-08-21 ENCOUNTER — Encounter: Payer: Self-pay | Admitting: Family Medicine

## 2021-08-21 ENCOUNTER — Ambulatory Visit (INDEPENDENT_AMBULATORY_CARE_PROVIDER_SITE_OTHER): Payer: PPO | Admitting: Family Medicine

## 2021-08-21 VITALS — BP 111/62 | HR 86 | Temp 97.9°F | Ht 75.0 in | Wt 237.6 lb

## 2021-08-21 DIAGNOSIS — M170 Bilateral primary osteoarthritis of knee: Secondary | ICD-10-CM

## 2021-08-21 DIAGNOSIS — F988 Other specified behavioral and emotional disorders with onset usually occurring in childhood and adolescence: Secondary | ICD-10-CM | POA: Diagnosis not present

## 2021-08-21 MED ORDER — AMPHETAMINE-DEXTROAMPHET ER 30 MG PO CP24: ORAL_CAPSULE | ORAL | 0 refills | Status: DC

## 2021-08-21 MED ORDER — ROPINIROLE HCL 1 MG PO TABS: ORAL_TABLET | ORAL | 1 refills | Status: DC

## 2021-08-21 MED ORDER — TRIAMCINOLONE ACETONIDE 80 MG/ML IJ SUSP: Freq: Once | INTRAMUSCULAR | Status: AC

## 2021-08-21 MED ORDER — TRAZODONE HCL 50 MG PO TABS: ORAL_TABLET | ORAL | 1 refills | Status: DC

## 2021-08-21 MED ORDER — VALSARTAN 80 MG PO TABS: 80.0000 mg | ORAL_TABLET | Freq: Every day | ORAL | 1 refills | Status: DC

## 2021-08-21 NOTE — Progress Notes (Signed)
OFFICE VISIT  09/30/2021  CC:  Chief Complaint  Patient presents with   Knee Pain    Right;    Patient is a 76 y.o. male who presents for right knee pain as well as follow-up adult ADD and hypertension.  HPI: History of chronic right knee pain, recent significant increase in pain and swelling that started about 2 days ago after he bent down, flexed his knees.  Says the swelling is gone down a little bit since that time. Additionally, he has a history of recurrent left knee pain and swelling.  I ordered plain films of his left knee in December 2022 and again in March this year and he has not had a chance to get this done yet. Right knee injection by me 02/24/2021. I injected left knee suprapatellar and pes anserinus 3 months ago and this was helpful.  He requests steroid injections in both knees today.  Adult ADD: Pt states all is going well with the med at current dosing, Adderall XR 30 mg, 2 tabs daily: much improved focus, concentration, task completion.  Less frustration, better multitasking, less impulsivity and restlessness.  Mood is stable. No side effects from the medication.  No home blood pressure monitoring.  He takes valsartan and 80 mg daily.  Review of systems: No fever, chills, or malaise.  No rashes. No chest pain, shortness of breath, palpitations, dizziness, or lower extremity swelling   Past Medical History:  Diagnosis Date   Adult ADHD    adderall caused elevated bp   Anxiety and depression    in past/ due to back injuries   At risk for sleep apnea    STOP-BANG= 5      SENT TO PCP 11-05-2014   Benign paroxysmal positional vertigo    Branch retinal artery occlusion of right eye    COVID-19 virus infection 09/2020   Depression    Disequilibrium syndrome    Carotid dopplers and MRI brain NORMAL x 2 (including eighth nerve studies on most recent MRI 08/2016).   Erectile dysfunction    Failed back surgical syndrome    Dr. Sherley Bounds 04/16/20->ref to pain  mgmt-> SPINAL CORD STIMULATOR helpful (2022)   Gross hematuria    passed a stone prior to the planned CT 10/2020   Heart murmur    History of concussion    june 2015--  hit head w/ syncope---  no residual   History of memory loss    per prior PCP records./ per pt due to medication   History of syncope    june 2015 --syncope w/ fall and pt states has happened 5 more times , the last one being jan 2016,,  states had battery of test and unknown cause   Hx of adenomatous colonic polyps 05/25/2017   Recall 05/2022   Hyperlipidemia 02/2016   atorva 02/2016---lipid #s worse so changed to generic crestor.  Much improved on crestor.   IFG (impaired fasting glucose) 02/2016   HbA1c 5 %.   Insomnia    Moderate aortic stenosis    Plan repeat echo 03/2022   Myalgia and myositis, unspecified    Nephrolithiasis    passed stone 10/2020   OAB (overactive bladder)    Osteoarthritis, multiple sites    C-spine, L spine, knees, "all my joints basically"   Prostate cancer [facility]) urologist-  dr ottelin/  oncologist-  dr Valere Dross   2016 Stage T1c,  Gleason 4+3,  PSA 5.26, vol 49.19cc.  No biochemical recurrence as of 11/2020.  RLS (restless legs syndrome)    Spinal stenosis, lumbar region with neurogenic claudication    s/p L3-S1 fusion revision 09/2018. Pain worse 05/2019->MRI showed ankylosis + surg fusion, L2-3 being the only mobile level of lower spine. SPINAL CORD STIMULATOR->helpful.   Thrombocytopenia (HCC)    >100K.  Mild, chronic   Upper airway cough syndrome 2018   Dr. Halford Chessman.  Cough therapy maximized at f/u 07/2016; referred to ENT by Pulm.  Allergy testing OK. Improved as of 08/05/16 pulm f/u.   Vertebrobasilar insufficiency    per neurologist note dr Felecia Shelling-- this is possible causing syncope-- no tx just be careful getting up from sitting position   Wears glasses    White coat syndrome without hypertension     Past Surgical History:  Procedure Laterality Date   Carotid Dopplers  08/01/2014   NORMAL  (Dr. Felecia Shelling)   [place]  last one 2010   2 times   COLONOSCOPY  02/2004; 05/2017   2019: adenomatous polyp, int hem, diverticulosis, radiation proctitis.  Recall 05/2022.   EYE SURGERY Bilateral 2013   5 right eye, 3 left eye surgeries (including cataract extraction's iol w/ lens implant, other surgery's for post-op lens fungal infection and floaters)   GOLD SEED IMPLANT N/A 02/22/2015   Procedure: GOLD SEED IMPLANT;  Surgeon: Kathie Rhodes, MD;  Location: [facility];  Service: Urology;  Laterality: N/A;   LEFT THIGH QUADRICEP MUSCLE BIOPSY'S  12/31/2005   LUMBAR FUSION  x2  last one 2010   4 surgeries in back per pt   MRA/MRI w/out contrast--brain  12/2014   mild, age-related chronic microvascular ischemic change (Dr. Felecia Shelling).   RADIOACTIVE SEED IMPLANT N/A 05/17/2015   Procedure: RADIOACTIVE SEED IMPLANT/BRACHYTHERAPY IMPLANT;  Surgeon: Kathie Rhodes, MD;  Location: [place];  Service: Urology;  Laterality: N/A;   SPINAL CORD STIMULATOR IMPLANT     Spinal cord stimulator implantation  06/2020   HELPS!   TRANSRECTAL ULTRASOUND N/A 11/12/2014   Procedure: TRANSRECTAL ULTRASOUND AND BIOPSY  ;  Surgeon: Kathie Rhodes, MD;  Location: [facility];  Service: Urology;  Laterality: N/A;   TRANSTHORACIC ECHOCARDIOGRAM  03/29/2014   mild concentric LVH/  ef 55-60%/  grd I DD. AV poorly visualized, possibly bicupid;  mild AS, valve area 1.78cm^2, mean grandient 61m Hg/  trivial TR.  03/21/21: EF 60-65%, mod aortic stenosis (mean 21, peak 35, area 1.14 cm2), aortic valve is tricuspid,, mild MR, nl LV syst fxn, grd I DD.    Outpatient Medications Prior to Visit  Medication Sig Dispense Refill   b complex vitamins capsule Take 1 capsule by mouth daily.     celecoxib (CELEBREX) 200 MG capsule Take 1 capsule (200 mg total) by mouth daily. 90 capsule 3   gabapentin (NEURONTIN) 600 MG tablet Take 600 mg by mouth 2 (two) times daily.     Magnesium 500 MG  TABS Take 500 mg by mouth daily.     meclizine (ANTIVERT) 25 MG tablet TAKE 1 TABLET BY MOUTH THREE TIMES A DAY AS NEEDED FOR DIZZINESS 30 tablet 3   Melatonin 3 MG CAPS Take by mouth.     Multiple Vitamin (MULTIVITAMIN) tablet Take 1 tablet by mouth daily.     rosuvastatin (CRESTOR) 10 MG tablet TAKE 1 TABLET BY MOUTH EVERY DAY 90 tablet 3   tadalafil (CIALIS) 5 MG tablet Take 5 mg by mouth daily.     TART CHERRY PO Take by mouth daily.  VITAMIN D PO Take 50,000 Units by mouth 2 (two) times daily.     tamsulosin (FLOMAX) 0.4 MG CAPS capsule Take 0.4 mg by mouth daily. (Patient not taking: Reported on 07/25/2021)     amphetamine-dextroamphetamine (ADDERALL XR) 30 MG 24 hr capsule 2 tabs po qAM 60 capsule 0   rOPINIRole (REQUIP) 1 MG tablet 5 tabs po qhs 450 tablet 3   traZODone (DESYREL) 50 MG tablet TAKE 1 TO 2 TABLETS BY MOUTH EVERY DAY AT BEDTIME AS NEEDED FOR INSOMNIA (Patient not taking: Reported on 07/25/2021) 30 tablet 1   valsartan (DIOVAN) 80 MG tablet TAKE 1 TABLET BY MOUTH EVERY DAY 30 tablet 0   No facility-administered medications prior to visit.    Allergies  Allergen Reactions   Methylphenidate Palpitations    Pt reported; confirmed he wanted med added to his allergy list   Oxycodone Itching and Other (See Comments)    Nightmares, hallucinations   Penicillins Other (See Comments)    Unknown childhood reaction    ROS As per HPI  PE:    08/21/2021    3:29 PM 07/25/2021    1:22 PM 06/06/2021    1:03 PM  Vitals with BMI  Height '6\' 3"'$  '6\' 3"'$  '6\' 3"'$   Weight 237 lbs 10 oz 238 lbs 242 lbs 6 oz  BMI 29.7 41.32 44.0  Systolic 102 725   Diastolic 62 72   Pulse 86 101    Physical Exam  Gen: Alert, well appearing.  Patient is oriented to person, place, time, and situation. AFFECT: pleasant, lucid thought and speech. Right knee with small effusion, no erythema or warmth.  Mild limitation of right knee flexion--gets to a little over 90 degrees. Extension is fully intact.   No instability.  No joint line or other tenderness to palpation. Left knee: No effusion, warmth, or erythema.  No tenderness or instability. Range of motion fully intact.  LABS:  Last metabolic panel Lab Results  Component Value Date   GLUCOSE 96 01/27/2021   NA 141 01/27/2021   K 4.3 01/27/2021   CL 108 01/27/2021   CO2 26 01/27/2021   BUN 16 01/27/2021   CREATININE 0.82 01/27/2021   GFRNONAA >60 09/03/2020   CALCIUM 8.7 01/27/2021   PROT 5.8 (L) 01/27/2021   ALBUMIN 4.3 09/11/2020   LABGLOB 2.7 07/06/2014   AGRATIO 1.4 07/06/2014   BILITOT 0.8 01/27/2021   ALKPHOS 68 09/11/2020   AST 22 01/27/2021   ALT 21 01/27/2021   ANIONGAP 6 09/03/2020   IMPRESSION AND PLAN:  #1 bilateral knee pain, chronic with acute exacerbation on the left greater than right. Suspect osteoarthritis but unable to verify this radiographically due patient not realizing that I ordered x-rays. I ordered left and right knee x-rays again today. Patient desires steroid injections in each knee today. Discussed potential for future Ortho referral and he declined at this time.  Ultrasound-guided injection is preferred based on studies that show increased duration, increased effect, greater accuracy, decreased procedural pain, increased response rate, and decreased cost with ultrasound-guided versus blind injection. Procedure: Real-time ultrasound guided injection of bilateral suprapatellar recess. Device: [employer] informed consent obtained.  Timeout conducted.  No overlying erythema, induration, or other signs of local infection. After sterile prep with Betadine, injected 3 cc of 1% lidocaine without epinephrine followed by a mixture of 40 mg  mg Kenalog and 3 cc of 1% plain lidocaine using 25-gauge 1/2 inch needle, passing the needle from lateral approach into suprapatellar  pouch.  Injectate seen filling suprapatellar pouch. Patient tolerated the procedure well.  No immediate complications.   Post-injection care discussed. Advised to call if fever/chills, erythema, drainage, or persistent bleeding.  Impression: Technically successful ultrasound-guided injection of both knees--suprapatellar pouch.  #2 adult ADD, stable on Adderall XR 30 mg, 2 tabs daily. Refilled today.  3.  Hypertension, well controlled on valsartan 80 mg daily. Refilled today.  An After Visit Summary was printed and given to the patient.  FOLLOW UP: Return in about 6 months (around 02/20/2022) for routine chronic illness f/u.  Signed:  Crissie Sickles, MD           09/30/2021

## 2021-09-03 NOTE — Progress Notes (Deleted)
Virtual Visit via Telephone Note  I connected with  Andrew Stevenson on 09/03/21 at  3:30 PM EDT by telephone and verified that I am speaking with the correct person using two identifiers.  Medicare Annual Wellness visit completed telephonically due to Covid-19 pandemic.   Persons participating in this call: This Health Coach and this patient.   Location: Patient: home  Provider: office   I discussed the limitations, risks, security and privacy concerns of performing an evaluation and management service by telephone and the availability of in person appointments. The patient expressed understanding and agreed to proceed.  Unable to perform video visit due to video visit attempted and failed and/or patient does not have video capability.   Some vital signs may be absent or patient reported.   Willette Brace, LPN   Subjective:   Andrew Stevenson is a 76 y.o. male who presents for an Initial Medicare Annual Wellness Visit.  Review of Systems           Objective:    There were no vitals filed for this visit. There is no height or weight on file to calculate BMI.     09/03/2020    1:29 PM 05/04/2018    6:44 PM 06/13/2015   10:30 AM 05/17/2015    8:50 AM 02/22/2015   11:51 AM 11/12/2014    6:07 AM 03/28/2014    2:58 PM  Advanced Directives  Does Patient Have a Medical Advance Directive? No No No No No No No  Would patient like information on creating a medical advance directive?   No - patient declined information No - patient declined information No - patient declined information Yes - Educational materials given No - patient declined information    Current Medications (verified) Outpatient Encounter Medications as of 09/03/2021  Medication Sig   amphetamine-dextroamphetamine (ADDERALL XR) 30 MG 24 hr capsule 2 tabs po qAM   b complex vitamins capsule Take 1 capsule by mouth daily.   celecoxib (CELEBREX) 200 MG capsule Take 1 capsule (200 mg total) by mouth daily.   gabapentin  (NEURONTIN) 600 MG tablet Take 600 mg by mouth 2 (two) times daily.   Magnesium 500 MG TABS Take 500 mg by mouth daily.   meclizine (ANTIVERT) 25 MG tablet TAKE 1 TABLET BY MOUTH THREE TIMES A DAY AS NEEDED FOR DIZZINESS   Melatonin 3 MG CAPS Take by mouth.   Multiple Vitamin (MULTIVITAMIN) tablet Take 1 tablet by mouth daily.   rOPINIRole (REQUIP) 1 MG tablet TAKE 5 TABS PO QHS   rosuvastatin (CRESTOR) 10 MG tablet TAKE 1 TABLET BY MOUTH EVERY DAY   tadalafil (CIALIS) 5 MG tablet Take 5 mg by mouth daily.   tamsulosin (FLOMAX) 0.4 MG CAPS capsule Take 0.4 mg by mouth daily. (Patient not taking: Reported on 07/25/2021)   TART CHERRY PO Take by mouth daily.   traZODone (DESYREL) 50 MG tablet TAKE 1 TO 2 TABLETS BY MOUTH EVERY DAY AT BEDTIME AS NEEDED FOR INSOMNIA   valsartan (DIOVAN) 80 MG tablet Take 1 tablet (80 mg total) by mouth daily.   VITAMIN D PO Take 50,000 Units by mouth 2 (two) times daily.   No facility-administered encounter medications on file as of 09/03/2021.    Allergies (verified) Methylphenidate, Oxycodone, and Penicillins   History: Past Medical History:  Diagnosis Date   Adult ADHD    adderall caused elevated bp   Anxiety and depression    in past/ due to back injuries  At risk for sleep apnea    STOP-BANG= 5      SENT TO PCP 11-05-2014   Benign paroxysmal positional vertigo    Branch retinal artery occlusion of right eye    COVID-19 virus infection 09/2020   Depression    Disequilibrium syndrome    Carotid dopplers and MRI brain NORMAL x 2 (including eighth nerve studies on most recent MRI 08/2016).   Erectile dysfunction    Failed back surgical syndrome    Dr. Sherley Bounds 04/16/20->ref to pain mgmt-> SPINAL CORD STIMULATOR helpful (2022)   Gross hematuria    passed a stone prior to the planned CT 10/2020   Heart murmur    History of concussion    june 2015--  hit head w/ syncope---  no residual   History of memory loss    per prior PCP records./ per pt due  to medication   History of syncope    june 2015 --syncope w/ fall and pt states has happened 5 more times , the last one being jan 2016,,  states had battery of test and unknown cause   Hx of adenomatous colonic polyps 05/25/2017   Recall 05/2022   Hyperlipidemia 02/2016   atorva 02/2016---lipid #s worse so changed to generic crestor.  Much improved on crestor.   IFG (impaired fasting glucose) 02/2016   HbA1c 5 %.   Insomnia    Moderate aortic stenosis    Plan repeat echo 03/2022   Myalgia and myositis, unspecified    Nephrolithiasis    passed stone 10/2020   OAB (overactive bladder)    Osteoarthritis, multiple sites    C-spine, L spine, knees, "all my joints basically"   Prostate cancer [facility]) urologist-  dr ottelin/  oncologist-  dr Valere Dross   2016 Stage T1c,  Gleason 4+3,  PSA 5.26, vol 49.19cc.  No biochemical recurrence as of 11/2020.   RLS (restless legs syndrome)    Spinal stenosis, lumbar region with neurogenic claudication    s/p L3-S1 fusion revision 09/2018. Pain worse 05/2019->MRI showed ankylosis + surg fusion, L2-3 being the only mobile level of lower spine. SPINAL CORD STIMULATOR->helpful.   Thrombocytopenia (HCC)    >100K.  Mild, chronic   Upper airway cough syndrome 2018   Dr. Halford Chessman.  Cough therapy maximized at f/u 07/2016; referred to ENT by Pulm.  Allergy testing OK. Improved as of 08/05/16 pulm f/u.   Vertebrobasilar insufficiency    per neurologist note dr Felecia Shelling-- this is possible causing syncope-- no tx just be careful getting up from sitting position   Wears glasses    White coat syndrome without hypertension    Past Surgical History:  Procedure Laterality Date   Carotid Dopplers  08/01/2014   NORMAL (Dr. Felecia Shelling)   [place]  last one 2010   2 times   COLONOSCOPY  02/2004; 05/2017   2019: adenomatous polyp, int hem, diverticulosis, radiation proctitis.  Recall 05/2022.   EYE SURGERY Bilateral 2013   5 right eye, 3 left eye surgeries (including cataract  extraction's iol w/ lens implant, other surgery's for post-op lens fungal infection and floaters)   GOLD SEED IMPLANT N/A 02/22/2015   Procedure: GOLD SEED IMPLANT;  Surgeon: Kathie Rhodes, MD;  Location: [facility];  Service: Urology;  Laterality: N/A;   LEFT THIGH QUADRICEP MUSCLE BIOPSY'S  12/31/2005   LUMBAR FUSION  x2  last one 2010   4 surgeries in back per pt   MRA/MRI w/out contrast--brain  12/2014  mild, age-related chronic microvascular ischemic change (Dr. Felecia Shelling).   RADIOACTIVE SEED IMPLANT N/A 05/17/2015   Procedure: RADIOACTIVE SEED IMPLANT/BRACHYTHERAPY IMPLANT;  Surgeon: Kathie Rhodes, MD;  Location: [place];  Service: Urology;  Laterality: N/A;   SPINAL CORD STIMULATOR IMPLANT     Spinal cord stimulator implantation  06/2020   HELPS!   TRANSRECTAL ULTRASOUND N/A 11/12/2014   Procedure: TRANSRECTAL ULTRASOUND AND BIOPSY  ;  Surgeon: Kathie Rhodes, MD;  Location: [facility];  Service: Urology;  Laterality: N/A;   TRANSTHORACIC ECHOCARDIOGRAM  03/29/2014   mild concentric LVH/  ef 55-60%/  grd I DD. AV poorly visualized, possibly bicupid;  mild AS, valve area 1.78cm^2, mean grandient 27m Hg/  trivial TR.  03/21/21: EF 60-65%, mod aortic stenosis (mean 21, peak 35, area 1.14 cm2), aortic valve is tricuspid,, mild MR, nl LV syst fxn, grd I DD.   Family History  Problem Relation Age of Onset   Hypertension Mother    Alcohol abuse Father    Arthritis Father    Lung cancer Father        smoker   Diabetes Brother    Alcohol abuse Maternal Grandfather    Stroke Maternal Grandfather    Cancer Paternal Grandmother    Alcohol abuse Paternal Grandfather    Lung cancer Brother        smoker   Social History   Socioeconomic History   Marital status: Married    Spouse name: Not on file   Number of children: Not on file   Years of education: Not on file   Highest education level: Not on file  Occupational History   Occupation:  UNEMPLOYED  Tobacco Use   Smoking status: Some Days    Types: Cigarettes, Cigars   Smokeless tobacco: Former    Types: Snuff    Quit date: 04/12/2018   Tobacco comments:    using snuff x 25 years  Vaping Use   Vaping Use: Never used  Substance and Sexual Activity   Alcohol use: Yes    Alcohol/week: 0.0 standard drinks of alcohol    Comment: rare   Drug use: No   Sexual activity: Not on file  Other Topics Concern   Not on file  Social History Narrative   Married, 1 daughter.   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   No tob.   Occ alcohol.   Social Determinants of Health   Financial Resource Strain: Not on file  Food Insecurity: Not on file  Transportation Needs: Not on file  Physical Activity: Not on file  Stress: Not on file  Social Connections: Not on file    Tobacco Counseling Ready to quit: Not Answered Counseling given: Not Answered Tobacco comments: using snuff x 25 years   Clinical Intake:                 Diabetic?no         Activities of Daily Living    09/11/2020   10:12 AM  In your present state of health, do you have any difficulty performing the following activities:  Hearing? 0  Vision? 0  Difficulty concentrating or making decisions? 0  Walking or climbing stairs? 0  Dressing or bathing? 0  Doing errands, shopping? 0    Patient Care Team: MTammi Sou MD as PCP - General (Family Medicine) GAdrian Prows MD as Consulting Physician (Cardiology) MTyler Pita MD as Consulting Physician (Radiation Oncology) SVentura  Elisabeth Cara, MD as Consulting Physician (Pulmonary Disease) Gatha Mayer, MD as Consulting Physician (Gastroenterology) Zonia Kief, MD as Consulting Physician (Rehabilitation) Starling Manns, MD as Consulting Physician (Orthopedic Surgery) Eustace Moore, MD as Consulting Physician (Neurosurgery) Ardis Hughs, MD as Consulting Physician (Urology) Jolene Provost, PA-C as Physician Assistant (Otolaryngology)  Indicate any recent Medical Services you may have received from other than [facility] providers in the past year (date may be approximate).     Assessment:   This is a routine wellness examination for Bruno.  Hearing/Vision screen No results found.  Dietary issues and exercise activities discussed:     Goals Addressed   None    Depression Screen    06/06/2021    1:05 PM 01/27/2021    1:08 PM 09/11/2020   10:12 AM 05/13/2018    8:43 AM 02/26/2017    9:55 AM 06/13/2015   10:30 AM 03/22/2015    3:46 PM  PHQ 2/9 Scores  PHQ - 2 Score 0 0 0 1 2 0 0  PHQ- 9 Score    3 7      Fall Risk    06/06/2021    1:04 PM 01/27/2021    1:08 PM 09/11/2020   10:12 AM 07/13/2019    8:06 AM 05/13/2018    8:42 AM  Fall Risk   Falls in the past year? 0 0 0 0 0  Number falls in past yr: 0 0 0 0 0  Injury with Fall? 0 0 0 0 0  Follow up Falls evaluation completed Falls evaluation completed Falls evaluation completed Falls evaluation completed     FALL RISK PREVENTION PERTAINING TO THE HOME:  Any stairs in or around the home? {YES/NO:21197} If so, are there any without handrails? {YES/NO:21197} Home free of loose throw rugs in walkways, pet beds, electrical cords, etc? {YES/NO:21197} Adequate lighting in your home to reduce risk of falls? {YES/NO:21197}  ASSISTIVE DEVICES UTILIZED TO PREVENT FALLS:  Life alert? {YES/NO:21197} Use of a cane, walker or w/c? {YES/NO:21197} Grab bars in the bathroom? {YES/NO:21197} Shower chair or bench in shower? {YES/NO:21197} Elevated toilet seat or a handicapped toilet? {YES/NO:21197}  TIMED UP AND GO:  Was the test performed? {YES/NO:21197}.  Length of time to ambulate 10 feet: *** sec.   {Appearance of QIHK:7425956}  Cognitive Function:        Immunizations Immunization History  Administered Date(s) Administered   Fluad Quad(high Dose 65+) 12/19/2018, 01/24/2020   Influenza, High Dose Seasonal  PF 06/02/2018   PFIZER(Purple Top)SARS-COV-2 Vaccination 04/30/2019, 05/23/2019, 03/28/2020   Pneumococcal Conjugate-13 02/26/2017   Pneumococcal Polysaccharide-23 06/30/2011   Tdap 12/21/2020   Zoster Recombinat (Shingrix) 06/02/2018, 09/11/2020   Zoster, Live 06/30/2011    {TDAP status:2101805}  {Flu Vaccine status:2101806}  {Pneumococcal vaccine status:2101807}  {Covid-19 vaccine status:2101808}  Qualifies for Shingles Vaccine? {YES/NO:21197}  Zostavax completed {YES/NO:21197}  {Shingrix Completed?:2101804}  Screening Tests Health Maintenance  Topic Date Due   COVID-19 Vaccine (4 - Booster for Pfizer series) 09/06/2021 (Originally 05/23/2020)   INFLUENZA VACCINE  10/14/2021   COLONOSCOPY (Pts 45-88yr Insurance coverage will need to be confirmed)  05/26/2022   TETANUS/TDAP  12/22/2030   Pneumonia Vaccine 76 Years old  Completed   Hepatitis C Screening  Completed   Zoster Vaccines- Shingrix  Completed   HPV VACCINES  Aged Out    Health Maintenance  There are no preventive care reminders to display for this patient.  {Colorectal cancer screening:2101809}  Lung Cancer Screening: (Low Dose CT Chest  recommended if Age 12-80 years, 60 pack-year currently smoking OR have quit w/in 15years.) {DOES NOT does:27190::"does not"} qualify.   Lung Cancer Screening Referral: ***  Additional Screening:  Hepatitis C Screening: {DOES NOT does:27190::"does not"} qualify; Completed ***  Vision Screening: Recommended annual ophthalmology exams for early detection of glaucoma and other disorders of the eye. Is the patient up to date with their annual eye exam?  {YES/NO:21197} Who is the provider or what is the name of the office in which the patient attends annual eye exams? *** If pt is not established with a provider, would they like to be referred to a provider to establish care? {YES/NO:21197}.   Dental Screening: Recommended annual dental exams for proper oral  hygiene  Community Resource Referral / Chronic Care Management: CRR required this visit?  {YES/NO:21197}  CCM required this visit?  {YES/NO:21197}     Plan:     I have personally reviewed and noted the following in the patient's chart:   Medical and social history Use of alcohol, tobacco or illicit drugs  Current medications and supplements including opioid prescriptions. {Opioid Prescriptions:[phone]} Functional ability and status Nutritional status Physical activity Advanced directives List of other physicians Hospitalizations, surgeries, and ER visits in previous 12 months Vitals Screenings to include cognitive, depression, and falls Referrals and appointments  In addition, I have reviewed and discussed with patient certain preventive protocols, quality metrics, and best practice recommendations. A written personalized care plan for preventive services as well as general preventive health recommendations were provided to patient.     Willette Brace, LPN   1/41/0301   Nurse Notes: ***

## 2021-09-03 NOTE — Progress Notes (Signed)
This encounter was created in error - please disregard. No answer , Left voice message

## 2021-09-24 ENCOUNTER — Ambulatory Visit: Payer: PPO

## 2021-10-18 ENCOUNTER — Telehealth: Payer: Self-pay

## 2021-10-18 NOTE — Telephone Encounter (Signed)
Left message for patient to call office to schedule medicare well visit.

## 2021-10-30 DIAGNOSIS — F411 Generalized anxiety disorder: Secondary | ICD-10-CM | POA: Diagnosis not present

## 2021-10-31 ENCOUNTER — Other Ambulatory Visit: Payer: Self-pay

## 2021-10-31 NOTE — Telephone Encounter (Signed)
Patient refill request.  [facility] - Patient verified with pharmacy, they do have med in stock.  amphetamine-dextroamphetamine (ADDERALL XR) 30 MG 24 hr capsule [[id number]]   valsartan (DIOVAN) 80 MG tablet [[id number]]

## 2021-10-31 NOTE — Telephone Encounter (Signed)
Requesting: adderall XR Contract: 01/24/20 UDS: 09/11/20 Last Visit: 08/21/21 Next Visit: 02/20/22 Last Refill: 08/21/21 (60,0)  Please Advise. Med pending. Patient's valsartan was last refilled 08/21/21 (90,1)

## 2021-11-03 MED ORDER — AMPHETAMINE-DEXTROAMPHET ER 30 MG PO CP24: ORAL_CAPSULE | ORAL | 0 refills | Status: DC

## 2021-11-03 NOTE — Telephone Encounter (Signed)
Pt advised refills sent. °

## 2021-11-06 DIAGNOSIS — M542 Cervicalgia: Secondary | ICD-10-CM | POA: Diagnosis not present

## 2021-11-06 DIAGNOSIS — Z6829 Body mass index (BMI) 29.0-29.9, adult: Secondary | ICD-10-CM | POA: Diagnosis not present

## 2021-11-07 ENCOUNTER — Other Ambulatory Visit (HOSPITAL_COMMUNITY): Payer: Self-pay | Admitting: Neurological Surgery

## 2021-11-07 ENCOUNTER — Encounter (HOSPITAL_COMMUNITY): Payer: Self-pay

## 2021-11-07 ENCOUNTER — Other Ambulatory Visit: Payer: Self-pay | Admitting: Neurological Surgery

## 2021-11-07 DIAGNOSIS — M542 Cervicalgia: Secondary | ICD-10-CM

## 2021-11-18 ENCOUNTER — Telehealth: Payer: Self-pay

## 2021-11-18 NOTE — Telephone Encounter (Signed)
LVM for pt to CB and schedule Medicare Annual Wellness Visit (AWV).   Please schedule before 12/13/21

## 2021-12-02 ENCOUNTER — Other Ambulatory Visit: Payer: Self-pay | Admitting: Family Medicine

## 2021-12-04 DIAGNOSIS — F411 Generalized anxiety disorder: Secondary | ICD-10-CM | POA: Diagnosis not present

## 2021-12-08 ENCOUNTER — Encounter (HOSPITAL_COMMUNITY): Payer: Self-pay

## 2021-12-08 ENCOUNTER — Ambulatory Visit (HOSPITAL_COMMUNITY): Admission: RE | Admit: 2021-12-08 | Payer: PPO | Source: Ambulatory Visit

## 2021-12-11 ENCOUNTER — Telehealth: Payer: Self-pay | Admitting: Family Medicine

## 2021-12-11 MED ORDER — AMPHETAMINE-DEXTROAMPHET ER 30 MG PO CP24: ORAL_CAPSULE | ORAL | 0 refills | Status: DC

## 2021-12-11 NOTE — Telephone Encounter (Signed)
Okay, Adderall prescription sent

## 2021-12-11 NOTE — Telephone Encounter (Signed)
Requesting: adderall XR Contract: 01/24/20 UDS: 09/11/20 Last Visit: 08/21/21 Next Visit: 02/20/22 Last Refill: 08/21/21 (60,0)

## 2021-12-11 NOTE — Telephone Encounter (Signed)
Pt is calling asking for Adderall refill. He reports that he will be traveling to [place] soon. Please let patient know when medication is sent to pharmacy.

## 2022-01-22 DIAGNOSIS — F411 Generalized anxiety disorder: Secondary | ICD-10-CM | POA: Diagnosis not present

## 2022-02-20 ENCOUNTER — Encounter: Payer: Self-pay | Admitting: Family Medicine

## 2022-02-20 ENCOUNTER — Ambulatory Visit (INDEPENDENT_AMBULATORY_CARE_PROVIDER_SITE_OTHER): Payer: PPO | Admitting: Family Medicine

## 2022-02-20 VITALS — BP 130/82 | HR 71 | Temp 98.2°F | Ht 75.0 in | Wt 241.6 lb

## 2022-02-20 DIAGNOSIS — E7800 Pure hypercholesterolemia, unspecified: Secondary | ICD-10-CM | POA: Diagnosis not present

## 2022-02-20 DIAGNOSIS — M542 Cervicalgia: Secondary | ICD-10-CM | POA: Diagnosis not present

## 2022-02-20 DIAGNOSIS — G8929 Other chronic pain: Secondary | ICD-10-CM | POA: Diagnosis not present

## 2022-02-20 DIAGNOSIS — I10 Essential (primary) hypertension: Secondary | ICD-10-CM | POA: Diagnosis not present

## 2022-02-20 DIAGNOSIS — Z79899 Other long term (current) drug therapy: Secondary | ICD-10-CM

## 2022-02-20 DIAGNOSIS — G2581 Restless legs syndrome: Secondary | ICD-10-CM | POA: Diagnosis not present

## 2022-02-20 DIAGNOSIS — F988 Other specified behavioral and emotional disorders with onset usually occurring in childhood and adolescence: Secondary | ICD-10-CM | POA: Diagnosis not present

## 2022-02-20 DIAGNOSIS — D696 Thrombocytopenia, unspecified: Secondary | ICD-10-CM | POA: Diagnosis not present

## 2022-02-20 LAB — LIPID PANEL
Cholesterol: 132 mg/dL (ref 0–200)
HDL: 59.8 mg/dL (ref >39.00)
LDL Cholesterol: 54 mg/dL (ref 0–99)
NonHDL: 71.9
Total CHOL/HDL Ratio: 2
Triglycerides: 89 mg/dL (ref 0.0–149.0)
VLDL: 17.8 mg/dL (ref 0.0–40.0)

## 2022-02-20 LAB — COMPREHENSIVE METABOLIC PANEL
ALT: 22 U/L (ref 0–53)
AST: 24 U/L (ref 0–37)
Albumin: 4.3 g/dL (ref 3.5–5.2)
Alkaline Phosphatase: 59 U/L (ref 39–117)
BUN: 15 mg/dL (ref 6–23)
CO2: 34 mEq/L — ABNORMAL HIGH (ref 19–32)
Calcium: 9.4 mg/dL (ref 8.4–10.5)
Chloride: 105 mEq/L (ref 96–112)
Creatinine, Ser: 0.97 mg/dL (ref 0.40–1.50)
GFR: 75.11 mL/min (ref >60.00)
Glucose, Bld: 116 mg/dL — ABNORMAL HIGH (ref 70–99)
Potassium: 4.9 mEq/L (ref 3.5–5.1)
Sodium: 141 mEq/L (ref 135–145)
Total Bilirubin: 0.9 mg/dL (ref 0.2–1.2)
Total Protein: 6.4 g/dL (ref 6.0–8.3)

## 2022-02-20 LAB — CBC
HCT: 45.4 % (ref 39.0–52.0)
Hemoglobin: 14.9 g/dL (ref 13.0–17.0)
MCHC: 32.9 g/dL (ref 30.0–36.0)
MCV: 92.3 fl (ref 78.0–100.0)
Platelets: 99 10*3/uL — ABNORMAL LOW (ref 150.0–400.0)
RBC: 4.92 Mil/uL (ref 4.22–5.81)
RDW: 13.8 % (ref 11.5–15.5)
WBC: 3.8 10*3/uL — ABNORMAL LOW (ref 4.0–10.5)

## 2022-02-20 MED ORDER — AMPHETAMINE-DEXTROAMPHET ER 30 MG PO CP24: ORAL_CAPSULE | ORAL | 0 refills | Status: DC

## 2022-02-20 MED ORDER — ROPINIROLE HCL 1 MG PO TABS: ORAL_TABLET | ORAL | 1 refills | Status: DC

## 2022-02-20 NOTE — Progress Notes (Signed)
OFFICE VISIT  02/20/2022  CC:  Chief Complaint  Patient presents with   Hypertension   ADD    Patient is a 76 y.o. male who presents for f/u HTN, RLS, and adult ADD,  INTERIM HX:  Current medications working well for him.  Pt states all is going well with the med at current dosing (adderall xr '30mg'$ , 2 qAM): much improved focus, concentration, task completion.  Less frustration, better multitasking, less impulsivity and restlessness.  Mood is stable. No side effects from the medication.  He tells me today that he started seeing a psychiatrist a couple months ago. Andrew Stevenson states he was started on sertraline for PTSD (hx abuse) bout a month ago. He does not feel any improvement at this time.  He has follow-up with his psychiatrist soon.  Additionally, he has chronic neck pain and decreased range of motion.  Sounds like this has been going on for years and getting worse. He has extensive history of arthritis in his low back with multiple surgeries. His neck pain extends a little bit into the tops of the trapezius regions but not over the shoulders and down the arms.  No paresthesias.  No arm weakness.  No gait instability.  PMP AWARE reviewed today: most recent rx for Adderall XR 30 mg was filled 12/11/2021, # 18, rx by me. No red flags.   Past Medical History:  Diagnosis Date   Adult ADHD    adderall caused elevated bp   Anxiety and depression    in past/ due to back injuries   At risk for sleep apnea    STOP-BANG= 5      SENT TO PCP 11-05-2014   Benign paroxysmal positional vertigo    Branch retinal artery occlusion of right eye    COVID-19 virus infection 09/2020   Depression    Disequilibrium syndrome    Carotid dopplers and MRI brain NORMAL x 2 (including eighth nerve studies on most recent MRI 08/2016).   Erectile dysfunction    Failed back surgical syndrome    Dr. Sherley Bounds 04/16/20->ref to pain mgmt-> SPINAL CORD STIMULATOR helpful (2022)   Gross hematuria    passed a  stone prior to the planned CT 10/2020   Heart murmur    History of concussion    june 2015--  hit head w/ syncope---  no residual   History of memory loss    per prior PCP records./ per pt due to medication   History of syncope    june 2015 --syncope w/ fall and pt states has happened 5 more times , the last one being jan 2016,,  states had battery of test and unknown cause   Hx of adenomatous colonic polyps 05/25/2017   Recall 05/2022   Hyperlipidemia 02/2016   atorva 02/2016---lipid #s worse so changed to generic crestor.  Much improved on crestor.   IFG (impaired fasting glucose) 02/2016   HbA1c 5 %.   Insomnia    Moderate aortic stenosis    Plan repeat echo 03/2022   Myalgia and myositis, unspecified    Nephrolithiasis    passed stone 10/2020   OAB (overactive bladder)    Osteoarthritis, multiple sites    C-spine, L spine, knees, "all my joints basically"   Prostate cancer [facility]) urologist-  dr ottelin/  oncologist-  dr Valere Dross   2016 Stage T1c,  Gleason 4+3,  PSA 5.26, vol 49.19cc.  No biochemical recurrence as of 11/2020.   RLS (restless legs syndrome)  Spinal stenosis, lumbar region with neurogenic claudication    s/p L3-S1 fusion revision 09/2018. Pain worse 05/2019->MRI showed ankylosis + surg fusion, L2-3 being the only mobile level of lower spine. SPINAL CORD STIMULATOR->helpful.   Thrombocytopenia (HCC)    >100K.  Mild, chronic   Upper airway cough syndrome 2018   Dr. Halford Chessman.  Cough therapy maximized at f/u 07/2016; referred to ENT by Pulm.  Allergy testing OK. Improved as of 08/05/16 pulm f/u.   Vertebrobasilar insufficiency    per neurologist note dr Felecia Shelling-- this is possible causing syncope-- no tx just be careful getting up from sitting position   Wears glasses    White coat syndrome without hypertension     Past Surgical History:  Procedure Laterality Date   Carotid Dopplers  08/01/2014   NORMAL (Dr. Felecia Shelling)   [place]  last one 2010   2 times   COLONOSCOPY   02/2004; 05/2017   2019: adenomatous polyp, int hem, diverticulosis, radiation proctitis.  Recall 05/2022.   EYE SURGERY Bilateral 2013   5 right eye, 3 left eye surgeries (including cataract extraction's iol w/ lens implant, other surgery's for post-op lens fungal infection and floaters)   GOLD SEED IMPLANT N/A 02/22/2015   Procedure: GOLD SEED IMPLANT;  Surgeon: Kathie Rhodes, MD;  Location: [facility];  Service: Urology;  Laterality: N/A;   LEFT THIGH QUADRICEP MUSCLE BIOPSY'S  12/31/2005   LUMBAR FUSION  x2  last one 2010   4 surgeries in back per pt   MRA/MRI w/out contrast--brain  12/2014   mild, age-related chronic microvascular ischemic change (Dr. Felecia Shelling).   RADIOACTIVE SEED IMPLANT N/A 05/17/2015   Procedure: RADIOACTIVE SEED IMPLANT/BRACHYTHERAPY IMPLANT;  Surgeon: Kathie Rhodes, MD;  Location: [place];  Service: Urology;  Laterality: N/A;   SPINAL CORD STIMULATOR IMPLANT     Spinal cord stimulator implantation  06/2020   HELPS!   TRANSRECTAL ULTRASOUND N/A 11/12/2014   Procedure: TRANSRECTAL ULTRASOUND AND BIOPSY  ;  Surgeon: Kathie Rhodes, MD;  Location: [facility];  Service: Urology;  Laterality: N/A;   TRANSTHORACIC ECHOCARDIOGRAM  03/29/2014   mild concentric LVH/  ef 55-60%/  grd I DD. AV poorly visualized, possibly bicupid;  mild AS, valve area 1.78cm^2, mean grandient 34m Hg/  trivial TR.  03/21/21: EF 60-65%, mod aortic stenosis (mean 21, peak 35, area 1.14 cm2), aortic valve is tricuspid,, mild MR, nl LV syst fxn, grd I DD.    Outpatient Medications Prior to Visit  Medication Sig Dispense Refill   b complex vitamins capsule Take 1 capsule by mouth daily.     celecoxib (CELEBREX) 200 MG capsule Take 1 capsule (200 mg total) by mouth daily. 90 capsule 3   gabapentin (NEURONTIN) 600 MG tablet Take 600 mg by mouth 2 (two) times daily.     Magnesium 500 MG TABS Take 500 mg by mouth daily.     meclizine (ANTIVERT) 25 MG tablet  TAKE 1 TABLET BY MOUTH THREE TIMES A DAY AS NEEDED FOR DIZZINESS 30 tablet 3   Melatonin 3 MG CAPS Take by mouth.     Multiple Vitamin (MULTIVITAMIN) tablet Take 1 tablet by mouth daily.     rosuvastatin (CRESTOR) 10 MG tablet TAKE 1 TABLET BY MOUTH EVERY DAY 90 tablet 3   sertraline (ZOLOFT) 50 MG tablet Take 50 mg by mouth.     tadalafil (CIALIS) 5 MG tablet Take 5 mg by mouth daily.     tamsulosin (FLOMAX) 0.4  MG CAPS capsule Take 0.4 mg by mouth daily.     TART CHERRY PO Take by mouth daily.     traZODone (DESYREL) 50 MG tablet TAKE 1 TO 2 TABLETS BY MOUTH EVERY DAY AT BEDTIME AS NEEDED FOR INSOMNIA 180 tablet 0   valsartan (DIOVAN) 80 MG tablet Take 1 tablet (80 mg total) by mouth daily. 90 tablet 1   VITAMIN D PO Take 50,000 Units by mouth 2 (two) times daily.     amphetamine-dextroamphetamine (ADDERALL XR) 30 MG 24 hr capsule 2 tabs po qAM 60 capsule 0   rOPINIRole (REQUIP) 1 MG tablet TAKE 5 TABS PO QHS 450 tablet 1   No facility-administered medications prior to visit.    Allergies  Allergen Reactions   Methylphenidate Palpitations    Pt reported; confirmed he wanted med added to his allergy list   Other     Narcotics;   Nightmares, hallucinations   Oxycodone Itching and Other (See Comments)    Nightmares, hallucinations   Penicillins Other (See Comments)    Unknown childhood reaction    ROS As per HPI  PE:    02/20/2022    1:50 PM 02/20/2022    1:15 PM 08/21/2021    3:29 PM  Vitals with BMI  Height  '6\' 3"'$  '6\' 3"'$   Weight  241 lbs 10 oz 237 lbs 10 oz  BMI  26.3 78.5  Systolic 885 027 741  Diastolic 82 71 62  Pulse  71 86     Physical Exam  Gen: Alert, well appearing.  Patient is oriented to person, place, time, and situation. AFFECT: pleasant, lucid thought and speech. No further exam today  LABS:  Last CBC Lab Results  Component Value Date   WBC 4.6 09/03/2020   HGB 14.7 09/03/2020   HCT 45.1 09/03/2020   MCV 92.0 09/03/2020   MCH 30.0 09/03/2020    RDW 13.4 09/03/2020   PLT 102 (L) 28/78/6767   Last metabolic panel Lab Results  Component Value Date   GLUCOSE 96 01/27/2021   NA 141 01/27/2021   K 4.3 01/27/2021   CL 108 01/27/2021   CO2 26 01/27/2021   BUN 16 01/27/2021   CREATININE 0.82 01/27/2021   GFRNONAA >60 09/03/2020   CALCIUM 8.7 01/27/2021   PROT 5.8 (L) 01/27/2021   ALBUMIN 4.3 09/11/2020   LABGLOB 2.7 07/06/2014   AGRATIO 1.4 07/06/2014   BILITOT 0.8 01/27/2021   ALKPHOS 68 09/11/2020   AST 22 01/27/2021   ALT 21 01/27/2021   ANIONGAP 6 09/03/2020   Last lipids Lab Results  Component Value Date   CHOL 114 09/11/2020   HDL 55.20 09/11/2020   LDLCALC 47 09/11/2020   LDLCALC CANCELED 09/11/2020   LDLDIRECT 98.0 08/20/2016   TRIG 56.0 09/11/2020   TRIG CANCELED 09/11/2020   CHOLHDL 2 09/11/2020   Last hemoglobin A1c Lab Results  Component Value Date   HGBA1C 5.0 02/27/2016   Last thyroid functions Lab Results  Component Value Date   TSH 2.00 02/27/2016   Last vitamin B12 and Folate Lab Results  Component Value Date   VITAMINB12 382 07/06/2014   Lab Results  Component Value Date   PSA 0.019 11/20/2020   PSA 0.021 11/22/2019   PSA 0.03 (L) 12/19/2018   IMPRESSION AND PLAN:  #1 cervicalgia. Suspect cervical spondylosis. Referral to PT today. C-spine radiographs ordered.  2.  Hypertension, well-controlled on valsartan 80 mg a day. Electrolytes and creatinine today.  3.  Adult ADD. Stable  long-term on Adderall XR 30 mg tabs, 2 every morning. He does not use this every day. Controlled substance contract updated.  4.  Hyperlipidemia, doing well on 10 mg rosuvastatin daily. Lipid panel today (not fasting).  #5 restless leg syndrome.  Doing well long-term on 5 mg of ropinirole nightly.  #6 preventative health care:  Vaccines all up-to-date. Prostate cancer: PSAs followed by urology. Colon cancer screening: Recall March 2024.  An After Visit Summary was printed and given to the  patient.  FOLLOW UP: Return in about 6 months (around 08/22/2022) for annual CPE (fasting).  Signed:  Crissie Sickles, MD           02/20/2022

## 2022-02-28 ENCOUNTER — Other Ambulatory Visit: Payer: Self-pay | Admitting: Family Medicine

## 2022-03-05 DIAGNOSIS — F411 Generalized anxiety disorder: Secondary | ICD-10-CM | POA: Diagnosis not present

## 2022-04-08 ENCOUNTER — Encounter: Payer: Self-pay | Admitting: Family Medicine

## 2022-04-08 ENCOUNTER — Telehealth (INDEPENDENT_AMBULATORY_CARE_PROVIDER_SITE_OTHER): Payer: PPO | Admitting: Family Medicine

## 2022-04-08 VITALS — Wt 230.0 lb

## 2022-04-08 DIAGNOSIS — H532 Diplopia: Secondary | ICD-10-CM | POA: Diagnosis not present

## 2022-04-08 DIAGNOSIS — G450 Vertebro-basilar artery syndrome: Secondary | ICD-10-CM | POA: Diagnosis not present

## 2022-04-08 DIAGNOSIS — H814 Vertigo of central origin: Secondary | ICD-10-CM

## 2022-04-08 NOTE — Progress Notes (Signed)
Virtual Visit via Video Note  I connected with Andrew Stevenson  on 04/08/22 at  4:00 PM EST by a video enabled telemedicine application and verified that I am speaking with the correct person using two identifiers.  Location patient: [place] Location provider:work or home office Persons participating in the virtual visit: patient, provider  I discussed the limitations and requested verbal permission for telemedicine visit. The patient expressed understanding and agreed to proceed.  CC:  77 year old male being seen today for vertigo.  HPI:  Worsening vertigo for the last 3 months.  This has been a problem that has waxed and waned for years.  Sometimes positional but other times not.  Significant nausea with episodes.  Episodes sometimes wax and wane throughout his entire day, sometimes leading to enough ataxia that he almost falls.  Today a particularly bad day.  Occasionally gets significant vision abnormalities, including double vision but not for very long.  He has had some significant issues with his lenses that have brought on various vision difficulties that are hard to tell whether or not are associated with his vertigo episodes or not. No headaches, no loss of bowel or bladder control, no cognitive impairment. In July 2023 he was evaluated by ENT.  Says he had several tests but cannot recall which particular ones.  No imaging was done. He states all tests were normal. He states they took him off of meclizine.  He says th this medication had been particularly helpful though. He had also been in vestibular PT in the recent past and says that he felt like it was beginning to help as well. No pulsatile or other type of tinnitus.      ROS: See pertinent positives and negatives per HPI.  Past Medical History:  Diagnosis Date   Adult ADHD    adderall caused elevated bp   Anxiety and depression    in past/ due to back injuries   At risk for sleep apnea    STOP-BANG= 5      SENT TO PCP 11-05-2014    Benign paroxysmal positional vertigo    Branch retinal artery occlusion of right eye    COVID-19 virus infection 09/2020   Depression    Disequilibrium syndrome    Carotid dopplers and MRI brain NORMAL x 2 (including eighth nerve studies on most recent MRI 08/2016).   Erectile dysfunction    Failed back surgical syndrome    Dr. Sherley Bounds 04/16/20->ref to pain mgmt-> SPINAL CORD STIMULATOR helpful (2022)   Gross hematuria    passed a stone prior to the planned CT 10/2020   Heart murmur    History of concussion    june 2015--  hit head w/ syncope---  no residual   History of memory loss    per prior PCP records./ per pt due to medication   History of syncope    june 2015 --syncope w/ fall and pt states has happened 5 more times , the last one being jan 2016,,  states had battery of test and unknown cause   Hx of adenomatous colonic polyps 05/25/2017   Recall 05/2022   Hyperlipidemia 02/2016   atorva 02/2016---lipid #s worse so changed to generic crestor.  Much improved on crestor.   IFG (impaired fasting glucose) 02/2016   HbA1c 5 %.   Insomnia    Moderate aortic stenosis    Plan repeat echo 03/2022   Myalgia and myositis, unspecified    Nephrolithiasis    passed stone 10/2020  OAB (overactive bladder)    Osteoarthritis, multiple sites    C-spine, L spine, knees, "all my joints basically"   Prostate cancer [facility]) urologist-  dr ottelin/  oncologist-  dr Valere Dross   2016 Stage T1c,  Gleason 4+3,  PSA 5.26, vol 49.19cc.  No biochemical recurrence as of 11/2020.   RLS (restless legs syndrome)    Spinal stenosis, lumbar region with neurogenic claudication    s/p L3-S1 fusion revision 09/2018. Pain worse 05/2019->MRI showed ankylosis + surg fusion, L2-3 being the only mobile level of lower spine. SPINAL CORD STIMULATOR->helpful.   Thrombocytopenia (HCC)    >100K.  Mild, chronic   Upper airway cough syndrome 2018   Dr. Halford Chessman.  Cough therapy maximized at f/u 07/2016; referred to ENT by Pulm.   Allergy testing OK. Improved as of 08/05/16 pulm f/u.   Vertebrobasilar insufficiency    per neurologist note dr Felecia Shelling-- this is possible causing syncope-- no tx just be careful getting up from sitting position   Wears glasses    White coat syndrome without hypertension     Past Surgical History:  Procedure Laterality Date   Carotid Dopplers  08/01/2014   NORMAL (Dr. Felecia Shelling)   [place]  last one 2010   2 times   COLONOSCOPY  02/2004; 05/2017   2019: adenomatous polyp, int hem, diverticulosis, radiation proctitis.  Recall 05/2022.   EYE SURGERY Bilateral 2013   5 right eye, 3 left eye surgeries (including cataract extraction's iol w/ lens implant, other surgery's for post-op lens fungal infection and floaters)   GOLD SEED IMPLANT N/A 02/22/2015   Procedure: GOLD SEED IMPLANT;  Surgeon: Kathie Rhodes, MD;  Location: [facility];  Service: Urology;  Laterality: N/A;   LEFT THIGH QUADRICEP MUSCLE BIOPSY'S  12/31/2005   LUMBAR FUSION  x2  last one 2010   4 surgeries in back per pt   MRA/MRI w/out contrast--brain  12/2014   mild, age-related chronic microvascular ischemic change (Dr. Felecia Shelling).   RADIOACTIVE SEED IMPLANT N/A 05/17/2015   Procedure: RADIOACTIVE SEED IMPLANT/BRACHYTHERAPY IMPLANT;  Surgeon: Kathie Rhodes, MD;  Location: [place];  Service: Urology;  Laterality: N/A;   SPINAL CORD STIMULATOR IMPLANT     Spinal cord stimulator implantation  06/2020   HELPS!   TRANSRECTAL ULTRASOUND N/A 11/12/2014   Procedure: TRANSRECTAL ULTRASOUND AND BIOPSY  ;  Surgeon: Kathie Rhodes, MD;  Location: [facility];  Service: Urology;  Laterality: N/A;   TRANSTHORACIC ECHOCARDIOGRAM  03/29/2014   mild concentric LVH/  ef 55-60%/  grd I DD. AV poorly visualized, possibly bicupid;  mild AS, valve area 1.78cm^2, mean grandient 69m Hg/  trivial TR.  03/21/21: EF 60-65%, mod aortic stenosis (mean 21, peak 35, area 1.14 cm2), aortic valve is tricuspid,, mild  MR, nl LV syst fxn, grd I DD.     Current Outpatient Medications:    amphetamine-dextroamphetamine (ADDERALL XR) 30 MG 24 hr capsule, 2 tabs po qAM, Disp: 60 capsule, Rfl: 0   b complex vitamins capsule, Take 1 capsule by mouth daily., Disp: , Rfl:    celecoxib (CELEBREX) 200 MG capsule, Take 1 capsule (200 mg total) by mouth daily., Disp: 90 capsule, Rfl: 3   gabapentin (NEURONTIN) 600 MG tablet, Take 600 mg by mouth 2 (two) times daily., Disp: , Rfl:    Magnesium 500 MG TABS, Take 500 mg by mouth daily., Disp: , Rfl:    meclizine (ANTIVERT) 25 MG tablet, TAKE 1 TABLET BY MOUTH THREE TIMES  A DAY AS NEEDED FOR DIZZINESS (Patient not taking: Reported on 04/08/2022), Disp: 30 tablet, Rfl: 3   Melatonin 3 MG CAPS, Take by mouth., Disp: , Rfl:    Multiple Vitamin (MULTIVITAMIN) tablet, Take 1 tablet by mouth daily., Disp: , Rfl:    rOPINIRole (REQUIP) 1 MG tablet, TAKE 5 TABS PO QHS, Disp: 450 tablet, Rfl: 1   rosuvastatin (CRESTOR) 10 MG tablet, TAKE 1 TABLET BY MOUTH EVERY DAY, Disp: 90 tablet, Rfl: 3   sertraline (ZOLOFT) 50 MG tablet, Take 50 mg by mouth., Disp: , Rfl:    tadalafil (CIALIS) 5 MG tablet, Take 5 mg by mouth daily., Disp: , Rfl:    tamsulosin (FLOMAX) 0.4 MG CAPS capsule, Take 0.4 mg by mouth daily., Disp: , Rfl:    TART CHERRY PO, Take by mouth daily., Disp: , Rfl:    traZODone (DESYREL) 50 MG tablet, TAKE 1 TO 2 TABLETS BY MOUTH EVERY DAY AT BEDTIME AS NEEDED FOR INSOMNIA, Disp: 180 tablet, Rfl: 0   valsartan (DIOVAN) 80 MG tablet, Take 1 tablet (80 mg total) by mouth daily., Disp: 90 tablet, Rfl: 1   VITAMIN D PO, Take 50,000 Units by mouth 2 (two) times daily., Disp: , Rfl:   EXAM:  VITALS per patient if applicable:    1/88/4166    2:44 PM 02/20/2022    1:50 PM 02/20/2022    1:15 PM  Vitals with BMI  Height   '6\' 3"'$   Weight 230 lbs  241 lbs 10 oz  BMI   06.3  Systolic  016 010  Diastolic  82 71  Pulse   71     GENERAL: alert, oriented, appears well and in no  acute distress  HEENT: atraumatic, conjunttiva clear, no obvious abnormalities on inspection of external nose and ears  NECK: normal movements of the head and neck  LUNGS: on inspection no signs of respiratory distress, breathing rate appears normal, no obvious gross SOB, gasping or wheezing  CV: no obvious cyanosis  MS: moves all visible extremities without noticeable abnormality  PSYCH/NEURO: pleasant and cooperative, no obvious depression or anxiety, speech and thought processing grossly intact  LABS: none today    Chemistry      Component Value Date/Time   NA 141 02/20/2022 1404   NA 141 09/26/2018 0000   K 4.9 02/20/2022 1404   CL 105 02/20/2022 1404   CO2 34 (H) 02/20/2022 1404   BUN 15 02/20/2022 1404   BUN 20 09/26/2018 0000   CREATININE 0.97 02/20/2022 1404   CREATININE 0.82 01/27/2021 1359   GLU 95 09/26/2018 0000      Component Value Date/Time   CALCIUM 9.4 02/20/2022 1404   ALKPHOS 59 02/20/2022 1404   AST 24 02/20/2022 1404   ALT 22 02/20/2022 1404   BILITOT 0.9 02/20/2022 1404      ASSESSMENT AND PLAN:  Discussed the following assessment and plan:  Chronic/recurrent vertigo.  Question central vertigo. Also question vertebrobasilar insufficiency. Intermittently he has ataxia as well as double vision associated with his episodes. Seems to be progressive over the last 3 months. I want to get MRI brain without contrast and MR angio head. I think it is okay for him to restart meclizine since this had been helping in the past and gave him a decent measure of functionality. Also, I recommend he restart vestibular rehab.  He will give [place] a call if he needs a new referral.  I discussed the assessment and treatment plan with the  patient. The patient was provided an opportunity to ask questions and all were answered. The patient agreed with the plan and demonstrated an understanding of the instructions.   F/u: TBD  Signed:  Crissie Sickles, MD            04/08/2022

## 2022-04-08 NOTE — Telephone Encounter (Signed)
Pt was contacted and offered change to virtual for appt. Appt has been changed and advised we will address concerns mentioned during appt

## 2022-04-09 DIAGNOSIS — F411 Generalized anxiety disorder: Secondary | ICD-10-CM | POA: Diagnosis not present

## 2022-04-14 ENCOUNTER — Other Ambulatory Visit: Payer: Self-pay | Admitting: Family Medicine

## 2022-04-14 NOTE — Telephone Encounter (Signed)
Patient is needing refill on Meclizine and Adderall.

## 2022-04-14 NOTE — Telephone Encounter (Signed)
Requesting: adderall  Contract: 02/20/22  UDS: 09/11/20 Last Visit: 04/08/22 Next Visit: 08/24/22 Last Refill: 02/20/22 (60,0)   RF request for meclizine LOV: 04/08/22 Next ov: 08/24/22 Last written: 05/01/21 (30,3)  Please Advise. Meds pending

## 2022-04-15 MED ORDER — MECLIZINE HCL 25 MG PO TABS: ORAL_TABLET | ORAL | 3 refills | Status: DC

## 2022-04-15 MED ORDER — AMPHETAMINE-DEXTROAMPHET ER 30 MG PO CP24: ORAL_CAPSULE | ORAL | 0 refills | Status: DC

## 2022-04-16 ENCOUNTER — Ambulatory Visit (INDEPENDENT_AMBULATORY_CARE_PROVIDER_SITE_OTHER): Payer: PPO

## 2022-04-16 DIAGNOSIS — Z0000 Encounter for general adult medical examination without abnormal findings: Secondary | ICD-10-CM | POA: Diagnosis not present

## 2022-04-16 NOTE — Progress Notes (Signed)
Subjective:   Andrew Stevenson is a 77 y.o. male who presents for an Initial Medicare Annual Wellness Visit.  I connected with  Andrew Stevenson on 04/16/22 by an audio only telemedicine application and verified that I am speaking with the correct person using two identifiers.   I discussed the limitations, risks, security and privacy concerns of performing an evaluation and management service by telephone and the availability of in person appointments. I also discussed with the patient that there may be a patient responsible charge related to this service. The patient expressed understanding and verbally consented to this telephonic visit.  Location of Patient: home Location of Provider: office  List any persons and their role that are participating in the visit with the patient.   Andrew Stevenson, CMA  Review of Systems    Defer to PCP Cardiac Risk Factors include: advanced age (>38mn, >>22women);male gender     Objective:    There were no vitals filed for this visit. There is no height or weight on file to calculate BMI.     04/16/2022    1:55 PM 09/03/2020    1:29 PM 05/04/2018    6:44 PM 06/13/2015   10:30 AM 05/17/2015    8:50 AM 02/22/2015   11:51 AM 11/12/2014    6:07 AM  Advanced Directives  Does Patient Have a Medical Advance Directive? No No No No No No No  Would patient like information on creating a medical advance directive? No - Patient declined   No - patient declined information No - patient declined information No - patient declined information Yes - Educational materials given    Current Medications (verified) Outpatient Encounter Medications as of 04/16/2022  Medication Sig   amphetamine-dextroamphetamine (ADDERALL XR) 30 MG 24 hr capsule 2 tabs po qAM   b complex vitamins capsule Take 1 capsule by mouth daily.   celecoxib (CELEBREX) 200 MG capsule Take 1 capsule (200 mg total) by mouth daily.   gabapentin (NEURONTIN) 600 MG tablet Take 600 mg by mouth 2  (two) times daily.   Magnesium 500 MG TABS Take 500 mg by mouth daily.   meclizine (ANTIVERT) 25 MG tablet TAKE 1 TABLET BY MOUTH THREE TIMES A DAY AS NEEDED FOR DIZZINESS   Melatonin 3 MG CAPS Take by mouth.   Multiple Vitamin (MULTIVITAMIN) tablet Take 1 tablet by mouth daily.   rOPINIRole (REQUIP) 1 MG tablet TAKE 5 TABS PO QHS   rosuvastatin (CRESTOR) 10 MG tablet TAKE 1 TABLET BY MOUTH EVERY DAY   sertraline (ZOLOFT) 50 MG tablet Take 50 mg by mouth.   tadalafil (CIALIS) 5 MG tablet Take 5 mg by mouth daily.   tamsulosin (FLOMAX) 0.4 MG CAPS capsule Take 0.4 mg by mouth daily.   TART CHERRY PO Take by mouth daily.   traZODone (DESYREL) 50 MG tablet TAKE 1 TO 2 TABLETS BY MOUTH EVERY DAY AT BEDTIME AS NEEDED FOR INSOMNIA   valsartan (DIOVAN) 80 MG tablet Take 1 tablet (80 mg total) by mouth daily.   VITAMIN D PO Take 50,000 Units by mouth 2 (two) times daily.   No facility-administered encounter medications on file as of 04/16/2022.    Allergies (verified) Methylphenidate, Other, Oxycodone, and Penicillins   History: Past Medical History:  Diagnosis Date   Adult ADHD    adderall caused elevated bp   Anxiety and depression    in past/ due to back injuries   At risk for sleep apnea  STOP-BANG= 5      SENT TO PCP 11-05-2014   Benign paroxysmal positional vertigo    Branch retinal artery occlusion of right eye    COVID-19 virus infection 09/2020   Depression    Disequilibrium syndrome    Carotid dopplers and MRI brain NORMAL x 2 (including eighth nerve studies on most recent MRI 08/2016).   Erectile dysfunction    Failed back surgical syndrome    Dr. Sherley Bounds 04/16/20->ref to pain mgmt-> SPINAL CORD STIMULATOR helpful (2022)   Gross hematuria    passed a stone prior to the planned CT 10/2020   Heart murmur    History of concussion    june 2015--  hit head w/ syncope---  no residual   History of memory loss    per prior PCP records./ per pt due to medication   History of  syncope    june 2015 --syncope w/ fall and pt states has happened 5 more times , the last one being jan 2016,,  states had battery of test and unknown cause   Hx of adenomatous colonic polyps 05/25/2017   Recall 05/2022   Hyperlipidemia 02/2016   atorva 02/2016---lipid #s worse so changed to generic crestor.  Much improved on crestor.   IFG (impaired fasting glucose) 02/2016   HbA1c 5 %.   Insomnia    Moderate aortic stenosis    Plan repeat echo 03/2022   Myalgia and myositis, unspecified    Nephrolithiasis    passed stone 10/2020   OAB (overactive bladder)    Osteoarthritis, multiple sites    C-spine, L spine, knees, "all my joints basically"   Prostate cancer Andrew Stevenson   2016 Stage T1c,  Gleason 4+3,  PSA 5.26, vol 49.19cc.  No biochemical recurrence as of 11/2020.   RLS (restless legs syndrome)    Spinal stenosis, lumbar region with neurogenic claudication    s/p L3-S1 fusion revision 09/2018. Pain worse 05/2019->MRI showed ankylosis + surg fusion, L2-3 being the only mobile level of lower spine. SPINAL CORD STIMULATOR->helpful.   Thrombocytopenia (HCC)    >100K.  Mild, chronic   Upper airway cough syndrome 2018   Dr. Halford Chessman.  Cough therapy maximized at f/u 07/2016; referred to ENT by Pulm.  Allergy testing OK. Improved as of 08/05/16 pulm f/u.   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 Felecia Shelling)   [place]  last one 2010   2 times   COLONOSCOPY  02/2004; 05/2017   2019: adenomatous polyp, int hem, diverticulosis, radiation proctitis.  Recall 05/2022.   EYE SURGERY Bilateral 2013   5 right eye, 3 left eye surgeries (including cataract extraction's iol w/ lens implant,  other surgery's for post-op lens fungal infection and floaters)   GOLD SEED IMPLANT N/A 02/22/2015   Procedure: GOLD SEED IMPLANT;  Surgeon: Kathie Rhodes, MD;  Location: [facility];  Service: Urology;  Laterality: N/A;   LEFT THIGH QUADRICEP MUSCLE BIOPSY'S  12/31/2005   LUMBAR FUSION  x2  last one 2010   4 surgeries in back per pt   MRA/MRI w/out contrast--brain  12/2014   mild, age-related chronic microvascular ischemic change (Dr. Felecia Shelling).  RADIOACTIVE SEED IMPLANT N/A 05/17/2015   Procedure: RADIOACTIVE SEED IMPLANT/BRACHYTHERAPY IMPLANT;  Surgeon: Kathie Rhodes, MD;  Location: [place];  Service: Urology;  Laterality: N/A;   SPINAL CORD STIMULATOR IMPLANT     Spinal cord stimulator implantation  06/2020   HELPS!   TRANSRECTAL ULTRASOUND N/A 11/12/2014   Procedure: TRANSRECTAL ULTRASOUND AND BIOPSY  ;  Surgeon: Kathie Rhodes, MD;  Location[facility];  Service: Urology;  Laterality: N/A;   TRANSTHORACIC ECHOCARDIOGRAM  03/29/2014   mild concentric LVH/  ef 55-60%/  grd I DD. AV poorly visualized, possibly bicupid;  mild AS, valve area 1.78cm^2, mean grandient 55m Hg/  trivial TR.  03/21/21: EF 60-65%, mod aortic stenosis (mean 21, peak 35, area 1.14 cm2), aortic valve is tricuspid,, mild MR, nl LV syst fxn, grd I DD.   Family History  Problem Relation Age of Onset   Hypertension Mother    Alcohol abuse Father    Arthritis Father    Lung cancer Father        smoker   Diabetes Brother    Alcohol abuse Maternal Grandfather    Stroke Maternal Grandfather    Cancer Paternal Grandmother    Alcohol abuse Paternal Grandfather    Lung cancer Brother        smoker   Social History   Socioeconomic History   Marital status: Married    Spouse name: Not on file   Number of children: Not on file   Years of education: Not on file   Highest education level: Not on file  Occupational History   Occupation: UNEMPLOYED  Tobacco Use   Smoking  status: Some Days    Types: Cigarettes, Cigars   Smokeless tobacco: Former    Types: Snuff    Quit date: 04/12/2018   Tobacco comments:    using snuff x 25 years  Vaping Use   Vaping Use: Never used  Substance and Sexual Activity   Alcohol use: Yes    Alcohol/week: 0.0 standard drinks of alcohol    Comment: rare   Drug use: No   Sexual activity: Not on file  Other Topics Concern   Not on file  Social History Narrative   Married, 1 daughter.   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   No tob.   Occ alcohol.   Social Determinants of Health   Financial Resource Strain: Low Risk  (04/16/2022)   Overall Financial Resource Strain (CARDIA)    Difficulty of Paying Living Expenses: Not hard at all  Food Insecurity: No Food Insecurity (04/16/2022)   Hunger Vital Sign    Worried About Running Out of Food in the Last Year: Never true    Ran Out of Food in the Last Year: Never true  Transportation Needs: No Transportation Needs (04/16/2022)   PRAPARE - THydrologist(Medical): No    Lack of Transportation (Non-Medical): No  Physical Activity: Inactive (04/16/2022)   Exercise Vital Sign    Days of Exercise per Week: 0 days    Minutes of Exercise per Session: 0 min  Stress: No Stress Concern Present (04/16/2022)   [place]   Feeling of Stress : Not at all  Social Connections: Moderately Integrated (04/16/2022)   Social Connection and Isolation Panel [NHANES]    Frequency of Communication with Friends and Family: More than three times a week  Frequency of Social Gatherings with Friends and Family: Once a week    Attends Religious Services: More than 4 times per year    Active Member of [employer] or Organizations: No    Attends Music therapist: Never    Marital Status: Married    Tobacco Counseling Ready to quit: Not Answered Counseling given:  Not Answered Tobacco comments: using snuff x 25 years   Clinical Intake:  Pre-visit preparation completed: No  Pain : No/denies pain     Nutritional Risks: None Diabetes: No  How often do you need to have someone help you when you read instructions, pamphlets, or other written materials from your doctor or pharmacy?: 1 - Never What is the last grade level you completed in school?: 12th  Diabetic?no         Activities of Daily Living    04/16/2022    1:53 PM  In your present state of health, do you have any difficulty performing the following activities:  Hearing? 0  Vision? 0  Difficulty concentrating or making decisions? 0  Walking or climbing stairs? 0  Dressing or bathing? 0  Doing errands, shopping? 0  Preparing Food and eating ? N  Using the Toilet? N  In the past six months, have you accidently leaked urine? Y  Do you have problems with loss of bowel control? N  Managing your Medications? N  Managing your Finances? N  Housekeeping or managing your Housekeeping? N    Patient Care Team: Tammi Sou, MD as PCP - General (Family Medicine) Adrian Prows, MD as Consulting Physician (Cardiology) Tyler Pita, MD as Consulting Physician (Radiation Oncology) Chesley Mires, MD as Consulting Physician (Pulmonary Disease) Gatha Mayer, MD as Consulting Physician (Gastroenterology) Zonia Kief, MD as Consulting Physician (Rehabilitation) Starling Manns, MD as Consulting Physician (Orthopedic Surgery) Eustace Moore, MD as Consulting Physician (Neurosurgery) Ardis Hughs, MD as Consulting Physician (Urology) Jolene Provost, PA-C as Physician Assistant (Otolaryngology)  Indicate any recent Medical Services you may have received from other than [facility] providers in the past year (date may be approximate).     Assessment:   This is a routine wellness examination for Devontay.  Hearing/Vision screen No results found.  Dietary issues and exercise  activities discussed: Current Exercise Habits: The patient does not participate in regular exercise at present, Exercise limited by: None identified   Goals Addressed               This Visit's Progress     Stay alive (pt-stated)        Depression Screen    04/16/2022    1:53 PM 06/06/2021    1:05 PM 01/27/2021    1:08 PM 09/11/2020   10:12 AM 05/13/2018    8:43 AM 02/26/2017    9:55 AM 06/13/2015   10:30 AM  PHQ 2/9 Scores  PHQ - 2 Score 0 0 0 0 1 2 0  PHQ- 9 Score     3 7     Fall Risk    04/16/2022    1:52 PM 06/06/2021    1:04 PM 01/27/2021    1:08 PM 09/11/2020   10:12 AM 07/13/2019    8:06 AM  Fall Risk   Falls in the past year? 1 0 0 0 0  Number falls in past yr: 0 0 0 0 0  Injury with Fall? 0 0 0 0 0  Risk for fall due to : Impaired balance/gait  Follow up Falls evaluation completed Falls evaluation completed Falls evaluation completed Falls evaluation completed Falls evaluation completed    [place]:  Any stairs in or around the home? Yes  If so, are there any without handrails? No  Home free of loose throw rugs in walkways, pet beds, electrical cords, etc? Yes  Adequate lighting in your home to reduce risk of falls? Yes   ASSISTIVE DEVICES UTILIZED TO PREVENT FALLS:  Life alert? No  Use of a cane, walker or w/c? No  Grab bars in the bathroom? No  Shower chair or bench in shower? No  Elevated toilet seat or a handicapped toilet? No   TIMED UP AND GO:  Was the test performed? No .  Length of time to ambulate 10 feet: n/a sec.     Cognitive Function:        04/16/2022    1:56 PM  6CIT Screen  What Year? 0 points  What month? 0 points  What time? 0 points  Count back from 20 0 points  Months in reverse 0 points  Repeat phrase 0 points  Total Score 0 points    Immunizations Immunization History  Administered Date(s) Administered   COVID-19, mRNA, vaccine(Comirnaty)12 years and older 12/15/2021   Fluad  Quad(high Dose 65+) 12/19/2018, 01/24/2020, 12/15/2021   Influenza, High Dose Seasonal PF 06/02/2018   PFIZER(Purple Top)SARS-COV-2 Vaccination 04/30/2019, 05/23/2019, 03/28/2020   Pneumococcal Conjugate-13 02/26/2017   Pneumococcal Polysaccharide-23 06/30/2011   Tdap 12/21/2020   Zoster Recombinat (Shingrix) 06/02/2018, 09/11/2020   Zoster, Live 06/30/2011    TDAP status: Up to date  Flu Vaccine status: Up to date  Pneumococcal vaccine status: Up to date  Covid-19 vaccine status: Completed vaccines  Qualifies for Shingles Vaccine? Yes   Zostavax completed No   Shingrix Completed?: Yes  Screening Tests Health Maintenance  Topic Date Due   COVID-19 Vaccine (5 - 2023-24 season) 04/24/2022 (Originally 02/09/2022)   COLONOSCOPY (Pts 45-19yr Insurance coverage will need to be confirmed)  05/26/2022   Medicare Annual Wellness (AWV)  04/17/2023   DTaP/Tdap/Td (2 - Td or Tdap) 12/22/2030   Pneumonia Vaccine 77 Years old  Completed   INFLUENZA VACCINE  Completed   Hepatitis C Screening  Completed   Zoster Vaccines- Shingrix  Completed   HPV VACCINES  Aged Out    Health Maintenance  There are no preventive care reminders to display for this patient.   Colorectal cancer screening: No longer required.   Lung Cancer Screening: (Low Dose CT Chest recommended if Age 245-80years, 30 pack-year currently smoking OR have quit w/in 15years.) does qualify.   Lung Cancer Screening Referral: n/a  Additional Screening:  Hepatitis C Screening: does qualify; Completed 02/27/2016  Vision Screening: Recommended annual ophthalmology exams for early detection of glaucoma and other disorders of the eye. Is the patient up to date with their annual eye exam?  Yes  Who is the provider or what is the name of the office in which the patient attends annual eye exams? N/A If pt is not established with a provider, would they like to be referred to a provider to establish care? No .   Dental  Screening: Recommended annual dental exams for proper oral hygiene  Community Resource Referral / Chronic Care Management: CRR required this visit?  No   CCM required this visit?  No      Plan:     I have personally reviewed and noted the following in the  patient's chart:   Medical and social history Use of alcohol, tobacco or illicit drugs  Current medications and supplements including opioid prescriptions. Patient is not currently taking opioid prescriptions. Functional ability and status Nutritional status Physical activity Advanced directives List of other physicians Hospitalizations, surgeries, and ER visits in previous 12 months Vitals Screenings to include cognitive, depression, and falls Referrals and appointments  In addition, I have reviewed and discussed with patient certain preventive protocols, quality metrics, and best practice recommendations. A written personalized care plan for preventive services as well as general preventive health recommendations were provided to patient.     Beatrix Fetters, [place]   04/16/2022   Nurse Notes: Non-Face to Face or Face to Face 12 minute visit Encounter    Mr. Rivkin , Thank you for taking time to come for your Medicare Wellness Visit. I appreciate your ongoing commitment to your health goals. Please review the following plan we discussed and let me know if I can assist you in the future.   These are the goals we discussed:  Goals       Stay alive (pt-stated)        This is a list of the screening recommended for you and due dates:  Health Maintenance  Topic Date Due   COVID-19 Vaccine (5 - 2023-24 season) 04/24/2022*   Colon Cancer Screening  05/26/2022   Medicare Annual Wellness Visit  04/17/2023   DTaP/Tdap/Td vaccine (2 - Td or Tdap) 12/22/2030   Pneumonia Vaccine  Completed   Flu Shot  Completed   Hepatitis C Screening: USPSTF Recommendation to screen - Ages 18-79 yo.  Completed   Zoster (Shingles) Vaccine   Completed   HPV Vaccine  Aged Out  *Topic was postponed. The date shown is not the original due date.

## 2022-04-21 DIAGNOSIS — M5416 Radiculopathy, lumbar region: Secondary | ICD-10-CM | POA: Diagnosis not present

## 2022-04-21 DIAGNOSIS — M546 Pain in thoracic spine: Secondary | ICD-10-CM | POA: Diagnosis not present

## 2022-04-22 ENCOUNTER — Other Ambulatory Visit: Payer: Self-pay | Admitting: Student

## 2022-04-22 DIAGNOSIS — M5416 Radiculopathy, lumbar region: Secondary | ICD-10-CM

## 2022-04-23 ENCOUNTER — Ambulatory Visit
Admission: RE | Admit: 2022-04-23 | Discharge: 2022-04-23 | Disposition: A | Discharge status: Home / Self Care | Payer: PPO | Source: Ambulatory Visit | Attending: Student | Admitting: Student

## 2022-04-23 ENCOUNTER — Other Ambulatory Visit: Payer: Self-pay | Admitting: Student

## 2022-04-23 DIAGNOSIS — M5416 Radiculopathy, lumbar region: Secondary | ICD-10-CM

## 2022-04-23 MED ORDER — DIAZEPAM 5 MG PO TABS: 5.0000 mg | ORAL_TABLET | Freq: Once | ORAL | Status: DC

## 2022-04-23 MED ORDER — ONDANSETRON HCL 4 MG/2ML IJ SOLN: 4.0000 mg | Freq: Once | INTRAMUSCULAR | Status: DC | PRN

## 2022-04-23 MED ORDER — MEPERIDINE HCL 50 MG/ML IJ SOLN: 50.0000 mg | Freq: Once | INTRAMUSCULAR | Status: DC | PRN

## 2022-04-23 NOTE — Discharge Instructions (Signed)

## 2022-04-28 ENCOUNTER — Telehealth (HOSPITAL_BASED_OUTPATIENT_CLINIC_OR_DEPARTMENT_OTHER): Payer: Self-pay

## 2022-04-28 DIAGNOSIS — R42 Dizziness and giddiness: Secondary | ICD-10-CM

## 2022-04-28 DIAGNOSIS — Z6828 Body mass index (BMI) 28.0-28.9, adult: Secondary | ICD-10-CM | POA: Diagnosis not present

## 2022-04-28 DIAGNOSIS — M5416 Radiculopathy, lumbar region: Secondary | ICD-10-CM | POA: Diagnosis not present

## 2022-04-28 DIAGNOSIS — M542 Cervicalgia: Secondary | ICD-10-CM | POA: Diagnosis not present

## 2022-04-29 ENCOUNTER — Other Ambulatory Visit: Payer: Self-pay | Admitting: Student

## 2022-04-29 ENCOUNTER — Other Ambulatory Visit: Payer: Self-pay | Admitting: Family Medicine

## 2022-04-29 DIAGNOSIS — M5416 Radiculopathy, lumbar region: Secondary | ICD-10-CM

## 2022-04-29 NOTE — Telephone Encounter (Signed)
Andrew Stevenson called to request orders changed from MRI to CT due to spinal cord stimulator.   Please Advise

## 2022-04-30 ENCOUNTER — Ambulatory Visit
Admission: RE | Admit: 2022-04-30 | Discharge: 2022-04-30 | Disposition: A | Discharge status: Home / Self Care | Payer: PPO | Source: Ambulatory Visit | Attending: Student | Admitting: Student

## 2022-04-30 DIAGNOSIS — Z981 Arthrodesis status: Secondary | ICD-10-CM | POA: Diagnosis not present

## 2022-04-30 DIAGNOSIS — M47816 Spondylosis without myelopathy or radiculopathy, lumbar region: Secondary | ICD-10-CM | POA: Diagnosis not present

## 2022-04-30 DIAGNOSIS — M4802 Spinal stenosis, cervical region: Secondary | ICD-10-CM | POA: Diagnosis not present

## 2022-04-30 DIAGNOSIS — M5416 Radiculopathy, lumbar region: Secondary | ICD-10-CM

## 2022-04-30 MED ORDER — MEPERIDINE HCL 50 MG/ML IJ SOLN: 50.0000 mg | Freq: Once | INTRAMUSCULAR | Status: DC | PRN

## 2022-04-30 MED ORDER — ONDANSETRON HCL 4 MG/2ML IJ SOLN: 4.0000 mg | Freq: Once | INTRAMUSCULAR | Status: DC | PRN

## 2022-04-30 MED ORDER — IOPAMIDOL (ISOVUE-M 300) INJECTION 61%
10.0000 mL | Freq: Once | INTRAMUSCULAR | Status: AC | PRN
  Administered 2022-04-30: 10 mL via INTRATHECAL

## 2022-04-30 MED ORDER — DIAZEPAM 5 MG PO TABS
5.0000 mg | ORAL_TABLET | Freq: Once | ORAL | Status: AC
  Administered 2022-04-30: 5 mg via ORAL

## 2022-04-30 NOTE — Discharge Instructions (Signed)

## 2022-04-30 NOTE — Progress Notes (Addendum)
Pt reports his spinal cord stimulator has been turned off for myelogram procedure.

## 2022-05-01 NOTE — Telephone Encounter (Signed)
Please notify patient that he cannot have an MRI because of spinal stimulator.  There is no alternative imaging test that will give [place] any good information at this point. The next step is to get a neurology evaluation.  If he is in favor of this then please refer to [facility] neurology, diagnosis vertigo.

## 2022-05-01 NOTE — Addendum Note (Signed)
Addended by: Deveron Furlong D on: 05/01/2022 09:19 AM   Modules accepted: Orders

## 2022-05-01 NOTE — Addendum Note (Signed)
Addended by: Deveron Furlong D on: 05/01/2022 02:39 PM   Modules accepted: Orders

## 2022-05-01 NOTE — Telephone Encounter (Signed)
Called pt and he had CT lumbar spine with contrast, CT cervical spine with contrast, DG myelography. Pt is concerned about results. He also wanted to follow up about brain scan ordered on 1/24 but has been cancelled.  Please Advise

## 2022-05-01 NOTE — Telephone Encounter (Signed)
LVM for pt to discuss. Referral pending

## 2022-05-05 DIAGNOSIS — Z6828 Body mass index (BMI) 28.0-28.9, adult: Secondary | ICD-10-CM | POA: Diagnosis not present

## 2022-05-05 DIAGNOSIS — M5416 Radiculopathy, lumbar region: Secondary | ICD-10-CM | POA: Diagnosis not present

## 2022-05-05 DIAGNOSIS — M542 Cervicalgia: Secondary | ICD-10-CM | POA: Diagnosis not present

## 2022-05-11 DIAGNOSIS — M5412 Radiculopathy, cervical region: Secondary | ICD-10-CM | POA: Diagnosis not present

## 2022-05-21 DIAGNOSIS — F411 Generalized anxiety disorder: Secondary | ICD-10-CM | POA: Diagnosis not present

## 2022-05-28 ENCOUNTER — Encounter: Payer: Self-pay | Admitting: Family Medicine

## 2022-05-28 DIAGNOSIS — M25561 Pain in right knee: Secondary | ICD-10-CM | POA: Diagnosis not present

## 2022-05-28 DIAGNOSIS — G8929 Other chronic pain: Secondary | ICD-10-CM | POA: Diagnosis not present

## 2022-05-28 DIAGNOSIS — M25562 Pain in left knee: Secondary | ICD-10-CM | POA: Diagnosis not present

## 2022-05-28 DIAGNOSIS — R42 Dizziness and giddiness: Secondary | ICD-10-CM | POA: Diagnosis not present

## 2022-05-29 DIAGNOSIS — M19072 Primary osteoarthritis, left ankle and foot: Secondary | ICD-10-CM | POA: Diagnosis not present

## 2022-05-29 DIAGNOSIS — M170 Bilateral primary osteoarthritis of knee: Secondary | ICD-10-CM | POA: Diagnosis not present

## 2022-05-29 DIAGNOSIS — M19071 Primary osteoarthritis, right ankle and foot: Secondary | ICD-10-CM | POA: Diagnosis not present

## 2022-06-03 ENCOUNTER — Other Ambulatory Visit (HOSPITAL_COMMUNITY): Payer: Self-pay | Admitting: Neurological Surgery

## 2022-06-03 DIAGNOSIS — R42 Dizziness and giddiness: Secondary | ICD-10-CM

## 2022-06-08 ENCOUNTER — Ambulatory Visit (HOSPITAL_COMMUNITY)
Admission: RE | Admit: 2022-06-08 | Discharge: 2022-06-08 | Disposition: A | Discharge status: Home / Self Care | Payer: PPO | Source: Ambulatory Visit | Attending: Neurological Surgery | Admitting: Neurological Surgery

## 2022-06-08 DIAGNOSIS — R42 Dizziness and giddiness: Secondary | ICD-10-CM | POA: Insufficient documentation

## 2022-06-09 DIAGNOSIS — M542 Cervicalgia: Secondary | ICD-10-CM | POA: Diagnosis not present

## 2022-06-09 DIAGNOSIS — Z6827 Body mass index (BMI) 27.0-27.9, adult: Secondary | ICD-10-CM | POA: Diagnosis not present

## 2022-06-09 DIAGNOSIS — M5416 Radiculopathy, lumbar region: Secondary | ICD-10-CM | POA: Diagnosis not present

## 2022-06-11 ENCOUNTER — Other Ambulatory Visit: Payer: Self-pay | Admitting: Family Medicine

## 2022-06-11 MED ORDER — TRAZODONE HCL 50 MG PO TABS: ORAL_TABLET | ORAL | 1 refills | Status: DC

## 2022-06-11 MED ORDER — AMPHETAMINE-DEXTROAMPHET ER 30 MG PO CP24: ORAL_CAPSULE | ORAL | 0 refills | Status: DC

## 2022-06-11 NOTE — Telephone Encounter (Signed)
Requesting: adderall Contract: 02/20/22 UDS: 09/11/20 Last Visit: 04/08/22 Next Visit: 08/24/22 Last Refill: 04/15/22 (60,0)  RF request for trazodone LOV: 04/08/22 Next ov: 08/24/22 Last written: 12/02/21 (180,0)   Please Advise. Meds pending

## 2022-06-11 NOTE — Telephone Encounter (Signed)
Pt is requesting refills on Adderall and Trazodone. Pharmacy is confirmed as [facility] in [place]

## 2022-06-16 ENCOUNTER — Telehealth: Payer: Self-pay

## 2022-06-16 DIAGNOSIS — Z1211 Encounter for screening for malignant neoplasm of colon: Secondary | ICD-10-CM

## 2022-06-16 NOTE — Telephone Encounter (Signed)
GI referral order placed, pt was made aware and advised to call their office if no call received within the next 2 weeks. He was given their contact information.

## 2022-06-18 DIAGNOSIS — F411 Generalized anxiety disorder: Secondary | ICD-10-CM | POA: Diagnosis not present

## 2022-06-22 DIAGNOSIS — H5319 Other subjective visual disturbances: Secondary | ICD-10-CM | POA: Diagnosis not present

## 2022-06-22 DIAGNOSIS — H5111 Convergence insufficiency: Secondary | ICD-10-CM | POA: Diagnosis not present

## 2022-06-22 DIAGNOSIS — H35373 Puckering of macula, bilateral: Secondary | ICD-10-CM | POA: Diagnosis not present

## 2022-06-26 DIAGNOSIS — Z8546 Personal history of malignant neoplasm of prostate: Secondary | ICD-10-CM | POA: Diagnosis not present

## 2022-06-30 ENCOUNTER — Encounter: Payer: Self-pay | Admitting: Internal Medicine

## 2022-07-03 DIAGNOSIS — Z8546 Personal history of malignant neoplasm of prostate: Secondary | ICD-10-CM | POA: Diagnosis not present

## 2022-07-03 DIAGNOSIS — R3915 Urgency of urination: Secondary | ICD-10-CM | POA: Diagnosis not present

## 2022-07-08 DIAGNOSIS — M47816 Spondylosis without myelopathy or radiculopathy, lumbar region: Secondary | ICD-10-CM | POA: Diagnosis not present

## 2022-07-29 ENCOUNTER — Telehealth: Payer: Self-pay | Admitting: Family Medicine

## 2022-07-29 MED ORDER — AMPHETAMINE-DEXTROAMPHET ER 30 MG PO CP24: ORAL_CAPSULE | ORAL | 0 refills | Status: DC

## 2022-07-29 NOTE — Telephone Encounter (Signed)
Refill requested for Adderall XR sent to [facility] in [facility]. Next OV is 6/10

## 2022-07-29 NOTE — Telephone Encounter (Signed)
Okay, Adderall prescription sent 

## 2022-07-29 NOTE — Telephone Encounter (Signed)
Patient is needing a refill on amphetamine-dextroamphetamine (ADDERALL XR) 30 MG 24 hr capsule. Pharmacy is [facility] [place].

## 2022-08-20 DIAGNOSIS — F411 Generalized anxiety disorder: Secondary | ICD-10-CM | POA: Diagnosis not present

## 2022-08-24 ENCOUNTER — Encounter: Payer: PPO | Admitting: Family Medicine

## 2022-08-24 NOTE — Progress Notes (Deleted)
Office Note 08/24/2022  CC: No chief complaint on file.   HPI:  Patient is a 76 y.o. male who is here for annual health maintenance exam and 37-month follow-up hypertension, hyperlipidemia, and adult ADD.   PMP AWARE reviewed today: most recent rx for Adderall XR 30 mg was filled 07/29/2022, # 60, rx by me. No red flags.  Past Medical History:  Diagnosis Date   Adult ADHD    adderall caused elevated bp   Anxiety and depression    in past/ due to back injuries   Benign paroxysmal positional vertigo    Branch retinal artery occlusion of right eye    COVID-19 virus infection 09/2020   Disequilibrium syndrome    Carotid dopplers and MRI brain NORMAL x 2 (including eighth nerve studies on most recent MRI 08/2016).   Erectile dysfunction    Failed back surgical syndrome    Dr. Marikay Alar 04/16/20->ref to pain mgmt-> SPINAL CORD STIMULATOR helpful (2022)   Gross hematuria    passed a stone prior to the planned CT 10/2020   History of concussion    june 2015--  hit head w/ syncope---  no residual   History of memory loss    per prior PCP records./ per pt due to medication   History of syncope    june 2015 --syncope w/ fall and pt states has happened 5 more times , the last one being jan 2016,,  states had battery of test and unknown cause   Hx of adenomatous colonic polyps 05/25/2017   Recall 05/2022   Hyperlipidemia 02/2016   atorva 02/2016---lipid #s worse so changed to generic crestor.  Much improved on crestor.   IFG (impaired fasting glucose) 02/2016   HbA1c 5 %.   Insomnia    Moderate aortic stenosis    Plan repeat echo 03/2022   Nephrolithiasis    passed stone 10/2020   OAB (overactive bladder)    Osteoarthritis, multiple sites    C-spine, L spine, knees, "all my joints basically"   Prostate cancer [facility]) urologist-  dr ottelin/  oncologist-  dr Dayton Scrape   2016 Stage T1c,  Gleason 4+3,  PSA 5.26, vol 49.19cc.  No biochemical recurrence as of 11/2020.   RLS (restless legs  syndrome)    Spinal stenosis, lumbar region with neurogenic claudication    s/p L3-S1 fusion revision 09/2018. Pain worse 05/2019->MRI showed ankylosis + surg fusion, L2-3 being the only mobile level of lower spine. SPINAL CORD STIMULATOR->helpful.   Thrombocytopenia (HCC)    >100K.  Mild, chronic   Upper airway cough syndrome 2018   Dr. Craige Cotta.  Cough therapy maximized at f/u 07/2016; referred to ENT by Pulm.  Allergy testing OK. Improved as of 08/05/16 pulm f/u.   Vertebrobasilar insufficiency    per neurologist note dr Epimenio Foot-- this is possible causing syncope-- no tx just be careful getting up from sitting position   White coat syndrome without hypertension     Past Surgical History:  Procedure Laterality Date   Carotid Dopplers  08/01/2014   NORMAL (Dr. Epimenio Foot)   CERVICAL FUSION  last one 2010   2 times   COLONOSCOPY  02/2004; 05/2017   2019: adenomatous polyp, int hem, diverticulosis, radiation proctitis.  Recall 05/2022.   EYE SURGERY Bilateral 2013   5 right eye, 3 left eye surgeries (including cataract extraction's iol w/ lens implant, other surgery's for post-op lens fungal infection and floaters)   GOLD SEED IMPLANT N/A 02/22/2015   Procedure: GOLD SEED IMPLANT;  Surgeon: Ihor Gully, MD;  Location: [facility];  Service: Urology;  Laterality: N/A;   LEFT THIGH QUADRICEP MUSCLE BIOPSY'S  12/31/2005   LUMBAR FUSION  x2  last one 2010   4 surgeries in back per pt   MRA/MRI w/out contrast--brain  12/2014   mild, age-related chronic microvascular ischemic change (Dr. Epimenio Foot).   RADIOACTIVE SEED IMPLANT N/A 05/17/2015   Procedure: RADIOACTIVE SEED IMPLANT/BRACHYTHERAPY IMPLANT;  Surgeon: Ihor Gully, MD;  Location: [facility] [place];  Service: Urology;  Laterality: N/A;   SPINAL CORD STIMULATOR IMPLANT     Spinal cord stimulator implantation  06/2020   HELPS!   TRANSRECTAL ULTRASOUND N/A 11/12/2014   Procedure: TRANSRECTAL ULTRASOUND AND BIOPSY  ;  Surgeon:  Ihor Gully, MD;  Location: [facility];  Service: Urology;  Laterality: N/A;   TRANSTHORACIC ECHOCARDIOGRAM  03/29/2014   mild concentric LVH/  ef 55-60%/  grd I DD. AV poorly visualized, possibly bicupid;  mild AS, valve area 1.78cm^2, mean grandient 16mm Hg/  trivial TR.  03/21/21: EF 60-65%, mod aortic stenosis (mean 21, peak 35, area 1.14 cm2), aortic valve is tricuspid,, mild MR, nl LV syst fxn, grd I DD.    Family History  Problem Relation Age of Onset   Hypertension Mother    Alcohol abuse Father    Arthritis Father    Lung cancer Father        smoker   Diabetes Brother    Alcohol abuse Maternal Grandfather    Stroke Maternal Grandfather    Cancer Paternal Grandmother    Alcohol abuse Paternal Grandfather    Lung cancer Brother        smoker    Social History   Socioeconomic History   Marital status: Married    Spouse name: Not on file   Number of children: Not on file   Years of education: Not on file   Highest education level: Not on file  Occupational History   Occupation: UNEMPLOYED  Tobacco Use   Smoking status: Some Days    Types: Cigarettes, Cigars   Smokeless tobacco: Former    Types: Snuff    Quit date: 04/12/2018   Tobacco comments:    using snuff x 25 years  Vaping Use   Vaping Use: Never used  Substance and Sexual Activity   Alcohol use: Yes    Alcohol/week: 0.0 standard drinks of alcohol    Comment: rare   Drug use: No   Sexual activity: Not on file  Other Topics Concern   Not on file  Social History Narrative   Married, 1 daughter.   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   No tob.   Occ alcohol.   Social Determinants of Health   Financial Resource Strain: Low Risk  (04/16/2022)   Overall Financial Resource Strain (CARDIA)    Difficulty of Paying Living Expenses: Not hard at all  Food Insecurity: No Food Insecurity (04/16/2022)   Hunger Vital Sign    Worried About Running Out of Food  in the Last Year: Never true    Ran Out of Food in the Last Year: Never true  Transportation Needs: No Transportation Needs (04/16/2022)   PRAPARE - Administrator, Civil Service (Medical): No    Lack of Transportation (Non-Medical): No  Physical Activity: Inactive (04/16/2022)   Exercise Vital Sign    Days of Exercise per Week: 0 days    Minutes of  Exercise per Session: 0 min  Stress: No Stress Concern Present (04/16/2022)   [employer] of Occupational Health - Occupational Stress Questionnaire    Feeling of Stress : Not at all  Social Connections: Moderately Integrated (04/16/2022)   Social Connection and Isolation Panel [NHANES]    Frequency of Communication with Friends and Family: More than three times a week    Frequency of Social Gatherings with Friends and Family: Once a week    Attends Religious Services: More than 4 times per year    Active Member of [employer] or Organizations: No    Attends Banker Meetings: Never    Marital Status: Married  Catering manager Violence: Not At Risk (04/16/2022)   Humiliation, Afraid, Rape, and Kick questionnaire    Fear of Current or Ex-Partner: No    Emotionally Abused: No    Physically Abused: No    Sexually Abused: No    Outpatient Medications Prior to Visit  Medication Sig Dispense Refill   amphetamine-dextroamphetamine (ADDERALL XR) 30 MG 24 hr capsule 2 tabs po qAM 60 capsule 0   b complex vitamins capsule Take 1 capsule by mouth daily.     celecoxib (CELEBREX) 200 MG capsule Take 1 capsule (200 mg total) by mouth daily. 90 capsule 3   gabapentin (NEURONTIN) 600 MG tablet Take 600 mg by mouth 2 (two) times daily.     Magnesium 500 MG TABS Take 500 mg by mouth daily.     meclizine (ANTIVERT) 25 MG tablet TAKE 1 TABLET BY MOUTH THREE TIMES A DAY AS NEEDED FOR DIZZINESS 30 tablet 3   Melatonin 3 MG CAPS Take by mouth.     Multiple Vitamin (MULTIVITAMIN) tablet Take 1 tablet by mouth daily.     rOPINIRole (REQUIP) 1  MG tablet TAKE 5 TABS PO QHS 450 tablet 1   rosuvastatin (CRESTOR) 10 MG tablet TAKE 1 TABLET BY MOUTH EVERY DAY 90 tablet 3   sertraline (ZOLOFT) 50 MG tablet Take 50 mg by mouth.     tadalafil (CIALIS) 5 MG tablet Take 5 mg by mouth daily.     tamsulosin (FLOMAX) 0.4 MG CAPS capsule Take 0.4 mg by mouth daily.     TART CHERRY PO Take by mouth daily.     traZODone (DESYREL) 50 MG tablet TAKE 1 TO 2 TABLETS BY MOUTH EVERY DAY AT BEDTIME AS NEEDED FOR INSOMNIA 180 tablet 1   valsartan (DIOVAN) 80 MG tablet TAKE 1 TABLET BY MOUTH EVERY DAY 90 tablet 0   VITAMIN D PO Take 50,000 Units by mouth 2 (two) times daily.     No facility-administered medications prior to visit.    Allergies  Allergen Reactions   Methylphenidate Palpitations    Pt reported; confirmed he wanted med added to his allergy list   Other     Narcotics;   Nightmares, hallucinations   Oxycodone Itching and Other (See Comments)    Nightmares, hallucinations   Penicillins Other (See Comments)    Unknown childhood reaction    Review of Systems *** PE;    04/30/2022   11:57 AM 04/30/2022   10:08 AM 04/08/2022    2:44 PM  Vitals with BMI  Weight   230 lbs  Systolic 152 141   Diastolic 76 83   Pulse 69 80      *** Pertinent labs:  *** ASSESSMENT AND PLAN:   No problem-specific Assessment & Plan notes found for this encounter.  Health maintenance exam: Reviewed age and  gender appropriate health maintenance issues (prudent diet, regular exercise, health risks of tobacco and excessive alcohol, use of seatbelts, fire alarms in home, use of sunscreen).  Also reviewed age and gender appropriate health screening as well as vaccine recommendations. Vaccines: Labs: Prostate ca screening:  Colon ca screening: due for recall this year.  An After Visit Summary was printed and given to the patient.  FOLLOW UP:  No follow-ups on file.  Signed:  Santiago Bumpers, MD           08/24/2022

## 2022-09-02 ENCOUNTER — Encounter: Payer: Self-pay | Admitting: Family Medicine

## 2022-09-14 ENCOUNTER — Telehealth: Payer: Self-pay

## 2022-09-14 NOTE — Telephone Encounter (Signed)
Prescription Request  09/14/2022  LOV: Visit date not found  What is the name of the medication or equipment? amphetamine-dextroamphetamine (ADDERALL XR) 30 MG 24 hr capsule   Have you contacted your pharmacy to request a refill? No  Patient is leaving to go out of country on Wednesday morning.  Patient aware Dr. Milinda Cave out of office until 7/9  Which pharmacy would you like this sent to?  [id number] - [place], [place] - [address] [place] [address] Phone: [phone] Fax: [phone]    Patient notified that their request is being sent to the clinical staff for review and that they should receive a response within 2 business days.   Please advise at Mobile [phone] (mobile)

## 2022-09-15 MED ORDER — AMPHETAMINE-DEXTROAMPHET ER 30 MG PO CP24: ORAL_CAPSULE | ORAL | 0 refills | Status: DC

## 2022-09-15 NOTE — Telephone Encounter (Signed)
Rx sent 

## 2022-10-14 ENCOUNTER — Encounter: Payer: Self-pay | Admitting: Family Medicine

## 2022-10-14 ENCOUNTER — Ambulatory Visit (INDEPENDENT_AMBULATORY_CARE_PROVIDER_SITE_OTHER): Payer: PPO | Admitting: Family Medicine

## 2022-10-14 VITALS — BP 129/73 | HR 62 | Wt 231.4 lb

## 2022-10-14 DIAGNOSIS — R42 Dizziness and giddiness: Secondary | ICD-10-CM

## 2022-10-14 DIAGNOSIS — E878 Other disorders of electrolyte and fluid balance, not elsewhere classified: Secondary | ICD-10-CM

## 2022-10-14 MED ORDER — SCOPOLAMINE 1 MG/3DAYS TD PT72: 1.0000 | MEDICATED_PATCH | TRANSDERMAL | 1 refills | Status: DC

## 2022-10-14 NOTE — Progress Notes (Signed)
OFFICE VISIT  10/14/2022  CC:  Chief Complaint  Patient presents with   Dizziness    He has been having worsening dizzy spells which is effecting his balance. He denies headaches.    Patient is a 77 y.o. male who presents for ongoing problems with chronic episodic vertigo. I last saw him on 04/08/2022. A/P as of that visit: "Chronic/recurrent vertigo.  Question central vertigo. Also question vertebrobasilar insufficiency. Intermittently he has ataxia as well as double vision associated with his episodes. Seems to be progressive over the last 3 months. I want to get MRI brain without contrast and MR angio head. I think it is okay for him to restart meclizine since this had been helping in the past and gave him a decent measure of functionality. Also, I recommend he restart vestibular rehab.  He will give [place] a call if he needs a new referral."  INTERIM HX: Andrew Stevenson has had a disequilibrium syndrome for years. For at least the last 6 months it has bothered him constantly to the point where he hardly drives and hardly leaves the house.  He feels "drunk" when sitting and when getting up and trying to walk.  He does have episodes in which he describes more of a vertiginous feeling but notes no particular trigger for this worsening.  He does not associate his symptoms with rapid head movements or rolling over or with postural changes. No palpitations, no headaches, no visual abnormalities, no hearing abnormalities.  No change in level of consciousness, no confusion. He has had ENT and neurology evaluations in the past.  Head imaging multiple times have been unremarkable. Recently, MRI brain without contrast done on 06/08/2022, ordered by his neurosurgeon Dr. Yetta Barre "no acute or reversible finding.  Mild age-related volume loss.  Minimal chronic small vessel ischemic change of the cerebral hemispheric white matter, less then often seen at this age.  Also noted was chronic osteoarthritis of the C1-2  articulation with a joint effusion on the left".  Review of systems: No focal weakness, no tremor, no paresthesias.  He does have chronic decreased sensation on his feet.  He has chronic neck pain with a known diagnosis of cervical spondylosis.  No recent falls, no fevers, no rashes.  Past Medical History:  Diagnosis Date   Adult ADHD    adderall caused elevated bp   Anxiety and depression    in past/ due to back injuries   Benign paroxysmal positional vertigo    Branch retinal artery occlusion of right eye    COVID-19 virus infection 09/2020   Disequilibrium syndrome    Carotid dopplers and MRI brain NORMAL x 2 (including eighth nerve studies on most recent MRI 08/2016).   Erectile dysfunction    Failed back surgical syndrome    Dr. Marikay Alar 04/16/20->ref to pain mgmt-> SPINAL CORD STIMULATOR helpful (2022)   Gross hematuria    passed a stone prior to the planned CT 10/2020   History of concussion    june 2015--  hit head w/ syncope---  no residual   History of memory loss    per prior PCP records./ per pt due to medication   History of syncope    june 2015 --syncope w/ fall and pt states has happened 5 more times , the last one being jan 2016,,  states had battery of test and unknown cause   Hx of adenomatous colonic polyps 05/25/2017   Recall 05/2022   Hyperlipidemia 02/2016   atorva 02/2016---lipid #s worse so  changed to generic crestor.  Much improved on crestor.   IFG (impaired fasting glucose) 02/2016   HbA1c 5 %.   Insomnia    Moderate aortic stenosis    Plan repeat echo 03/2022   Nephrolithiasis    passed stone 10/2020   OAB (overactive bladder)    Osteoarthritis, multiple sites    C-spine, L spine, knees, "all my joints basically"   Prostate cancer [facility]) urologist-  dr ottelin/  oncologist-  dr Dayton Scrape   2016 Stage T1c,  Gleason 4+3,  PSA 5.26, vol 49.19cc.  No biochemical recurrence as of 11/2020.   RLS (restless legs syndrome)    Spinal stenosis, lumbar region with  neurogenic claudication    s/p L3-S1 fusion revision 09/2018. Pain worse 05/2019->MRI showed ankylosis + surg fusion, L2-3 being the only mobile level of lower spine. SPINAL CORD STIMULATOR->helpful.   Thrombocytopenia (HCC)    >100K.  Mild, chronic   Upper airway cough syndrome 2018   Dr. Craige Cotta.  Cough therapy maximized at f/u 07/2016; referred to ENT by Pulm.  Allergy testing OK. Improved as of 08/05/16 pulm f/u.   Vertebrobasilar insufficiency    per neurologist note dr Epimenio Foot-- this is possible causing syncope-- no tx just be careful getting up from sitting position   White coat syndrome without hypertension     Past Surgical History:  Procedure Laterality Date   Carotid Dopplers  08/01/2014   NORMAL (Dr. Epimenio Foot)   CERVICAL FUSION  last one 2010   2 times   COLONOSCOPY  02/2004; 05/2017   2019: adenomatous polyp, int hem, diverticulosis, radiation proctitis.  Recall 05/2022.   EYE SURGERY Bilateral 2013   5 right eye, 3 left eye surgeries (including cataract extraction's iol w/ lens implant, other surgery's for post-op lens fungal infection and floaters)   GOLD SEED IMPLANT N/A 02/22/2015   Procedure: GOLD SEED IMPLANT;  Surgeon: Ihor Gully, MD;  Location: [facility];  Service: Urology;  Laterality: N/A;   LEFT THIGH QUADRICEP MUSCLE BIOPSY'S  12/31/2005   LUMBAR FUSION  x2  last one 2010   4 surgeries in back per pt   MRA/MRI w/out contrast--brain  12/2014   mild, age-related chronic microvascular ischemic change (Dr. Epimenio Foot).   RADIOACTIVE SEED IMPLANT N/A 05/17/2015   Procedure: RADIOACTIVE SEED IMPLANT/BRACHYTHERAPY IMPLANT;  Surgeon: Ihor Gully, MD;  Location: [facility];  Service: Urology;  Laterality: N/A;   SPINAL CORD STIMULATOR IMPLANT     Spinal cord stimulator implantation  06/2020   HELPS!   TRANSRECTAL ULTRASOUND N/A 11/12/2014   Procedure: TRANSRECTAL ULTRASOUND AND BIOPSY  ;  Surgeon: Ihor Gully, MD;  Location: [facility];  Service: Urology;  Laterality: N/A;   TRANSTHORACIC ECHOCARDIOGRAM  03/29/2014   mild concentric LVH/  ef 55-60%/  grd I DD. AV poorly visualized, possibly bicupid;  mild AS, valve area 1.78cm^2, mean grandient 16mm Hg/  trivial TR.  03/21/21: EF 60-65%, mod aortic stenosis (mean 21, peak 35, area 1.14 cm2), aortic valve is tricuspid,, mild MR, nl LV syst fxn, grd I DD.    Outpatient Medications Prior to Visit  Medication Sig Dispense Refill   amphetamine-dextroamphetamine (ADDERALL XR) 30 MG 24 hr capsule 2 tabs po qAM 60 capsule 0   b complex vitamins capsule Take 1 capsule by mouth daily.     celecoxib (CELEBREX) 200 MG capsule Take 1 capsule (200 mg total) by mouth daily. 90 capsule 3   gabapentin (NEURONTIN) 600 MG tablet Take  600 mg by mouth 2 (two) times daily.     Magnesium 500 MG TABS Take 500 mg by mouth daily.     Melatonin 3 MG CAPS Take by mouth.     Multiple Vitamin (MULTIVITAMIN) tablet Take 1 tablet by mouth daily.     rOPINIRole (REQUIP) 1 MG tablet TAKE 5 TABS PO QHS 450 tablet 1   rosuvastatin (CRESTOR) 10 MG tablet TAKE 1 TABLET BY MOUTH EVERY DAY 90 tablet 3   sertraline (ZOLOFT) 50 MG tablet Take 50 mg by mouth.     tadalafil (CIALIS) 5 MG tablet Take 5 mg by mouth daily.     tamsulosin (FLOMAX) 0.4 MG CAPS capsule Take 0.4 mg by mouth daily.     TART CHERRY PO Take by mouth daily.     traZODone (DESYREL) 50 MG tablet TAKE 1 TO 2 TABLETS BY MOUTH EVERY DAY AT BEDTIME AS NEEDED FOR INSOMNIA 180 tablet 1   valsartan (DIOVAN) 80 MG tablet TAKE 1 TABLET BY MOUTH EVERY DAY 90 tablet 0   VITAMIN D PO Take 50,000 Units by mouth 2 (two) times daily.     meclizine (ANTIVERT) 25 MG tablet TAKE 1 TABLET BY MOUTH THREE TIMES A DAY AS NEEDED FOR DIZZINESS 30 tablet 3   No facility-administered medications prior to visit.    Allergies  Allergen Reactions   Methylphenidate Palpitations    Pt reported; confirmed he wanted med added to his allergy list   Other      Narcotics;   Nightmares, hallucinations   Oxycodone Itching and Other (See Comments)    Nightmares, hallucinations   Penicillins Other (See Comments)    Unknown childhood reaction    Review of Systems As per HPI  PE:    10/14/2022   10:44 AM 04/30/2022   11:57 AM 04/30/2022   10:08 AM  Vitals with BMI  Weight 231 lbs 6 oz    Systolic 129 152 578  Diastolic 73 76 83  Pulse 62 69 80     Physical Exam  Gen: Alert, well appearing.  Patient is oriented to person, place, time, and situation. Affectn Neuro: CN 2-12 intact bilaterally, strength 5/5 in proximal and distal upper extremities and lower extremities bilaterally.    No tremor.  Finger-nose-finger and heel shin toe normal bilaterally.  He walks slowly, fairly small steps but no shuffling.  Unable to elicit any DTRs on upper extremities or lower extremities bilaterally.  No clonus.  No pronator drift.  LABS:  Last CBC Lab Results  Component Value Date   WBC 3.8 (L) 02/20/2022   HGB 14.9 02/20/2022   HCT 45.4 02/20/2022   MCV 92.3 02/20/2022   MCH 30.0 09/03/2020   RDW 13.8 02/20/2022   PLT 99.0 (L) 02/20/2022   Last metabolic panel Lab Results  Component Value Date   GLUCOSE 116 (H) 02/20/2022   NA 141 02/20/2022   K 4.9 02/20/2022   CL 105 02/20/2022   CO2 34 (H) 02/20/2022   BUN 15 02/20/2022   CREATININE 0.97 02/20/2022   GFR 75.11 02/20/2022   CALCIUM 9.4 02/20/2022   PROT 6.4 02/20/2022   ALBUMIN 4.3 02/20/2022   LABGLOB 2.7 07/06/2014   AGRATIO 1.4 07/06/2014   BILITOT 0.9 02/20/2022   ALKPHOS 59 02/20/2022   AST 24 02/20/2022   ALT 22 02/20/2022   ANIONGAP 6 09/03/2020   IMPRESSION AND PLAN:  Chronic disequilibrium syndrome. Unknown etiology. Imaging and specialist evaluation in the past have not revealed a  cause. Meclizine no help.  Will ask [facility] neurology to see him for another opinion. In the meantime, stop meclizine. We discussed scopolamine trial and he was in favor--> prescribed  today.  An After Visit Summary was printed and given to the patient.  FOLLOW UP: Return in about 2 months (around 12/14/2022) for f/u add.  Signed:  Santiago Bumpers, MD           10/14/2022

## 2022-10-14 NOTE — Patient Instructions (Signed)
Do not take meclizine anymore.

## 2022-10-15 DIAGNOSIS — F411 Generalized anxiety disorder: Secondary | ICD-10-CM | POA: Diagnosis not present

## 2022-10-20 ENCOUNTER — Encounter: Payer: Self-pay | Admitting: Family Medicine

## 2022-10-29 ENCOUNTER — Encounter (INDEPENDENT_AMBULATORY_CARE_PROVIDER_SITE_OTHER): Payer: Self-pay

## 2022-11-29 ENCOUNTER — Other Ambulatory Visit: Payer: Self-pay | Admitting: Family Medicine

## 2022-12-10 ENCOUNTER — Other Ambulatory Visit: Payer: Self-pay | Admitting: Family Medicine

## 2022-12-10 DIAGNOSIS — F411 Generalized anxiety disorder: Secondary | ICD-10-CM | POA: Diagnosis not present

## 2022-12-10 NOTE — Patient Instructions (Addendum)
Check your blood pressure and heart rate once a day and write these numbers down. Bring these to your next office visit in 2 weeks.

## 2022-12-14 ENCOUNTER — Encounter: Payer: Self-pay | Admitting: Family Medicine

## 2022-12-14 ENCOUNTER — Ambulatory Visit (INDEPENDENT_AMBULATORY_CARE_PROVIDER_SITE_OTHER): Payer: PPO | Admitting: Family Medicine

## 2022-12-14 VITALS — BP 130/78 | HR 70 | Temp 98.1°F | Ht 75.0 in | Wt 232.2 lb

## 2022-12-14 DIAGNOSIS — I10 Essential (primary) hypertension: Secondary | ICD-10-CM

## 2022-12-14 DIAGNOSIS — Z79899 Other long term (current) drug therapy: Secondary | ICD-10-CM | POA: Diagnosis not present

## 2022-12-14 DIAGNOSIS — F988 Other specified behavioral and emotional disorders with onset usually occurring in childhood and adolescence: Secondary | ICD-10-CM | POA: Diagnosis not present

## 2022-12-14 DIAGNOSIS — Z23 Encounter for immunization: Secondary | ICD-10-CM

## 2022-12-14 MED ORDER — AMPHETAMINE-DEXTROAMPHET ER 30 MG PO CP24: ORAL_CAPSULE | ORAL | 0 refills | Status: DC

## 2022-12-14 MED ORDER — VALSARTAN 80 MG PO TABS: ORAL_TABLET | ORAL | 1 refills | Status: DC

## 2022-12-14 MED ORDER — ROPINIROLE HCL 1 MG PO TABS: ORAL_TABLET | ORAL | 1 refills | Status: DC

## 2022-12-14 MED ORDER — TRAZODONE HCL 50 MG PO TABS: ORAL_TABLET | ORAL | 1 refills | Status: DC

## 2022-12-14 NOTE — Progress Notes (Signed)
OFFICE VISIT  12/14/2022  CC:  Chief Complaint  Patient presents with   Medical Management of Chronic Issues    Patient is a 77 y.o. male who presents for 2 month follow-up with ADD and chronic disequilibrium syndrome. A/P as of last visit: "Chronic disequilibrium syndrome. Unknown etiology. Imaging and specialist evaluation in the past have not revealed a cause. Meclizine no help.   Will ask [facility] neurology to see him for another opinion. In the meantime, stop meclizine. We discussed scopolamine trial and he was in favor--> prescribed today."  INTERIM HX: Chronic dizziness unchanged. He is not falling. He has not heard from [facility] neurology about setting up an appointment yet.  He monitors his blood pressure at home some and said it is typically 130-150 systolic over 70s to 80s diastolic.  Pt states all is going well with the med at current dosing (Adderall XR 30 mg, 2 tabs a day): much improved focus, concentration, task completion.  Less frustration, better multitasking, less impulsivity and restlessness.  Mood is stable. No side effects from the medication.  He is seeing a psychiatrist for some anxiety and depression problems and feels like this is going well.  PMP AWARE reviewed today: most recent rx for Adderall XR 30 mg was filled 09/15/22, # 60, rx by me. No red flags.  ROS as above, plus--> chronic urinary urgency.  Urinary frequency. No fevers, no CP, no SOB, no wheezing, no cough, no HAs, no rashes, no melena/hematochezia.  No polyuria or polydipsia.  No myalgias or arthralgias.  No focal weakness, paresthesias, or tremors.  No acute vision or hearing abnormalities.  No recent changes in lower legs. No n/v/d or abd pain.  No palpitations.    Past Medical History:  Diagnosis Date   Adult ADHD    adderall caused elevated bp   Anxiety and depression    in past/ due to back injuries   Benign paroxysmal positional vertigo    Branch retinal artery occlusion of right eye     COVID-19 virus infection 09/2020   Disequilibrium syndrome    Carotid dopplers and MRI brain NORMAL x 2 (including eighth nerve studies on most recent MRI 08/2016).   Erectile dysfunction    Failed back surgical syndrome    Dr. Marikay Alar 04/16/20->ref to pain mgmt-> SPINAL CORD STIMULATOR helpful (2022)   Gross hematuria    passed a stone prior to the planned CT 10/2020   History of concussion    june 2015--  hit head w/ syncope---  no residual   History of memory loss    per prior PCP records./ per pt due to medication   History of syncope    june 2015 --syncope w/ fall and pt states has happened 5 more times , the last one being jan 2016,,  states had battery of test and unknown cause   Hx of adenomatous colonic polyps 05/25/2017   Recall 05/2022   Hyperlipidemia 02/2016   atorva 02/2016---lipid #s worse so changed to generic crestor.  Much improved on crestor.   IFG (impaired fasting glucose) 02/2016   HbA1c 5 %.   Insomnia    Moderate aortic stenosis    Plan repeat echo 03/2022   Nephrolithiasis    passed stone 10/2020   OAB (overactive bladder)    Osteoarthritis, multiple sites    C-spine, L spine, knees, "all my joints basically"   Prostate cancer [facility]) urologist-  dr ottelin/  oncologist-  dr Dayton Scrape   2016 Stage T1c,  Gleason 4+3,  PSA 5.26, vol 49.19cc.  No biochemical recurrence as of 11/2020.   RLS (restless legs syndrome)    Spinal stenosis, lumbar region with neurogenic claudication    s/p L3-S1 fusion revision 09/2018. Pain worse 05/2019->MRI showed ankylosis + surg fusion, L2-3 being the only mobile level of lower spine. SPINAL CORD STIMULATOR->helpful.   Thrombocytopenia (HCC)    >100K.  Mild, chronic   Upper airway cough syndrome 2018   Dr. Craige Cotta.  Cough therapy maximized at f/u 07/2016; referred to ENT by Pulm.  Allergy testing OK. Improved as of 08/05/16 pulm f/u.   Vertebrobasilar insufficiency    per neurologist note dr Epimenio Foot-- this is possible causing syncope-- no  tx just be careful getting up from sitting position   White coat syndrome without hypertension     Past Surgical History:  Procedure Laterality Date   Carotid Dopplers  08/01/2014   NORMAL (Dr. Epimenio Foot)   CERVICAL FUSION  last one 2010   2 times   COLONOSCOPY  02/2004; 05/2017   2019: adenomatous polyp, int hem, diverticulosis, radiation proctitis.  Recall 05/2022.   EYE SURGERY Bilateral 2013   5 right eye, 3 left eye surgeries (including cataract extraction's iol w/ lens implant, other surgery's for post-op lens fungal infection and floaters)   GOLD SEED IMPLANT N/A 02/22/2015   Procedure: GOLD SEED IMPLANT;  Surgeon: Ihor Gully, MD;  Location: [facility];  Service: Urology;  Laterality: N/A;   LEFT THIGH QUADRICEP MUSCLE BIOPSY'S  12/31/2005   LUMBAR FUSION  x2  last one 2010   4 surgeries in back per pt   MRA/MRI w/out contrast--brain  12/2014   mild, age-related chronic microvascular ischemic change (Dr. Epimenio Foot).   RADIOACTIVE SEED IMPLANT N/A 05/17/2015   Procedure: RADIOACTIVE SEED IMPLANT/BRACHYTHERAPY IMPLANT;  Surgeon: Ihor Gully, MD;  Location: [facility];  Service: Urology;  Laterality: N/A;   SPINAL CORD STIMULATOR IMPLANT     Spinal cord stimulator implantation  06/2020   HELPS!   TRANSRECTAL ULTRASOUND N/A 11/12/2014   Procedure: TRANSRECTAL ULTRASOUND AND BIOPSY  ;  Surgeon: Ihor Gully, MD;  Location: [facility];  Service: Urology;  Laterality: N/A;   TRANSTHORACIC ECHOCARDIOGRAM  03/29/2014   mild concentric LVH/  ef 55-60%/  grd I DD. AV poorly visualized, possibly bicupid;  mild AS, valve area 1.78cm^2, mean grandient 16mm Hg/  trivial TR.  03/21/21: EF 60-65%, mod aortic stenosis (mean 21, peak 35, area 1.14 cm2), aortic valve is tricuspid,, mild MR, nl LV syst fxn, grd I DD.    Outpatient Medications Prior to Visit  Medication Sig Dispense Refill   b complex vitamins capsule Take 1 capsule by mouth daily.      celecoxib (CELEBREX) 200 MG capsule Take 1 capsule (200 mg total) by mouth daily. 90 capsule 3   gabapentin (NEURONTIN) 600 MG tablet Take 600 mg by mouth 2 (two) times daily.     Magnesium 500 MG TABS Take 500 mg by mouth daily.     Melatonin 3 MG CAPS Take by mouth.     Multiple Vitamin (MULTIVITAMIN) tablet Take 1 tablet by mouth daily.     rosuvastatin (CRESTOR) 10 MG tablet TAKE 1 TABLET BY MOUTH EVERY DAY 90 tablet 3   scopolamine (TRANSDERM-SCOP) 1 MG/3DAYS Place 1 patch (1.5 mg total) onto the skin every 3 (three) days. 4 patch 1   sertraline (ZOLOFT) 50 MG tablet Take 50 mg by mouth.     tadalafil (  CIALIS) 20 MG tablet Take 10 mg by mouth daily.     tadalafil (CIALIS) 5 MG tablet Take 5 mg by mouth daily.     tamsulosin (FLOMAX) 0.4 MG CAPS capsule Take 0.4 mg by mouth daily.     TART CHERRY PO Take by mouth daily.     traZODone (DESYREL) 150 MG tablet Take 150 mg by mouth at bedtime as needed.     VITAMIN D PO Take 50,000 Units by mouth 2 (two) times daily.     valsartan (DIOVAN) 80 MG tablet TAKE 1 TABLET BY MOUTH EVERY DAY 90 tablet 0   amphetamine-dextroamphetamine (ADDERALL XR) 30 MG 24 hr capsule 2 tabs po qAM 60 capsule 0   rOPINIRole (REQUIP) 1 MG tablet TAKE 5 TABLETS BY MOUTH AT BEDTIME. MUST KEEP APPT FOR FURTHER REFILLS 70 tablet 0   sertraline (ZOLOFT) 100 MG tablet Take 200 mg by mouth every morning. (Patient not taking: Reported on 12/14/2022)     traZODone (DESYREL) 50 MG tablet TAKE 1 TO 2 TABLETS BY MOUTH EVERY DAY AT BEDTIME AS NEEDED FOR INSOMNIA (Patient not taking: Reported on 12/14/2022) 180 tablet 1   No facility-administered medications prior to visit.    Allergies  Allergen Reactions   Methylphenidate Palpitations    Pt reported; confirmed he wanted med added to his allergy list   Other     Narcotics;   Nightmares, hallucinations   Oxycodone Itching and Other (See Comments)    Nightmares, hallucinations   Penicillins Other (See Comments)    Unknown  childhood reaction    Review of Systems As per HPI  PE:    12/14/2022    1:34 PM 12/14/2022    1:19 PM 10/14/2022   10:44 AM  Vitals with BMI  Height  6\' 3"    Weight  232 lbs 3 oz 231 lbs 6 oz  BMI  29.02   Systolic 130 159 782  Diastolic 78 76 73  Pulse  70 62     Physical Exam  Gen: Alert, well appearing.  Patient is oriented to person, place, time, and situation. AFFECT: pleasant, lucid thought and speech.   LABS:  Last CBC Lab Results  Component Value Date   WBC 3.8 (L) 02/20/2022   HGB 14.9 02/20/2022   HCT 45.4 02/20/2022   MCV 92.3 02/20/2022   MCH 30.0 09/03/2020   RDW 13.8 02/20/2022   PLT 99.0 (L) 02/20/2022   Last metabolic panel Lab Results  Component Value Date   GLUCOSE 116 (H) 02/20/2022   NA 141 02/20/2022   K 4.9 02/20/2022   CL 105 02/20/2022   CO2 34 (H) 02/20/2022   BUN 15 02/20/2022   CREATININE 0.97 02/20/2022   GFR 75.11 02/20/2022   CALCIUM 9.4 02/20/2022   PROT 6.4 02/20/2022   ALBUMIN 4.3 02/20/2022   LABGLOB 2.7 07/06/2014   AGRATIO 1.4 07/06/2014   BILITOT 0.9 02/20/2022   ALKPHOS 59 02/20/2022   AST 24 02/20/2022   ALT 22 02/20/2022   ANIONGAP 6 09/03/2020   Last lipids Lab Results  Component Value Date   CHOL 132 02/20/2022   HDL 59.80 02/20/2022   LDLCALC 54 02/20/2022   LDLDIRECT 98.0 08/20/2016   TRIG 89.0 02/20/2022   CHOLHDL 2 02/20/2022   Last hemoglobin A1c Lab Results  Component Value Date   HGBA1C 5.0 02/27/2016   Last thyroid functions Lab Results  Component Value Date   TSH 2.00 02/27/2016   Lab Results  Component Value Date  HGBA1C 5.0 02/27/2016   IMPRESSION AND PLAN:  #1 adult ADD, doing well long-term on Adderall XR 30 mg, 2 tabs p.o. every morning. #60 prescribed today. Urine drug screen today.  Controlled substance contract up-to-date.  2.  Hypertension, not well-controlled. Increase valsartan 80 mg tab to 1-1/2 tabs daily. Electrolytes and creatinine monitoring today.  3.   Chronic disequilibrium syndrome. Unchanged.  Pretty debilitating for him. We are in the midst of trying to get him referred to [facility] neurology for another opinion.   An After Visit Summary was printed and given to the patient.  FOLLOW UP: Return in about 2 weeks (around 12/28/2022) for f/u HTN.  Signed:  Santiago Bumpers, MD           12/14/2022

## 2022-12-15 LAB — BASIC METABOLIC PANEL
BUN: 20 mg/dL (ref 6–23)
CO2: 30 meq/L (ref 19–32)
Calcium: 9.1 mg/dL (ref 8.4–10.5)
Chloride: 105 meq/L (ref 96–112)
Creatinine, Ser: 1.12 mg/dL (ref 0.40–1.50)
GFR: 62.85 mL/min (ref >60.00)
Glucose, Bld: 96 mg/dL (ref 70–99)
Potassium: 4.8 meq/L (ref 3.5–5.1)
Sodium: 142 meq/L (ref 135–145)

## 2022-12-18 LAB — DM TEMPLATE

## 2022-12-18 LAB — DRUG MONITORING PANEL 376104, URINE
Amphetamines: NEGATIVE ng/mL (ref <500)
Barbiturates: NEGATIVE ng/mL (ref <300)
Benzodiazepines: NEGATIVE ng/mL (ref <100)
Cocaine Metabolite: NEGATIVE ng/mL (ref <150)
Desmethyltramadol: NEGATIVE ng/mL (ref <100)
Opiates: NEGATIVE ng/mL (ref <100)
Oxycodone: NEGATIVE ng/mL (ref <100)
Tramadol: NEGATIVE ng/mL (ref <100)

## 2022-12-28 ENCOUNTER — Encounter: Payer: Self-pay | Admitting: Family Medicine

## 2022-12-28 ENCOUNTER — Ambulatory Visit: Payer: PPO | Admitting: Family Medicine

## 2022-12-28 NOTE — Progress Notes (Deleted)
OFFICE VISIT  12/28/2022  CC: No chief complaint on file.   Patient is a 77 y.o. male who presents for 2-week follow-up hypertension. A/P as of last visit: "1 adult ADD, doing well long-term on Adderall XR 30 mg, 2 tabs p.o. every morning. #60 prescribed today. Urine drug screen today.  Controlled substance contract up-to-date.   2.  Hypertension, not well-controlled. Increase valsartan 80 mg tab to 1-1/2 tabs daily. Electrolytes and creatinine monitoring today.   3.  Chronic disequilibrium syndrome. Unchanged.  Pretty debilitating for him. We are in the midst of trying to get him referred to [facility] neurology for another opinion."  INTERIM HX: ***   Past Medical History:  Diagnosis Date   Adult ADHD    adderall caused elevated bp   Anxiety and depression    in past/ due to back injuries   Benign paroxysmal positional vertigo    Branch retinal artery occlusion of right eye    COVID-19 virus infection 09/2020   Disequilibrium syndrome    Carotid dopplers and MRI brain NORMAL x 2 (including eighth nerve studies on most recent MRI 08/2016).   Erectile dysfunction    Failed back surgical syndrome    Dr. Marikay Alar 04/16/20->ref to pain mgmt-> SPINAL CORD STIMULATOR helpful (2022)   Gross hematuria    passed a stone prior to the planned CT 10/2020   History of concussion    june 2015--  hit head w/ syncope---  no residual   History of memory loss    per prior PCP records./ per pt due to medication   History of syncope    june 2015 --syncope w/ fall and pt states has happened 5 more times , the last one being jan 2016,,  states had battery of test and unknown cause   Hx of adenomatous colonic polyps 05/25/2017   Recall 05/2022   Hyperlipidemia 02/2016   atorva 02/2016---lipid #s worse so changed to generic crestor.  Much improved on crestor.   IFG (impaired fasting glucose) 02/2016   HbA1c 5 %.   Insomnia    Moderate aortic stenosis    Plan repeat echo 03/2022    Nephrolithiasis    passed stone 10/2020   OAB (overactive bladder)    Osteoarthritis, multiple sites    C-spine, L spine, knees, "all my joints basically"   Prostate cancer [facility]) urologist-  dr ottelin/  oncologist-  dr Dayton Scrape   2016 Stage T1c,  Gleason 4+3,  PSA 5.26, vol 49.19cc.  No biochemical recurrence as of 11/2020.   RLS (restless legs syndrome)    Spinal stenosis, lumbar region with neurogenic claudication    s/p L3-S1 fusion revision 09/2018. Pain worse 05/2019->MRI showed ankylosis + surg fusion, L2-3 being the only mobile level of lower spine. SPINAL CORD STIMULATOR->helpful.   Thrombocytopenia (HCC)    >100K.  Mild, chronic   Upper airway cough syndrome 2018   Dr. Craige Cotta.  Cough therapy maximized at f/u 07/2016; referred to ENT by Pulm.  Allergy testing OK. Improved as of 08/05/16 pulm f/u.   Vertebrobasilar insufficiency    per neurologist note dr Epimenio Foot-- this is possible causing syncope-- no tx just be careful getting up from sitting position   White coat syndrome without hypertension     Past Surgical History:  Procedure Laterality Date   Carotid Dopplers  08/01/2014   NORMAL (Dr. Epimenio Foot)   CERVICAL FUSION  last one 2010   2 times   COLONOSCOPY  02/2004; 05/2017   2019: adenomatous  polyp, int hem, diverticulosis, radiation proctitis.  Recall 05/2022.   EYE SURGERY Bilateral 2013   5 right eye, 3 left eye surgeries (including cataract extraction's iol w/ lens implant, other surgery's for post-op lens fungal infection and floaters)   GOLD SEED IMPLANT N/A 02/22/2015   Procedure: GOLD SEED IMPLANT;  Surgeon: Ihor Gully, MD;  Location: [facility];  Service: Urology;  Laterality: N/A;   LEFT THIGH QUADRICEP MUSCLE BIOPSY'S  12/31/2005   LUMBAR FUSION  x2  last one 2010   4 surgeries in back per pt   MRA/MRI w/out contrast--brain  12/2014   mild, age-related chronic microvascular ischemic change (Dr. Epimenio Foot).   RADIOACTIVE SEED IMPLANT N/A 05/17/2015   Procedure:  RADIOACTIVE SEED IMPLANT/BRACHYTHERAPY IMPLANT;  Surgeon: Ihor Gully, MD;  Location: [facility] [place];  Service: Urology;  Laterality: N/A;   SPINAL CORD STIMULATOR IMPLANT     Spinal cord stimulator implantation  06/2020   HELPS!   TRANSRECTAL ULTRASOUND N/A 11/12/2014   Procedure: TRANSRECTAL ULTRASOUND AND BIOPSY  ;  Surgeon: Ihor Gully, MD;  Location: [facility];  Service: Urology;  Laterality: N/A;   TRANSTHORACIC ECHOCARDIOGRAM  03/29/2014   mild concentric LVH/  ef 55-60%/  grd I DD. AV poorly visualized, possibly bicupid;  mild AS, valve area 1.78cm^2, mean grandient 16mm Hg/  trivial TR.  03/21/21: EF 60-65%, mod aortic stenosis (mean 21, peak 35, area 1.14 cm2), aortic valve is tricuspid,, mild MR, nl LV syst fxn, grd I DD.    Outpatient Medications Prior to Visit  Medication Sig Dispense Refill   amphetamine-dextroamphetamine (ADDERALL XR) 30 MG 24 hr capsule 2 tabs po qAM 60 capsule 0   b complex vitamins capsule Take 1 capsule by mouth daily.     celecoxib (CELEBREX) 200 MG capsule Take 1 capsule (200 mg total) by mouth daily. 90 capsule 3   gabapentin (NEURONTIN) 600 MG tablet Take 600 mg by mouth 2 (two) times daily.     Magnesium 500 MG TABS Take 500 mg by mouth daily.     Melatonin 3 MG CAPS Take by mouth.     Multiple Vitamin (MULTIVITAMIN) tablet Take 1 tablet by mouth daily.     rOPINIRole (REQUIP) 1 MG tablet TAKE 5 TABLETS BY MOUTH AT BEDTIME. MUST KEEP APPT FOR FURTHER REFILLS 450 tablet 1   rosuvastatin (CRESTOR) 10 MG tablet TAKE 1 TABLET BY MOUTH EVERY DAY 90 tablet 3   scopolamine (TRANSDERM-SCOP) 1 MG/3DAYS Place 1 patch (1.5 mg total) onto the skin every 3 (three) days. 4 patch 1   sertraline (ZOLOFT) 50 MG tablet Take 50 mg by mouth.     tadalafil (CIALIS) 20 MG tablet Take 10 mg by mouth daily.     tadalafil (CIALIS) 5 MG tablet Take 5 mg by mouth daily.     tamsulosin (FLOMAX) 0.4 MG CAPS capsule Take 0.4 mg by mouth daily.      TART CHERRY PO Take by mouth daily.     traZODone (DESYREL) 150 MG tablet Take 150 mg by mouth at bedtime as needed.     traZODone (DESYREL) 50 MG tablet TAKE 1 TO 2 TABLETS BY MOUTH EVERY DAY AT BEDTIME AS NEEDED FOR INSOMNIA 180 tablet 1   valsartan (DIOVAN) 80 MG tablet 1 and 1/2 tabs po qd 1351 tablet 1   VITAMIN D PO Take 50,000 Units by mouth 2 (two) times daily.     No facility-administered medications prior to visit.  Allergies  Allergen Reactions   Methylphenidate Palpitations    Pt reported; confirmed he wanted med added to his allergy list   Other     Narcotics;   Nightmares, hallucinations   Oxycodone Itching and Other (See Comments)    Nightmares, hallucinations   Penicillins Other (See Comments)    Unknown childhood reaction    Review of Systems As per HPI  PE:    12/14/2022    1:34 PM 12/14/2022    1:19 PM 10/14/2022   10:44 AM  Vitals with BMI  Height  6\' 3"    Weight  232 lbs 3 oz 231 lbs 6 oz  BMI  29.02   Systolic 130 159 295  Diastolic 78 76 73  Pulse  70 62     Physical Exam  ***  LABS:  Last CBC Lab Results  Component Value Date   WBC 3.8 (L) 02/20/2022   HGB 14.9 02/20/2022   HCT 45.4 02/20/2022   MCV 92.3 02/20/2022   MCH 30.0 09/03/2020   RDW 13.8 02/20/2022   PLT 99.0 (L) 02/20/2022   Last metabolic panel Lab Results  Component Value Date   GLUCOSE 96 12/14/2022   NA 142 12/14/2022   K 4.8 12/14/2022   CL 105 12/14/2022   CO2 30 12/14/2022   BUN 20 12/14/2022   CREATININE 1.12 12/14/2022   GFR 62.85 12/14/2022   CALCIUM 9.1 12/14/2022   PROT 6.4 02/20/2022   ALBUMIN 4.3 02/20/2022   LABGLOB 2.7 07/06/2014   AGRATIO 1.4 07/06/2014   BILITOT 0.9 02/20/2022   ALKPHOS 59 02/20/2022   AST 24 02/20/2022   ALT 22 02/20/2022   ANIONGAP 6 09/03/2020   IMPRESSION AND PLAN:  No problem-specific Assessment & Plan notes found for this encounter.   An After Visit Summary was printed and given to the patient.  FOLLOW UP:  No follow-ups on file.  Signed:  Santiago Bumpers, MD           12/28/2022

## 2023-01-07 DIAGNOSIS — F411 Generalized anxiety disorder: Secondary | ICD-10-CM | POA: Diagnosis not present

## 2023-01-15 ENCOUNTER — Encounter: Payer: Self-pay | Admitting: Family Medicine

## 2023-01-28 DIAGNOSIS — F411 Generalized anxiety disorder: Secondary | ICD-10-CM | POA: Diagnosis not present

## 2023-02-08 ENCOUNTER — Telehealth: Payer: Self-pay

## 2023-02-08 NOTE — Telephone Encounter (Signed)
Patient leaving to go out of town for Thanksgiving at [employer].   Prescription Request  02/08/2023  LOV: Visit date not found  What is the name of the medication or equipment? amphetamine-dextroamphetamine (ADDERALL XR) 30 MG 24 hr capsule   Have you contacted your pharmacy to request a refill? No   Which pharmacy would you like this sent to?  [id number] - [place], [place] - [address] [place] [address] Phone: [phone] Fax: [phone]    Patient notified that their request is being sent to the clinical staff for review and that they should receive a response within 2 business days.   Please advise at Mobile [phone] (mobile)

## 2023-02-09 ENCOUNTER — Other Ambulatory Visit: Payer: Self-pay | Admitting: Family Medicine

## 2023-02-10 MED ORDER — AMPHETAMINE-DEXTROAMPHET ER 30 MG PO CP24: ORAL_CAPSULE | ORAL | 0 refills | Status: DC

## 2023-02-10 NOTE — Telephone Encounter (Signed)
Okay, prescription sent 

## 2023-02-17 ENCOUNTER — Telehealth: Payer: Self-pay | Admitting: Family Medicine

## 2023-02-17 MED ORDER — BENZONATATE 200 MG PO CAPS: 200.0000 mg | ORAL_CAPSULE | Freq: Three times a day (TID) | ORAL | 0 refills | Status: DC | PRN

## 2023-02-17 NOTE — Telephone Encounter (Signed)
Pls rx tessalon pearls 200 mg, 1 tid prn cough, #20, no RF.  I can't do hydrocodone cough syrup w/out seeing him ( in-person or virtual) b/c it is a controlled substance.

## 2023-02-17 NOTE — Telephone Encounter (Signed)
Patient called to request something be called in for his ongoing cough that he has had for over a week. He refused an appointment with any other provider and is only asking that something gets called in. He states this is the same type of cough he experienced with Covid and states he tested negative for that. Please advise patient.

## 2023-03-24 ENCOUNTER — Telehealth: Payer: Self-pay | Admitting: Family Medicine

## 2023-03-24 NOTE — Telephone Encounter (Signed)
 Returned call. [facility] is currently not covered by the pt's insurance. LVM to discuss with pt.

## 2023-03-24 NOTE — Telephone Encounter (Signed)
 Reason for CRM: [facility] called regarding referral if wanting more clarification can be called back at  [phone] Option 3

## 2023-03-25 DIAGNOSIS — F411 Generalized anxiety disorder: Secondary | ICD-10-CM | POA: Diagnosis not present

## 2023-03-25 NOTE — Telephone Encounter (Signed)
 LVM to discuss

## 2023-03-26 NOTE — Telephone Encounter (Signed)
 Copied from CRM [id number]. Topic: Clinical - Medication Refill >> Mar 26, 2023  2:37 PM Tiffany H wrote: Most Recent Primary Care Visit:  Provider: CANDISE ALEENE DEL  Department: [facility]  Visit Type: OFFICE VISIT  Date: 12/14/2022  Medication: ***  Has the patient contacted their pharmacy?  (Agent: If no, request that the patient contact the pharmacy for the refill. If patient does not wish to contact the pharmacy document the reason why and proceed with request.) (Agent: If yes, when and what did the pharmacy advise?)  Is this the correct pharmacy for this prescription?  If no, delete pharmacy and type the correct one.  This is the patient's preferred pharmacy:  [id number] - [place], [place] - [address] [place] [address] Phone: [phone] Fax: [phone]   Has the prescription been filled recently?   Is the patient out of the medication?   Has the patient been seen for an appointment in the last year OR does the patient have an upcoming appointment?   Can we respond through MyChart?   Agent: Please be advised that Rx refills may take up to 3 business days. We ask that you follow-up with your pharmacy.

## 2023-03-26 NOTE — Telephone Encounter (Signed)
 LVM to discuss

## 2023-03-26 NOTE — Telephone Encounter (Signed)
 Pt needs an appt

## 2023-04-05 ENCOUNTER — Other Ambulatory Visit: Payer: Self-pay | Admitting: Family Medicine

## 2023-04-08 NOTE — Telephone Encounter (Signed)
Copied and pasted CRM  Reason for CRM: Patient stated that [facility] does not accept insurance. Patient wants to know if [place] has a Neurology office and if so he wants his referral sent there. If not, patient wants referral sent to a different Neurologist that accepts insurance.

## 2023-04-08 NOTE — Telephone Encounter (Signed)
Pls work with Roanna Raider to get info about this patient's question and then re-enter new referral order. thx

## 2023-04-13 ENCOUNTER — Other Ambulatory Visit: Payer: Self-pay | Admitting: Family Medicine

## 2023-04-13 NOTE — Telephone Encounter (Unsigned)
Copied from CRM [id number]. Topic: Clinical - Medication Refill >> Apr 13, 2023  2:16 PM Deaijah H wrote: Most Recent Primary Care Visit:  Provider: Jeoffrey Massed  Department: [facility]  Visit Type: OFFICE VISIT  Date: 12/14/2022  Medication: amphetamine-dextroamphetamine (ADDERALL XR) 30 MG 24 hr capsule/rOPINIRole (REQUIP) 1 MG tablet  Has the patient contacted their pharmacy? No (Agent: If no, request that the patient contact the pharmacy for the refill. If patient does not wish to contact the pharmacy document the reason why and proceed with request.) - No , stated Dr. Milinda Cave has to call prescription is (Agent: If yes, when and what did the pharmacy advise?)  Is this the correct pharmacy for this prescription? Yes If no, delete pharmacy and type the correct one.  This is the patient's preferred pharmacy:  [id number] - [place], [place] - [address] [place] [address] Phone: [phone] Fax: [phone]   Has the prescription been filled recently? No  Is the patient out of the medication? Yes  Has the patient been seen for an appointment in the last year OR does the patient have an upcoming appointment? Yes  Can we respond through MyChart? Yes  Agent: Please be advised that Rx refills may take up to 3 business days. We ask that you follow-up with your pharmacy.

## 2023-04-14 MED ORDER — ROPINIROLE HCL 1 MG PO TABS: ORAL_TABLET | ORAL | 1 refills | Status: DC

## 2023-04-14 MED ORDER — AMPHETAMINE-DEXTROAMPHET ER 30 MG PO CP24: ORAL_CAPSULE | ORAL | 0 refills | Status: DC

## 2023-04-15 DIAGNOSIS — F411 Generalized anxiety disorder: Secondary | ICD-10-CM | POA: Diagnosis not present

## 2023-04-28 ENCOUNTER — Ambulatory Visit: Payer: PPO | Admitting: *Deleted

## 2023-04-28 DIAGNOSIS — Z0000 Encounter for general adult medical examination without abnormal findings: Secondary | ICD-10-CM

## 2023-04-28 NOTE — Progress Notes (Signed)
Subjective:   Andrew Stevenson is a 78 y.o. male who presents for Medicare Annual/Subsequent preventive examination.  Visit Complete: Virtual I connected with  Andrew Stevenson on 04/28/23 by a audio enabled telemedicine application and verified that I am speaking with the correct person using two identifiers.  Patient Location: Home  Provider Location: Home Office  I discussed the limitations of evaluation and management by telemedicine. The patient expressed understanding and agreed to proceed.  Vital Signs: Because this visit was a virtual/telehealth visit, some criteria may be missing or patient reported. Any vitals not documented were not able to be obtained and vitals that have been documented are patient reported.      Objective:    Today's Vitals   04/28/23 1356  PainSc: 10-Worst pain ever   There is no height or weight on file to calculate BMI.     04/28/2023    2:05 PM 04/16/2022    1:55 PM 09/03/2020    1:29 PM 05/04/2018    6:44 PM 06/13/2015   10:30 AM 05/17/2015    8:50 AM 02/22/2015   11:51 AM  Advanced Directives  Does Patient Have a Medical Advance Directive? No No No No No No No  Would patient like information on creating a medical advance directive? No - Patient declined No - Patient declined   No - patient declined information No - patient declined information No - patient declined information    Current Medications (verified) Outpatient Encounter Medications as of 04/28/2023  Medication Sig   Magnesium 500 MG TABS Take 500 mg by mouth daily.   Melatonin 3 MG CAPS Take by mouth.   Multiple Vitamin (MULTIVITAMIN) tablet Take 1 tablet by mouth daily.   Omega-3 Fatty Acids (FISH OIL) 300 MG CAPS Take by mouth.   rOPINIRole (REQUIP) 1 MG tablet TAKE 5 TABLETS BY MOUTH AT BEDTIME. MUST KEEP APPT FOR FURTHER REFILLS   rosuvastatin (CRESTOR) 10 MG tablet TAKE 1 TABLET BY MOUTH EVERY DAY   tadalafil (CIALIS) 20 MG tablet Take 10 mg by mouth daily.   tadalafil (CIALIS) 5  MG tablet Take 5 mg by mouth daily.   TART CHERRY PO Take by mouth daily.   traZODone (DESYREL) 150 MG tablet Take 150 mg by mouth at bedtime as needed.   traZODone (DESYREL) 50 MG tablet TAKE 1 TO 2 TABLETS BY MOUTH EVERY DAY AT BEDTIME AS NEEDED FOR INSOMNIA   valsartan (DIOVAN) 80 MG tablet 1 and 1/2 tabs po qd   VITAMIN D PO Take 50,000 Units by mouth 2 (two) times daily.   amphetamine-dextroamphetamine (ADDERALL XR) 30 MG 24 hr capsule 2 tabs po qAM   b complex vitamins capsule Take 1 capsule by mouth daily.   benzonatate (TESSALON) 200 MG capsule Take 1 capsule (200 mg total) by mouth 3 (three) times daily as needed for cough.   celecoxib (CELEBREX) 200 MG capsule Take 1 capsule (200 mg total) by mouth daily. (Patient not taking: Reported on 04/28/2023)   gabapentin (NEURONTIN) 600 MG tablet Take 600 mg by mouth 2 (two) times daily. (Patient not taking: Reported on 04/28/2023)   scopolamine (TRANSDERM-SCOP) 1 MG/3DAYS Place 1 patch (1.5 mg total) onto the skin every 3 (three) days. (Patient not taking: Reported on 04/28/2023)   sertraline (ZOLOFT) 50 MG tablet Take 50 mg by mouth. (Patient not taking: Reported on 04/28/2023)   tamsulosin (FLOMAX) 0.4 MG CAPS capsule Take 0.4 mg by mouth daily.   No facility-administered encounter medications on  file as of 04/28/2023.    Allergies (verified) Methylphenidate, Other, Oxycodone, and Penicillins   History: Past Medical History:  Diagnosis Date   Adult ADHD    adderall caused elevated bp   Anxiety and depression    in past/ due to back injuries   Benign paroxysmal positional vertigo    Branch retinal artery occlusion of right eye    COVID-19 virus infection 09/2020   Disequilibrium syndrome    Carotid dopplers and MRI brain NORMAL x 2 (including eighth nerve studies on most recent MRI 08/2016).   Erectile dysfunction    Essential hypertension    Failed back surgical syndrome    Dr. Marikay Alar 04/16/20->ref to pain mgmt-> SPINAL CORD  STIMULATOR helpful (2022)   Gross hematuria    passed a stone prior to the planned CT 10/2020   History of concussion    june 2015--  hit head w/ syncope---  no residual   History of memory loss    per prior PCP records./ per pt due to medication   History of syncope    june 2015 --syncope w/ fall and pt states has happened 5 more times , the last one being jan 2016,,  states had battery of test and unknown cause   Hx of adenomatous colonic polyps 05/25/2017   Recall 05/2022   Hyperlipidemia 02/2016   atorva 02/2016---lipid #s worse so changed to generic crestor.  Much improved on crestor.   IFG (impaired fasting glucose) 02/2016   HbA1c 5 %.   Insomnia    Moderate aortic stenosis    Plan repeat echo 03/2022   Nephrolithiasis    passed stone 10/2020   OAB (overactive bladder)    Osteoarthritis, multiple sites    C-spine, L spine, knees, "all my joints basically"   Prostate cancer [facility]) urologist-  dr ottelin/  oncologist-  dr Dayton Scrape   2016 Stage T1c,  Gleason 4+3,  PSA 5.26, vol 49.19cc.  No biochemical recurrence as of 11/2020.   RLS (restless legs syndrome)    Spinal stenosis, lumbar region with neurogenic claudication    s/p L3-S1 fusion revision 09/2018. Pain worse 05/2019->MRI showed ankylosis + surg fusion, L2-3 being the only mobile level of lower spine. SPINAL CORD STIMULATOR->helpful.   Thrombocytopenia (HCC)    >100K.  Mild, chronic   Upper airway cough syndrome 2018   Dr. Craige Cotta.  Cough therapy maximized at f/u 07/2016; referred to ENT by Pulm.  Allergy testing OK. Improved as of 08/05/16 pulm f/u.   Vertebrobasilar insufficiency    per neurologist note dr Epimenio Foot-- this is possible causing syncope-- no tx just be careful getting up from sitting position   White coat syndrome without hypertension    Past Surgical History:  Procedure Laterality Date   Carotid Dopplers  08/01/2014   NORMAL (Dr. Epimenio Foot)   CERVICAL FUSION  last one 2010   2 times   COLONOSCOPY  02/2004; 05/2017    2019: adenomatous polyp, int hem, diverticulosis, radiation proctitis.  Recall 05/2022.   EYE SURGERY Bilateral 2013   5 right eye, 3 left eye surgeries (including cataract extraction's iol w/ lens implant, other surgery's for post-op lens fungal infection and floaters)   GOLD SEED IMPLANT N/A 02/22/2015   Procedure: GOLD SEED IMPLANT;  Surgeon: Ihor Gully, MD;  Location[facility];  Service: Urology;  Laterality: N/A;   LEFT THIGH QUADRICEP MUSCLE BIOPSY'S  12/31/2005   LUMBAR FUSION  x2  last one 2010   4 surgeries  in back per pt   MRA/MRI w/out contrast--brain  12/2014   mild, age-related chronic microvascular ischemic change (Dr. Epimenio Foot).   RADIOACTIVE SEED IMPLANT N/A 05/17/2015   Procedure: RADIOACTIVE SEED IMPLANT/BRACHYTHERAPY IMPLANT;  Surgeon: Ihor Gully, MD;  Location: [facility];  Service: Urology;  Laterality: N/A;   SPINAL CORD STIMULATOR IMPLANT     Spinal cord stimulator implantation  06/2020   HELPS!   TRANSRECTAL ULTRASOUND N/A 11/12/2014   Procedure: TRANSRECTAL ULTRASOUND AND BIOPSY  ;  Surgeon: Ihor Gully, MD;  Location: [facility];  Service: Urology;  Laterality: N/A;   TRANSTHORACIC ECHOCARDIOGRAM  03/29/2014   mild concentric LVH/  ef 55-60%/  grd I DD. AV poorly visualized, possibly bicupid;  mild AS, valve area 1.78cm^2, mean grandient 16mm Hg/  trivial TR.  03/21/21: EF 60-65%, mod aortic stenosis (mean 21, peak 35, area 1.14 cm2), aortic valve is tricuspid,, mild MR, nl LV syst fxn, grd I DD.   Family History  Problem Relation Age of Onset   Hypertension Mother    Alcohol abuse Father    Arthritis Father    Lung cancer Father        smoker   Diabetes Brother    Alcohol abuse Maternal Grandfather    Stroke Maternal Grandfather    Cancer Paternal Grandmother    Alcohol abuse Paternal Grandfather    Lung cancer Brother        smoker   Social History   Socioeconomic History   Marital status: Married     Spouse name: Not on file   Number of children: Not on file   Years of education: Not on file   Highest education level: Not on file  Occupational History   Occupation: UNEMPLOYED  Tobacco Use   Smoking status: Some Days    Types: Cigarettes, Cigars   Smokeless tobacco: Former    Types: Snuff    Quit date: 04/12/2018   Tobacco comments:    using snuff x 25 years  Vaping Use   Vaping status: Never Used  Substance and Sexual Activity   Alcohol use: Yes    Alcohol/week: 0.0 standard drinks of alcohol    Comment: rare   Drug use: No   Sexual activity: Not Currently  Other Topics Concern   Not on file  Social History Narrative   Married, 1 daughter.   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   No tob.   Occ alcohol.   Social Drivers of Corporate investment banker Strain: Low Risk  (04/28/2023)   Overall Financial Resource Strain (CARDIA)    Difficulty of Paying Living Expenses: Not hard at all  Food Insecurity: No Food Insecurity (04/28/2023)   Hunger Vital Sign    Worried About Running Out of Food in the Last Year: Never true    Ran Out of Food in the Last Year: Never true  Transportation Needs: No Transportation Needs (04/28/2023)   PRAPARE - Administrator, Civil Service (Medical): No    Lack of Transportation (Non-Medical): No  Physical Activity: Inactive (04/28/2023)   Exercise Vital Sign    Days of Exercise per Week: 0 days    Minutes of Exercise per Session: 0 min  Stress: Stress Concern Present (04/28/2023)   [employer] of Occupational Health - Occupational Stress Questionnaire    Feeling of Stress : To some extent  Social Connections: Moderately Isolated (04/28/2023)   Social Connection and  Isolation Panel [NHANES]    Frequency of Communication with Friends and Family: More than three times a week    Frequency of Social Gatherings with Friends and Family: Never    Attends Religious Services: Never    Automotive engineer or Organizations: No    Attends Engineer, structural: Never    Marital Status: Married    Tobacco Counseling Ready to quit: Not Answered Counseling given: Not Answered Tobacco comments: using snuff x 25 years   Clinical Intake:  Pre-visit preparation completed: Yes  Pain : 0-10 Pain Score: 10-Worst pain ever Pain Type: Chronic pain Pain Location: Back Pain Descriptors / Indicators: Burning, Constant, Grimacing, Dull, Aching Pain Frequency: Constant     Diabetes: No  How often do you need to have someone help you when you read instructions, pamphlets, or other written materials from your doctor or pharmacy?: 1 - Never  Interpreter Needed?: No  Information entered by :: Remi Haggard LPN   Activities of Daily Living    04/28/2023    2:05 PM  In your present state of health, do you have any difficulty performing the following activities:  Hearing? 0  Vision? 0  Difficulty concentrating or making decisions? 0  Walking or climbing stairs? 1  Dressing or bathing? 0  Doing errands, shopping? 1  Preparing Food and eating ? N  Using the Toilet? N  In the past six months, have you accidently leaked urine? Y  Do you have problems with loss of bowel control? N  Managing your Medications? N  Managing your Finances? N  Housekeeping or managing your Housekeeping? N    Patient Care Team: Jeoffrey Massed, MD as PCP - General (Family Medicine) Yates Decamp, MD as Consulting Physician (Cardiology) Margaretmary Dys, MD as Consulting Physician (Radiation Oncology) Coralyn Helling, MD (Inactive) as Consulting Physician (Pulmonary Disease) Iva Boop, MD as Consulting Physician (Gastroenterology) Letta Kocher, MD as Consulting Physician (Rehabilitation) Patricia Nettle, MD as Consulting Physician (Orthopedic Surgery) Arman Bogus, MD as Consulting Physician (Neurosurgery) Crist Fat, MD as Consulting Physician (Urology) Aquilla Hacker,  PA-C as Physician Assistant (Otolaryngology) Teryl Lucy, MD as Consulting Physician (Orthopedic Surgery)  Indicate any recent Medical Services you may have received from other than [facility] providers in the past year (date may be approximate).     Assessment:   This is a routine wellness examination for Rollan.  Hearing/Vision screen Hearing Screening - Comments:: No trouble hearing Vision Screening - Comments:: Up to date [facility]   Goals Addressed             This Visit's Progress    Patient Stated       Stay alive       Depression Screen    04/28/2023    2:10 PM 12/14/2022    1:24 PM 10/14/2022   10:49 AM 04/16/2022    1:53 PM 06/06/2021    1:05 PM 01/27/2021    1:08 PM 09/11/2020   10:12 AM  PHQ 2/9 Scores  PHQ - 2 Score 3 4 3  0 0 0 0  PHQ- 9 Score 3 17 19         Fall Risk    04/28/2023    1:58 PM 12/14/2022    1:24 PM 10/14/2022   10:48 AM 04/16/2022    1:52 PM 06/06/2021    1:04 PM  Fall Risk   Falls in the past year? 1 1 1 1  0  Number falls in past yr:  1 1 0 0 0  Injury with Fall? 0 0 0 0 0  Risk for fall due to : Impaired balance/gait No Fall Risks History of fall(s);Impaired balance/gait Impaired balance/gait   Follow up Falls evaluation completed;Education provided;Falls prevention discussed Falls evaluation completed Falls evaluation completed Falls evaluation completed Falls evaluation completed    MEDICARE RISK AT HOME: Medicare Risk at Home Any stairs in or around the home?: Yes If so, are there any without handrails?: No Home free of loose throw rugs in walkways, pet beds, electrical cords, etc?: Yes Adequate lighting in your home to reduce risk of falls?: Yes Life alert?: No Use of a cane, walker or w/c?: Yes Grab bars in the bathroom?: No Shower chair or bench in shower?: No Elevated toilet seat or a handicapped toilet?: Yes  TIMED UP AND GO:  Was the test performed?  No    Cognitive Function:        04/28/2023    2:05 PM 04/16/2022    1:56  PM  6CIT Screen  What Year? 0 points 0 points  What month? 0 points 0 points  What time?  0 points  Count back from 20 0 points 0 points  Months in reverse 4 points 0 points  Repeat phrase 0 points 0 points  Total Score  0 points    Immunizations Immunization History  Administered Date(s) Administered   Fluad Quad(high Dose 65+) 12/19/2018, 01/24/2020, 12/15/2021   Fluad Trivalent(High Dose 65+) 12/14/2022   Influenza, High Dose Seasonal PF 06/02/2018   PFIZER(Purple Top)SARS-COV-2 Vaccination 04/30/2019, 05/23/2019, 03/28/2020   Pfizer(Comirnaty)Fall Seasonal Vaccine 12 years and older 12/15/2021   Pneumococcal Conjugate-13 02/26/2017   Pneumococcal Polysaccharide-23 06/30/2011   Tdap 12/21/2020   Zoster Recombinant(Shingrix) 06/02/2018, 09/11/2020   Zoster, Live 06/30/2011    TDAP status: Up to date  Flu Vaccine status: Up to date  Pneumococcal vaccine status: Up to date  Covid-19 vaccine status: Information provided on how to obtain vaccines.   Qualifies for Shingles Vaccine? No   Zostavax completed Yes   Shingrix Completed?: Yes  Screening Tests Health Maintenance  Topic Date Due   Colonoscopy  05/26/2022   COVID-19 Vaccine (5 - 2024-25 season) 11/15/2022   Medicare Annual Wellness (AWV)  04/27/2024   DTaP/Tdap/Td (2 - Td or Tdap) 12/22/2030   Pneumonia Vaccine 58+ Years old  Completed   INFLUENZA VACCINE  Completed   Hepatitis C Screening  Completed   Zoster Vaccines- Shingrix  Completed   HPV VACCINES  Aged Out    Health Maintenance  Health Maintenance Due  Topic Date Due   Colonoscopy  05/26/2022   COVID-19 Vaccine (5 - 2024-25 season) 11/15/2022    Colorectal cancer screening: No longer required.   Lung Cancer Screening: (Low Dose CT Chest recommended if Age 72-80 years, 20 pack-year currently smoking OR have quit w/in 15years.) does not qualify.   Lung Cancer Screening Referral:   Additional Screening:  Hepatitis C Screening: does not  qualify; Completed 2017  Vision Screening: Recommended annual ophthalmology exams for early detection of glaucoma and other disorders of the eye. Is the patient up to date with their annual eye exam?  Yes  Who is the provider or what is the name of the office in which the patient attends annual eye exams? [facility] If pt is not established with a provider, would they like to be referred to a provider to establish care? No .   Dental Screening: Recommended annual dental exams for proper oral hygiene  Community Resource Referral / Chronic Care Management: CRR required this visit?  No   CCM required this visit?  No     Plan:     I have personally reviewed and noted the following in the patient's chart:   Medical and social history Use of alcohol, tobacco or illicit drugs  Current medications and supplements including opioid prescriptions. Patient is not currently taking opioid prescriptions. Functional ability and status Nutritional status Physical activity Advanced directives List of other physicians Hospitalizations, surgeries, and ER visits in previous 12 months Vitals Screenings to include cognitive, depression, and falls Referrals and appointments  In addition, I have reviewed and discussed with patient certain preventive protocols, quality metrics, and best practice recommendations. A written personalized care plan for preventive services as well as general preventive health recommendations were provided to patient.     Remi Haggard, LPN   08/18/5407   After Visit Summary: (MyChart) Due to this being a telephonic visit, the after visit summary with patients personalized plan was offered to patient via MyChart   Nurse Notes:

## 2023-04-28 NOTE — Patient Instructions (Signed)
Andrew Stevenson , Thank you for taking time to come for your Medicare Wellness Visit. I appreciate your ongoing commitment to your health goals. Please review the following plan we discussed and let me know if I can assist you in the future.   Screening recommendations/referrals: Colonoscopy: no longer required Recommended yearly ophthalmology/optometry visit for glaucoma screening and checkup Recommended yearly dental visit for hygiene and checkup  Vaccinations: Influenza vaccine: up to date Pneumococcal vaccine: up to date Tdap vaccine: up to date Shingles vaccine: up to date        Preventive Care 79 Years and Older, Male Preventive care refers to lifestyle choices and visits with your health care provider that can promote health and wellness. What does preventive care include? A yearly physical exam. This is also called an annual well check. Dental exams once or twice a year. Routine eye exams. Ask your health care provider how often you should have your eyes checked. Personal lifestyle choices, including: Daily care of your teeth and gums. Regular physical activity. Eating a healthy diet. Avoiding tobacco and drug use. Limiting alcohol use. Practicing safe sex. Taking low doses of aspirin every day. Taking vitamin and mineral supplements as recommended by your health care provider. What happens during an annual well check? The services and screenings done by your health care provider during your annual well check will depend on your age, overall health, lifestyle risk factors, and family history of disease. Counseling  Your health care provider may ask you questions about your: Alcohol use. Tobacco use. Drug use. Emotional well-being. Home and relationship well-being. Sexual activity. Eating habits. History of falls. Memory and ability to understand (cognition). Work and work Astronomer. Screening  You may have the following tests or measurements: Height, weight, and  BMI. Blood pressure. Lipid and cholesterol levels. These may be checked every 5 years, or more frequently if you are over 79 years old. Skin check. Lung cancer screening. You may have this screening every year starting at age 79 if you have a 30-pack-year history of smoking and currently smoke or have quit within the past 15 years. Fecal occult blood test (FOBT) of the stool. You may have this test every year starting at age 79. Flexible sigmoidoscopy or colonoscopy. You may have a sigmoidoscopy every 5 years or a colonoscopy every 10 years starting at age 79. Prostate cancer screening. Recommendations will vary depending on your family history and other risks. Hepatitis C blood test. Hepatitis B blood test. Sexually transmitted disease (STD) testing. Diabetes screening. This is done by checking your blood sugar (glucose) after you have not eaten for a while (fasting). You may have this done every 1-3 years. Abdominal aortic aneurysm (AAA) screening. You may need this if you are a current or former smoker. Osteoporosis. You may be screened starting at age 79 if you are at high risk. Talk with your health care provider about your test results, treatment options, and if necessary, the need for more tests. Vaccines  Your health care provider may recommend certain vaccines, such as: Influenza vaccine. This is recommended every year. Tetanus, diphtheria, and acellular pertussis (Tdap, Td) vaccine. You may need a Td booster every 10 years. Zoster vaccine. You may need this after age 79. Pneumococcal 13-valent conjugate (PCV13) vaccine. One dose is recommended after age 79. Pneumococcal polysaccharide (PPSV23) vaccine. One dose is recommended after age 79. Talk to your health care provider about which screenings and vaccines you need and how often you need them. This information is  not intended to replace advice given to you by your health care provider. Make sure you discuss any questions you have  with your health care provider. Document Released: 03/29/2015 Document Revised: 11/20/2015 Document Reviewed: 01/01/2015 Elsevier Interactive Patient Education  2017 [employer].  Fall Prevention in the Home Falls can cause injuries. They can happen to people of all ages. There are many things you can do to make your home safe and to help prevent falls. What can I do on the outside of my home? Regularly fix the edges of walkways and driveways and fix any cracks. Remove anything that might make you trip as you walk through a door, such as a raised step or threshold. Trim any bushes or trees on the path to your home. Use bright outdoor lighting. Clear any walking paths of anything that might make someone trip, such as rocks or tools. Regularly check to see if handrails are loose or broken. Make sure that both sides of any steps have handrails. Any raised decks and porches should have guardrails on the edges. Have any leaves, snow, or ice cleared regularly. Use sand or salt on walking paths during winter. Clean up any spills in your garage right away. This includes oil or grease spills. What can I do in the bathroom? Use night lights. Install grab bars by the toilet and in the tub and shower. Do not use towel bars as grab bars. Use non-skid mats or decals in the tub or shower. If you need to sit down in the shower, use a plastic, non-slip stool. Keep the floor dry. Clean up any water that spills on the floor as soon as it happens. Remove soap buildup in the tub or shower regularly. Attach bath mats securely with double-sided non-slip rug tape. Do not have throw rugs and other things on the floor that can make you trip. What can I do in the bedroom? Use night lights. Make sure that you have a light by your bed that is easy to reach. Do not use any sheets or blankets that are too big for your bed. They should not hang down onto the floor. Have a firm chair that has side arms. You can use  this for support while you get dressed. Do not have throw rugs and other things on the floor that can make you trip. What can I do in the kitchen? Clean up any spills right away. Avoid walking on wet floors. Keep items that you use a lot in easy-to-reach places. If you need to reach something above you, use a strong step stool that has a grab bar. Keep electrical cords out of the way. Do not use floor polish or wax that makes floors slippery. If you must use wax, use non-skid floor wax. Do not have throw rugs and other things on the floor that can make you trip. What can I do with my stairs? Do not leave any items on the stairs. Make sure that there are handrails on both sides of the stairs and use them. Fix handrails that are broken or loose. Make sure that handrails are as long as the stairways. Check any carpeting to make sure that it is firmly attached to the stairs. Fix any carpet that is loose or worn. Avoid having throw rugs at the top or bottom of the stairs. If you do have throw rugs, attach them to the floor with carpet tape. Make sure that you have a light switch at the top of the  stairs and the bottom of the stairs. If you do not have them, ask someone to add them for you. What else can I do to help prevent falls? Wear shoes that: Do not have high heels. Have rubber bottoms. Are comfortable and fit you well. Are closed at the toe. Do not wear sandals. If you use a stepladder: Make sure that it is fully opened. Do not climb a closed stepladder. Make sure that both sides of the stepladder are locked into place. Ask someone to hold it for you, if possible. Clearly mark and make sure that you can see: Any grab bars or handrails. First and last steps. Where the edge of each step is. Use tools that help you move around (mobility aids) if they are needed. These include: Canes. Walkers. Scooters. Crutches. Turn on the lights when you go into a dark area. Replace any light bulbs  as soon as they burn out. Set up your furniture so you have a clear path. Avoid moving your furniture around. If any of your floors are uneven, fix them. If there are any pets around you, be aware of where they are. Review your medicines with your doctor. Some medicines can make you feel dizzy. This can increase your chance of falling. Ask your doctor what other things that you can do to help prevent falls. This information is not intended to replace advice given to you by your health care provider. Make sure you discuss any questions you have with your health care provider. Document Released: 12/27/2008 Document Revised: 08/08/2015 Document Reviewed: 04/06/2014 Elsevier Interactive Patient Education  2017 [employer].

## 2023-04-29 ENCOUNTER — Ambulatory Visit: Payer: PPO | Admitting: Family Medicine

## 2023-04-29 NOTE — Progress Notes (Deleted)
 OFFICE VISIT  04/29/2023  CC: No chief complaint on file.   Patient is a 78 y.o. male who presents for a finger concern as well as a urinary concern.  HPI: ***  Past Medical History:  Diagnosis Date   Adult ADHD    adderall caused elevated bp   Anxiety and depression    in past/ due to back injuries   Benign paroxysmal positional vertigo    Branch retinal artery occlusion of right eye    COVID-19 virus infection 09/2020   Disequilibrium syndrome    Carotid dopplers and MRI brain NORMAL x 2 (including eighth nerve studies on most recent MRI 08/2016).   Erectile dysfunction    Essential hypertension    Failed back surgical syndrome    Dr. Marikay Alar 04/16/20->ref to pain mgmt-> SPINAL CORD STIMULATOR helpful (2022)   Gross hematuria    passed a stone prior to the planned CT 10/2020   History of concussion    june 2015--  hit head w/ syncope---  no residual   History of memory loss    per prior PCP records./ per pt due to medication   History of syncope    june 2015 --syncope w/ fall and pt states has happened 5 more times , the last one being jan 2016,,  states had battery of test and unknown cause   Hx of adenomatous colonic polyps 05/25/2017   Recall 05/2022   Hyperlipidemia 02/2016   atorva 02/2016---lipid #s worse so changed to generic crestor.  Much improved on crestor.   IFG (impaired fasting glucose) 02/2016   HbA1c 5 %.   Insomnia    Moderate aortic stenosis    Plan repeat echo 03/2022   Nephrolithiasis    passed stone 10/2020   OAB (overactive bladder)    Osteoarthritis, multiple sites    C-spine, L spine, knees, "all my joints basically"   Prostate cancer [facility]) urologist-  dr ottelin/  oncologist-  dr Dayton Scrape   2016 Stage T1c,  Gleason 4+3,  PSA 5.26, vol 49.19cc.  No biochemical recurrence as of 11/2020.   RLS (restless legs syndrome)    Spinal stenosis, lumbar region with neurogenic claudication    s/p L3-S1 fusion revision 09/2018. Pain worse 05/2019->MRI showed  ankylosis + surg fusion, L2-3 being the only mobile level of lower spine. SPINAL CORD STIMULATOR->helpful.   Thrombocytopenia (HCC)    >100K.  Mild, chronic   Upper airway cough syndrome 2018   Dr. Craige Cotta.  Cough therapy maximized at f/u 07/2016; referred to ENT by Pulm.  Allergy testing OK. Improved as of 08/05/16 pulm f/u.   Vertebrobasilar insufficiency    per neurologist note dr Epimenio Foot-- this is possible causing syncope-- no tx just be careful getting up from sitting position   White coat syndrome without hypertension     Past Surgical History:  Procedure Laterality Date   Carotid Dopplers  08/01/2014   NORMAL (Dr. Epimenio Foot)   CERVICAL FUSION  last one 2010   2 times   COLONOSCOPY  02/2004; 05/2017   2019: adenomatous polyp, int hem, diverticulosis, radiation proctitis.  Recall 05/2022.   EYE SURGERY Bilateral 2013   5 right eye, 3 left eye surgeries (including cataract extraction's iol w/ lens implant, other surgery's for post-op lens fungal infection and floaters)   GOLD SEED IMPLANT N/A 02/22/2015   Procedure: GOLD SEED IMPLANT;  Surgeon: Ihor Gully, MD;  Location: [facility];  Service: Urology;  Laterality: N/A;   LEFT THIGH QUADRICEP  MUSCLE BIOPSY'S  12/31/2005   LUMBAR FUSION  x2  last one 2010   4 surgeries in back per pt   MRA/MRI w/out contrast--brain  12/2014   mild, age-related chronic microvascular ischemic change (Dr. Epimenio Foot).   RADIOACTIVE SEED IMPLANT N/A 05/17/2015   Procedure: RADIOACTIVE SEED IMPLANT/BRACHYTHERAPY IMPLANT;  Surgeon: Ihor Gully, MD;  Location: [facility];  Service: Urology;  Laterality: N/A;   SPINAL CORD STIMULATOR IMPLANT     Spinal cord stimulator implantation  06/2020   HELPS!   TRANSRECTAL ULTRASOUND N/A 11/12/2014   Procedure: TRANSRECTAL ULTRASOUND AND BIOPSY  ;  Surgeon: Ihor Gully, MD;  Location: [facility];  Service: Urology;  Laterality: N/A;   TRANSTHORACIC ECHOCARDIOGRAM  03/29/2014   mild  concentric LVH/  ef 55-60%/  grd I DD. AV poorly visualized, possibly bicupid;  mild AS, valve area 1.78cm^2, mean grandient 16mm Hg/  trivial TR.  03/21/21: EF 60-65%, mod aortic stenosis (mean 21, peak 35, area 1.14 cm2), aortic valve is tricuspid,, mild MR, nl LV syst fxn, grd I DD.    Outpatient Medications Prior to Visit  Medication Sig Dispense Refill   amphetamine-dextroamphetamine (ADDERALL XR) 30 MG 24 hr capsule 2 tabs po qAM 60 capsule 0   b complex vitamins capsule Take 1 capsule by mouth daily.     benzonatate (TESSALON) 200 MG capsule Take 1 capsule (200 mg total) by mouth 3 (three) times daily as needed for cough. 20 capsule 0   celecoxib (CELEBREX) 200 MG capsule Take 1 capsule (200 mg total) by mouth daily. (Patient not taking: Reported on 04/28/2023) 90 capsule 3   gabapentin (NEURONTIN) 600 MG tablet Take 600 mg by mouth 2 (two) times daily. (Patient not taking: Reported on 04/28/2023)     Magnesium 500 MG TABS Take 500 mg by mouth daily.     Melatonin 3 MG CAPS Take by mouth.     Multiple Vitamin (MULTIVITAMIN) tablet Take 1 tablet by mouth daily.     Omega-3 Fatty Acids (FISH OIL) 300 MG CAPS Take by mouth.     rOPINIRole (REQUIP) 1 MG tablet TAKE 5 TABLETS BY MOUTH AT BEDTIME. MUST KEEP APPT FOR FURTHER REFILLS 450 tablet 1   rosuvastatin (CRESTOR) 10 MG tablet TAKE 1 TABLET BY MOUTH EVERY DAY 30 tablet 0   scopolamine (TRANSDERM-SCOP) 1 MG/3DAYS Place 1 patch (1.5 mg total) onto the skin every 3 (three) days. (Patient not taking: Reported on 04/28/2023) 4 patch 1   sertraline (ZOLOFT) 50 MG tablet Take 50 mg by mouth. (Patient not taking: Reported on 04/28/2023)     tadalafil (CIALIS) 20 MG tablet Take 10 mg by mouth daily.     tadalafil (CIALIS) 5 MG tablet Take 5 mg by mouth daily.     tamsulosin (FLOMAX) 0.4 MG CAPS capsule Take 0.4 mg by mouth daily.     TART CHERRY PO Take by mouth daily.     traZODone (DESYREL) 150 MG tablet Take 150 mg by mouth at bedtime as needed.      traZODone (DESYREL) 50 MG tablet TAKE 1 TO 2 TABLETS BY MOUTH EVERY DAY AT BEDTIME AS NEEDED FOR INSOMNIA 180 tablet 1   valsartan (DIOVAN) 80 MG tablet 1 and 1/2 tabs po qd 1351 tablet 1   VITAMIN D PO Take 50,000 Units by mouth 2 (two) times daily.     No facility-administered medications prior to visit.    Allergies  Allergen Reactions   Methylphenidate Palpitations  Pt reported; confirmed he wanted med added to his allergy list   Other     Narcotics;   Nightmares, hallucinations   Oxycodone Itching and Other (See Comments)    Nightmares, hallucinations   Penicillins Other (See Comments)    Unknown childhood reaction    Review of Systems  As per HPI  PE:    12/14/2022    1:34 PM 12/14/2022    1:19 PM 10/14/2022   10:44 AM  Vitals with BMI  Height  6\' 3"    Weight  232 lbs 3 oz 231 lbs 6 oz  BMI  29.02   Systolic 130 159 161  Diastolic 78 76 73  Pulse  70 62     Physical Exam  ***  LABS:  Last metabolic panel Lab Results  Component Value Date   GLUCOSE 96 12/14/2022   NA 142 12/14/2022   K 4.8 12/14/2022   CL 105 12/14/2022   CO2 30 12/14/2022   BUN 20 12/14/2022   CREATININE 1.12 12/14/2022   GFR 62.85 12/14/2022   CALCIUM 9.1 12/14/2022   PROT 6.4 02/20/2022   ALBUMIN 4.3 02/20/2022   LABGLOB 2.7 07/06/2014   AGRATIO 1.4 07/06/2014   BILITOT 0.9 02/20/2022   ALKPHOS 59 02/20/2022   AST 24 02/20/2022   ALT 22 02/20/2022   ANIONGAP 6 09/03/2020   Last hemoglobin A1c Lab Results  Component Value Date   HGBA1C 5.0 02/27/2016   IMPRESSION AND PLAN:  No problem-specific Assessment & Plan notes found for this encounter.   An After Visit Summary was printed and given to the patient.  FOLLOW UP: No follow-ups on file.  Signed:  Santiago Bumpers, MD           04/29/2023

## 2023-04-29 NOTE — Progress Notes (Deleted)
 OFFICE VISIT  04/29/2023  CC: No chief complaint on file.   Patient is a 78 y.o. male who presents for multiple concerns.  HPI: ***   He has a history of brachytherapy/radioactive seed implant for prostate cancer back in 2017.  Past Medical History:  Diagnosis Date   Adult ADHD    adderall caused elevated bp   Anxiety and depression    in past/ due to back injuries   Benign paroxysmal positional vertigo    Branch retinal artery occlusion of right eye    COVID-19 virus infection 09/2020   Disequilibrium syndrome    Carotid dopplers and MRI brain NORMAL x 2 (including eighth nerve studies on most recent MRI 08/2016).   Erectile dysfunction    Essential hypertension    Failed back surgical syndrome    Dr. Marikay Alar 04/16/20->ref to pain mgmt-> SPINAL CORD STIMULATOR helpful (2022)   Gross hematuria    passed a stone prior to the planned CT 10/2020   History of concussion    june 2015--  hit head w/ syncope---  no residual   History of memory loss    per prior PCP records./ per pt due to medication   History of syncope    june 2015 --syncope w/ fall and pt states has happened 5 more times , the last one being jan 2016,,  states had battery of test and unknown cause   Hx of adenomatous colonic polyps 05/25/2017   Recall 05/2022   Hyperlipidemia 02/2016   atorva 02/2016---lipid #s worse so changed to generic crestor.  Much improved on crestor.   IFG (impaired fasting glucose) 02/2016   HbA1c 5 %.   Insomnia    Moderate aortic stenosis    Plan repeat echo 03/2022   Nephrolithiasis    passed stone 10/2020   OAB (overactive bladder)    Osteoarthritis, multiple sites    C-spine, L spine, knees, "all my joints basically"   Prostate cancer [facility]) urologist-  dr ottelin/  oncologist-  dr Dayton Scrape   2016 Stage T1c,  Gleason 4+3,  PSA 5.26, vol 49.19cc.  No biochemical recurrence as of 11/2020.   RLS (restless legs syndrome)    Spinal stenosis, lumbar region with neurogenic claudication     s/p L3-S1 fusion revision 09/2018. Pain worse 05/2019->MRI showed ankylosis + surg fusion, L2-3 being the only mobile level of lower spine. SPINAL CORD STIMULATOR->helpful.   Thrombocytopenia (HCC)    >100K.  Mild, chronic   Upper airway cough syndrome 2018   Dr. Craige Cotta.  Cough therapy maximized at f/u 07/2016; referred to ENT by Pulm.  Allergy testing OK. Improved as of 08/05/16 pulm f/u.   Vertebrobasilar insufficiency    per neurologist note dr Epimenio Foot-- this is possible causing syncope-- no tx just be careful getting up from sitting position   White coat syndrome without hypertension     Past Surgical History:  Procedure Laterality Date   Carotid Dopplers  08/01/2014   NORMAL (Dr. Epimenio Foot)   CERVICAL FUSION  last one 2010   2 times   COLONOSCOPY  02/2004; 05/2017   2019: adenomatous polyp, int hem, diverticulosis, radiation proctitis.  Recall 05/2022.   EYE SURGERY Bilateral 2013   5 right eye, 3 left eye surgeries (including cataract extraction's iol w/ lens implant, other surgery's for post-op lens fungal infection and floaters)   GOLD SEED IMPLANT N/A 02/22/2015   Procedure: GOLD SEED IMPLANT;  Surgeon: Ihor Gully, MD;  Location: [facility];  Service:  Urology;  Laterality: N/A;   LEFT THIGH QUADRICEP MUSCLE BIOPSY'S  12/31/2005   LUMBAR FUSION  x2  last one 2010   4 surgeries in back per pt   MRA/MRI w/out contrast--brain  12/2014   mild, age-related chronic microvascular ischemic change (Dr. Epimenio Foot).   RADIOACTIVE SEED IMPLANT N/A 05/17/2015   Procedure: RADIOACTIVE SEED IMPLANT/BRACHYTHERAPY IMPLANT;  Surgeon: Ihor Gully, MD;  Location: [facility];  Service: Urology;  Laterality: N/A;   SPINAL CORD STIMULATOR IMPLANT     Spinal cord stimulator implantation  06/2020   HELPS!   TRANSRECTAL ULTRASOUND N/A 11/12/2014   Procedure: TRANSRECTAL ULTRASOUND AND BIOPSY  ;  Surgeon: Ihor Gully, MD;  [facility];  Service: Urology;   Laterality: N/A;   TRANSTHORACIC ECHOCARDIOGRAM  03/29/2014   mild concentric LVH/  ef 55-60%/  grd I DD. AV poorly visualized, possibly bicupid;  mild AS, valve area 1.78cm^2, mean grandient 16mm Hg/  trivial TR.  03/21/21: EF 60-65%, mod aortic stenosis (mean 21, peak 35, area 1.14 cm2), aortic valve is tricuspid,, mild MR, nl LV syst fxn, grd I DD.    Outpatient Medications Prior to Visit  Medication Sig Dispense Refill   amphetamine-dextroamphetamine (ADDERALL XR) 30 MG 24 hr capsule 2 tabs po qAM 60 capsule 0   b complex vitamins capsule Take 1 capsule by mouth daily.     benzonatate (TESSALON) 200 MG capsule Take 1 capsule (200 mg total) by mouth 3 (three) times daily as needed for cough. 20 capsule 0   celecoxib (CELEBREX) 200 MG capsule Take 1 capsule (200 mg total) by mouth daily. (Patient not taking: Reported on 04/28/2023) 90 capsule 3   gabapentin (NEURONTIN) 600 MG tablet Take 600 mg by mouth 2 (two) times daily. (Patient not taking: Reported on 04/28/2023)     Magnesium 500 MG TABS Take 500 mg by mouth daily.     Melatonin 3 MG CAPS Take by mouth.     Multiple Vitamin (MULTIVITAMIN) tablet Take 1 tablet by mouth daily.     Omega-3 Fatty Acids (FISH OIL) 300 MG CAPS Take by mouth.     rOPINIRole (REQUIP) 1 MG tablet TAKE 5 TABLETS BY MOUTH AT BEDTIME. MUST KEEP APPT FOR FURTHER REFILLS 450 tablet 1   rosuvastatin (CRESTOR) 10 MG tablet TAKE 1 TABLET BY MOUTH EVERY DAY 30 tablet 0   scopolamine (TRANSDERM-SCOP) 1 MG/3DAYS Place 1 patch (1.5 mg total) onto the skin every 3 (three) days. (Patient not taking: Reported on 04/28/2023) 4 patch 1   sertraline (ZOLOFT) 50 MG tablet Take 50 mg by mouth. (Patient not taking: Reported on 04/28/2023)     tadalafil (CIALIS) 20 MG tablet Take 10 mg by mouth daily.     tadalafil (CIALIS) 5 MG tablet Take 5 mg by mouth daily.     tamsulosin (FLOMAX) 0.4 MG CAPS capsule Take 0.4 mg by mouth daily.     TART CHERRY PO Take by mouth daily.     traZODone  (DESYREL) 150 MG tablet Take 150 mg by mouth at bedtime as needed.     traZODone (DESYREL) 50 MG tablet TAKE 1 TO 2 TABLETS BY MOUTH EVERY DAY AT BEDTIME AS NEEDED FOR INSOMNIA 180 tablet 1   valsartan (DIOVAN) 80 MG tablet 1 and 1/2 tabs po qd 1351 tablet 1   VITAMIN D PO Take 50,000 Units by mouth 2 (two) times daily.     No facility-administered medications prior to visit.    Allergies  Allergen Reactions   Methylphenidate Palpitations    Pt reported; confirmed he wanted med added to his allergy list   Other     Narcotics;   Nightmares, hallucinations   Oxycodone Itching and Other (See Comments)    Nightmares, hallucinations   Penicillins Other (See Comments)    Unknown childhood reaction    Review of Systems  As per HPI  PE:    12/14/2022    1:34 PM 12/14/2022    1:19 PM 10/14/2022   10:44 AM  Vitals with BMI  Height  6\' 3"    Weight  232 lbs 3 oz 231 lbs 6 oz  BMI  29.02   Systolic 130 159 161  Diastolic 78 76 73  Pulse  70 62     Physical Exam  ***  LABS:  Last metabolic panel Lab Results  Component Value Date   GLUCOSE 96 12/14/2022   NA 142 12/14/2022   K 4.8 12/14/2022   CL 105 12/14/2022   CO2 30 12/14/2022   BUN 20 12/14/2022   CREATININE 1.12 12/14/2022   GFR 62.85 12/14/2022   CALCIUM 9.1 12/14/2022   PROT 6.4 02/20/2022   ALBUMIN 4.3 02/20/2022   LABGLOB 2.7 07/06/2014   AGRATIO 1.4 07/06/2014   BILITOT 0.9 02/20/2022   ALKPHOS 59 02/20/2022   AST 24 02/20/2022   ALT 22 02/20/2022   ANIONGAP 6 09/03/2020   Last hemoglobin A1c Lab Results  Component Value Date   HGBA1C 5.0 02/27/2016   Lab Results  Component Value Date   PSA 0.019 11/20/2020   PSA 0.021 11/22/2019   PSA 0.03 (L) 12/19/2018   IMPRESSION AND PLAN:  No problem-specific Assessment & Plan notes found for this encounter.   An After Visit Summary was printed and given to the patient.  FOLLOW UP: No follow-ups on file.  Signed:  Santiago Bumpers, MD            04/29/2023

## 2023-05-01 ENCOUNTER — Other Ambulatory Visit: Payer: Self-pay | Admitting: Family Medicine

## 2023-05-04 ENCOUNTER — Other Ambulatory Visit: Payer: Self-pay | Admitting: Family Medicine

## 2023-05-04 MED ORDER — ROPINIROLE HCL 1 MG PO TABS: ORAL_TABLET | ORAL | 0 refills | Status: DC

## 2023-05-04 NOTE — Telephone Encounter (Signed)
 Copied from CRM [id number]. Topic: Clinical - Medication Refill >> May 04, 2023  9:11 AM Kathryne Eriksson wrote: Most Recent Primary Care Visit:  Provider: Laurey Arrow  Department: [facility]  Visit Type: ANNUAL WELL VISIT, SEQUENTIAL  Date: 04/28/2023  Medication: rOPINIRole (REQUIP) 1 MG tablet  Has the patient contacted their pharmacy? Yes (Agent: If no, request that the patient contact the pharmacy for the refill. If patient does not wish to contact the pharmacy document the reason why and proceed with request.) (Agent: If yes, when and what did the pharmacy advise?)  Is this the correct pharmacy for this prescription? Yes If no, delete pharmacy and type the correct one.  This is the patient's preferred pharmacy:  [id number] - [place], [place] - [address] [place] [address] Phone: [phone] Fax: [phone]   Has the prescription been filled recently? No  Is the patient out of the medication? Yes  Has the patient been seen for an appointment in the last year OR does the patient have an upcoming appointment? Yes  Can we respond through MyChart? Yes  Agent: Please be advised that Rx refills may take up to 3 business days. We ask that you follow-up with your pharmacy.

## 2023-05-05 ENCOUNTER — Other Ambulatory Visit: Payer: Self-pay | Admitting: Family Medicine

## 2023-05-06 ENCOUNTER — Ambulatory Visit: Payer: PPO | Admitting: Family Medicine

## 2023-05-06 NOTE — Progress Notes (Deleted)
 OFFICE VISIT  05/06/2023  CC: No chief complaint on file.   Patient is a 78 y.o. male who presents for concern of having diabetes.  HPI: ***  +Hx prediab based more on IFG #s over the years than on A1c testing.  Past Medical History:  Diagnosis Date   Adult ADHD    adderall caused elevated bp   Anxiety and depression    in past/ due to back injuries   Benign paroxysmal positional vertigo    Branch retinal artery occlusion of right eye    COVID-19 virus infection 09/2020   Disequilibrium syndrome    Carotid dopplers and MRI brain NORMAL x 2 (including eighth nerve studies on most recent MRI 08/2016).   Erectile dysfunction    Essential hypertension    Failed back surgical syndrome    Dr. Marikay Alar 04/16/20->ref to pain mgmt-> SPINAL CORD STIMULATOR helpful (2022)   Gross hematuria    passed a stone prior to the planned CT 10/2020   History of concussion    june 2015--  hit head w/ syncope---  no residual   History of memory loss    per prior PCP records./ per pt due to medication   History of syncope    june 2015 --syncope w/ fall and pt states has happened 5 more times , the last one being jan 2016,,  states had battery of test and unknown cause   Hx of adenomatous colonic polyps 05/25/2017   Recall 05/2022   Hyperlipidemia 02/2016   atorva 02/2016---lipid #s worse so changed to generic crestor.  Much improved on crestor.   IFG (impaired fasting glucose) 02/2016   HbA1c 5 %.   Insomnia    Moderate aortic stenosis    Plan repeat echo 03/2022   Nephrolithiasis    passed stone 10/2020   OAB (overactive bladder)    Osteoarthritis, multiple sites    C-spine, L spine, knees, "all my joints basically"   Prostate cancer [facility]) urologist-  dr ottelin/  oncologist-  dr Dayton Scrape   2016 Stage T1c,  Gleason 4+3,  PSA 5.26, vol 49.19cc.  No biochemical recurrence as of 11/2020.   RLS (restless legs syndrome)    Spinal stenosis, lumbar region with neurogenic claudication    s/p L3-S1  fusion revision 09/2018. Pain worse 05/2019->MRI showed ankylosis + surg fusion, L2-3 being the only mobile level of lower spine. SPINAL CORD STIMULATOR->helpful.   Thrombocytopenia (HCC)    >100K.  Mild, chronic   Upper airway cough syndrome 2018   Dr. Craige Cotta.  Cough therapy maximized at f/u 07/2016; referred to ENT by Pulm.  Allergy testing OK. Improved as of 08/05/16 pulm f/u.   Vertebrobasilar insufficiency    per neurologist note dr Epimenio Foot-- this is possible causing syncope-- no tx just be careful getting up from sitting position   White coat syndrome without hypertension     Past Surgical History:  Procedure Laterality Date   Carotid Dopplers  08/01/2014   NORMAL (Dr. Epimenio Foot)   CERVICAL FUSION  last one 2010   2 times   COLONOSCOPY  02/2004; 05/2017   2019: adenomatous polyp, int hem, diverticulosis, radiation proctitis.  Recall 05/2022.   EYE SURGERY Bilateral 2013   5 right eye, 3 left eye surgeries (including cataract extraction's iol w/ lens implant, other surgery's for post-op lens fungal infection and floaters)   GOLD SEED IMPLANT N/A 02/22/2015   Procedure: GOLD SEED IMPLANT;  Surgeon: Ihor Gully, MD;  Location: [facility] ;  Service: Urology;  Laterality: N/A;   LEFT THIGH QUADRICEP MUSCLE BIOPSY'S  12/31/2005   LUMBAR FUSION  x2  last one 2010   4 surgeries in back per pt   MRA/MRI w/out contrast--brain  12/2014   mild, age-related chronic microvascular ischemic change (Dr. Epimenio Foot).   RADIOACTIVE SEED IMPLANT N/A 05/17/2015   Procedure: RADIOACTIVE SEED IMPLANT/BRACHYTHERAPY IMPLANT;  Surgeon: Ihor Gully, MD;  Location: [facility] ;  Service: Urology;  Laterality: N/A;   SPINAL CORD STIMULATOR IMPLANT     Spinal cord stimulator implantation  06/2020   HELPS!   TRANSRECTAL ULTRASOUND N/A 11/12/2014   Procedure: TRANSRECTAL ULTRASOUND AND BIOPSY  ;  Surgeon: Ihor Gully, MD;  Location: [facility];  Service: Urology;  Laterality:  N/A;   TRANSTHORACIC ECHOCARDIOGRAM  03/29/2014   mild concentric LVH/  ef 55-60%/  grd I DD. AV poorly visualized, possibly bicupid;  mild AS, valve area 1.78cm^2, mean grandient 16mm Hg/  trivial TR.  03/21/21: EF 60-65%, mod aortic stenosis (mean 21, peak 35, area 1.14 cm2), aortic valve is tricuspid,, mild MR, nl LV syst fxn, grd I DD.    Outpatient Medications Prior to Visit  Medication Sig Dispense Refill   amphetamine-dextroamphetamine (ADDERALL XR) 30 MG 24 hr capsule 2 tabs po qAM 60 capsule 0   b complex vitamins capsule Take 1 capsule by mouth daily.     benzonatate (TESSALON) 200 MG capsule Take 1 capsule (200 mg total) by mouth 3 (three) times daily as needed for cough. 20 capsule 0   celecoxib (CELEBREX) 200 MG capsule Take 1 capsule (200 mg total) by mouth daily. (Patient not taking: Reported on 04/28/2023) 90 capsule 3   gabapentin (NEURONTIN) 600 MG tablet Take 600 mg by mouth 2 (two) times daily. (Patient not taking: Reported on 04/28/2023)     Magnesium 500 MG TABS Take 500 mg by mouth daily.     Melatonin 3 MG CAPS Take by mouth.     Multiple Vitamin (MULTIVITAMIN) tablet Take 1 tablet by mouth daily.     Omega-3 Fatty Acids (FISH OIL) 300 MG CAPS Take by mouth.     rOPINIRole (REQUIP) 1 MG tablet TAKE 5 TABLETS BY MOUTH AT BEDTIME. MUST KEEP APPT FOR FURTHER REFILLS 70 tablet 0   rosuvastatin (CRESTOR) 10 MG tablet TAKE 1 TABLET BY MOUTH EVERY DAY 30 tablet 0   scopolamine (TRANSDERM-SCOP) 1 MG/3DAYS Place 1 patch (1.5 mg total) onto the skin every 3 (three) days. (Patient not taking: Reported on 04/28/2023) 4 patch 1   sertraline (ZOLOFT) 50 MG tablet Take 50 mg by mouth. (Patient not taking: Reported on 04/28/2023)     tadalafil (CIALIS) 20 MG tablet Take 10 mg by mouth daily.     tadalafil (CIALIS) 5 MG tablet Take 5 mg by mouth daily.     tamsulosin (FLOMAX) 0.4 MG CAPS capsule Take 0.4 mg by mouth daily.     TART CHERRY PO Take by mouth daily.     traZODone (DESYREL) 150  MG tablet Take 150 mg by mouth at bedtime as needed.     traZODone (DESYREL) 50 MG tablet TAKE 1 TO 2 TABLETS BY MOUTH EVERY DAY AT BEDTIME AS NEEDED FOR INSOMNIA 180 tablet 1   valsartan (DIOVAN) 80 MG tablet 1 and 1/2 tabs po qd 1351 tablet 1   VITAMIN D PO Take 50,000 Units by mouth 2 (two) times daily.     No facility-administered medications prior to visit.  Allergies  Allergen Reactions   Methylphenidate Palpitations    Pt reported; confirmed he wanted med added to his allergy list   Other     Narcotics;   Nightmares, hallucinations   Oxycodone Itching and Other (See Comments)    Nightmares, hallucinations   Penicillins Other (See Comments)    Unknown childhood reaction    Review of Systems  As per HPI  PE:    12/14/2022    1:34 PM 12/14/2022    1:19 PM 10/14/2022   10:44 AM  Vitals with BMI  Height  6\' 3"    Weight  232 lbs 3 oz 231 lbs 6 oz  BMI  29.02   Systolic 130 159 161  Diastolic 78 76 73  Pulse  70 62     Physical Exam  ***  LABS:  Last CBC Lab Results  Component Value Date   WBC 3.8 (L) 02/20/2022   HGB 14.9 02/20/2022   HCT 45.4 02/20/2022   MCV 92.3 02/20/2022   MCH 30.0 09/03/2020   RDW 13.8 02/20/2022   PLT 99.0 (L) 02/20/2022   Last metabolic panel Lab Results  Component Value Date   GLUCOSE 96 12/14/2022   NA 142 12/14/2022   K 4.8 12/14/2022   CL 105 12/14/2022   CO2 30 12/14/2022   BUN 20 12/14/2022   CREATININE 1.12 12/14/2022   GFR 62.85 12/14/2022   CALCIUM 9.1 12/14/2022   PROT 6.4 02/20/2022   ALBUMIN 4.3 02/20/2022   LABGLOB 2.7 07/06/2014   AGRATIO 1.4 07/06/2014   BILITOT 0.9 02/20/2022   ALKPHOS 59 02/20/2022   AST 24 02/20/2022   ALT 22 02/20/2022   ANIONGAP 6 09/03/2020   Last lipids Lab Results  Component Value Date   CHOL 132 02/20/2022   HDL 59.80 02/20/2022   LDLCALC 54 02/20/2022   LDLDIRECT 98.0 08/20/2016   TRIG 89.0 02/20/2022   CHOLHDL 2 02/20/2022   Last hemoglobin A1c Lab Results   Component Value Date   HGBA1C 5.0 02/27/2016   Lab Results  Component Value Date   PSA 0.019 11/20/2020   PSA 0.021 11/22/2019   PSA 0.03 (L) 12/19/2018   IMPRESSION AND PLAN:  No problem-specific Assessment & Plan notes found for this encounter.   An After Visit Summary was printed and given to the patient.  FOLLOW UP: No follow-ups on file.  Signed:  Santiago Bumpers, MD           05/06/2023

## 2023-05-18 ENCOUNTER — Other Ambulatory Visit (HOSPITAL_COMMUNITY): Payer: Self-pay | Admitting: Student

## 2023-05-18 ENCOUNTER — Encounter: Payer: Self-pay | Admitting: Student

## 2023-05-18 DIAGNOSIS — M5412 Radiculopathy, cervical region: Secondary | ICD-10-CM

## 2023-05-18 DIAGNOSIS — Z6828 Body mass index (BMI) 28.0-28.9, adult: Secondary | ICD-10-CM | POA: Diagnosis not present

## 2023-05-18 DIAGNOSIS — M542 Cervicalgia: Secondary | ICD-10-CM | POA: Diagnosis not present

## 2023-05-20 DIAGNOSIS — F411 Generalized anxiety disorder: Secondary | ICD-10-CM | POA: Diagnosis not present

## 2023-05-21 ENCOUNTER — Encounter (HOSPITAL_COMMUNITY): Payer: Self-pay | Admitting: Radiology

## 2023-05-30 ENCOUNTER — Encounter (HOSPITAL_BASED_OUTPATIENT_CLINIC_OR_DEPARTMENT_OTHER): Payer: Self-pay

## 2023-05-30 ENCOUNTER — Ambulatory Visit (HOSPITAL_BASED_OUTPATIENT_CLINIC_OR_DEPARTMENT_OTHER)
Admission: RE | Admit: 2023-05-30 | Discharge: 2023-05-30 | Disposition: A | Discharge status: Home / Self Care | Source: Ambulatory Visit | Attending: Student | Admitting: Student

## 2023-05-30 DIAGNOSIS — M5412 Radiculopathy, cervical region: Secondary | ICD-10-CM

## 2023-06-13 ENCOUNTER — Other Ambulatory Visit: Payer: Self-pay | Admitting: Family Medicine

## 2023-06-15 ENCOUNTER — Ambulatory Visit (HOSPITAL_COMMUNITY)

## 2023-06-19 ENCOUNTER — Ambulatory Visit (HOSPITAL_COMMUNITY)

## 2023-06-21 ENCOUNTER — Encounter (HOSPITAL_COMMUNITY): Payer: Self-pay

## 2023-06-21 ENCOUNTER — Ambulatory Visit (HOSPITAL_COMMUNITY)
Admission: RE | Admit: 2023-06-21 | Discharge: 2023-06-21 | Disposition: A | Discharge status: Home / Self Care | Source: Ambulatory Visit | Attending: Student | Admitting: Student

## 2023-06-21 DIAGNOSIS — M5412 Radiculopathy, cervical region: Secondary | ICD-10-CM

## 2023-06-23 DIAGNOSIS — F411 Generalized anxiety disorder: Secondary | ICD-10-CM | POA: Diagnosis not present

## 2023-06-24 ENCOUNTER — Ambulatory Visit (HOSPITAL_COMMUNITY)
Admission: RE | Admit: 2023-06-24 | Discharge: 2023-06-24 | Disposition: A | Discharge status: Home / Self Care | Source: Ambulatory Visit | Attending: Student | Admitting: Student

## 2023-06-24 DIAGNOSIS — M5412 Radiculopathy, cervical region: Secondary | ICD-10-CM | POA: Diagnosis not present

## 2023-06-24 DIAGNOSIS — M47811 Spondylosis without myelopathy or radiculopathy, occipito-atlanto-axial region: Secondary | ICD-10-CM | POA: Diagnosis not present

## 2023-06-24 DIAGNOSIS — M4802 Spinal stenosis, cervical region: Secondary | ICD-10-CM | POA: Diagnosis not present

## 2023-06-24 DIAGNOSIS — M2548 Effusion, other site: Secondary | ICD-10-CM | POA: Diagnosis not present

## 2023-06-24 DIAGNOSIS — M47812 Spondylosis without myelopathy or radiculopathy, cervical region: Secondary | ICD-10-CM | POA: Diagnosis not present

## 2023-07-01 DIAGNOSIS — R42 Dizziness and giddiness: Secondary | ICD-10-CM | POA: Diagnosis not present

## 2023-07-21 ENCOUNTER — Ambulatory Visit (HOSPITAL_COMMUNITY)

## 2023-07-28 ENCOUNTER — Ambulatory Visit: Admitting: Family Medicine

## 2023-07-28 NOTE — Progress Notes (Deleted)
 OFFICE VISIT  07/28/2023  CC: No chief complaint on file.   Patient is a 78 y.o. male who presents for knee pain.  HPI: ***  Past Medical History:  Diagnosis Date   Adult ADHD    adderall caused elevated bp   Anxiety and depression    in past/ due to back injuries   Benign paroxysmal positional vertigo    Branch retinal artery occlusion of right eye    COVID-19 virus infection 09/2020   Disequilibrium syndrome    Carotid dopplers and MRI brain NORMAL x 2 (including eighth nerve studies on most recent MRI 08/2016).   Erectile dysfunction    Essential hypertension    Failed back surgical syndrome    Dr. Waymond Hailey 04/16/20->ref to pain mgmt-> SPINAL CORD STIMULATOR helpful (2022)   Gross hematuria    passed a stone prior to the planned CT 10/2020   History of concussion    june 2015--  hit head w/ syncope---  no residual   History of memory loss    per prior PCP records./ per pt due to medication   History of syncope    june 2015 --syncope w/ fall and pt states has happened 5 more times , the last one being jan 2016,,  states had battery of test and unknown cause   Hx of adenomatous colonic polyps 05/25/2017   Recall 05/2022   Hyperlipidemia 02/2016   atorva 02/2016---lipid #s worse so changed to generic crestor .  Much improved on crestor .   IFG (impaired fasting glucose) 02/2016   HbA1c 5 %.   Insomnia    Moderate aortic stenosis    Plan repeat echo 03/2022   Nephrolithiasis    passed stone 10/2020   OAB (overactive bladder)    Osteoarthritis, multiple sites    C-spine, L spine, knees, "all my joints basically"   Prostate cancer [facility]) urologist-  dr ottelin/  oncologist-  dr Ike Malady   2016 Stage T1c,  Gleason 4+3,  PSA 5.26, vol 49.19cc.  No biochemical recurrence as of 11/2020.   RLS (restless legs syndrome)    Spinal stenosis, lumbar region with neurogenic claudication    s/p L3-S1 fusion revision 09/2018. Pain worse 05/2019->MRI showed ankylosis + surg fusion, L2-3 being  the only mobile level of lower spine. SPINAL CORD STIMULATOR->helpful.   Thrombocytopenia (HCC)    >100K.  Mild, chronic   Upper airway cough syndrome 2018   Dr. Matilde Son.  Cough therapy maximized at f/u 07/2016; referred to ENT by Pulm.  Allergy  testing OK. Improved as of 08/05/16 pulm f/u.   Vertebrobasilar insufficiency    per neurologist note dr Godwin Lat-- this is possible causing syncope-- no tx just be careful getting up from sitting position   White coat syndrome without hypertension     Past Surgical History:  Procedure Laterality Date   Carotid Dopplers  08/01/2014   NORMAL (Dr. Godwin Lat)   CERVICAL FUSION  last one 2010   2 times   COLONOSCOPY  02/2004; 05/2017   2019: adenomatous polyp, int hem, diverticulosis, radiation proctitis.  Recall 05/2022.   EYE SURGERY Bilateral 2013   5 right eye, 3 left eye surgeries (including cataract extraction's iol w/ lens implant, other surgery's for post-op lens fungal infection and floaters)   GOLD SEED IMPLANT N/A 02/22/2015   Procedure: GOLD SEED IMPLANT;  Surgeon: Mark Ottelin, MD;  Location: [facility];  Service: Urology;  Laterality: N/A;   LEFT THIGH QUADRICEP MUSCLE BIOPSY'S  12/31/2005   LUMBAR  FUSION  x2  last one 2010   4 surgeries in back per pt   MRA/MRI w/out contrast--brain  12/2014   mild, age-related chronic microvascular ischemic change (Dr. Godwin Lat).   RADIOACTIVE SEED IMPLANT N/A 05/17/2015   Procedure: RADIOACTIVE SEED IMPLANT/BRACHYTHERAPY IMPLANT;  Surgeon: Mark Ottelin, MD;  Location: [facility];  Service: Urology;  Laterality: N/A;   SPINAL CORD STIMULATOR IMPLANT     Spinal cord stimulator implantation  06/2020   HELPS!   TRANSRECTAL ULTRASOUND N/A 11/12/2014   Procedure: TRANSRECTAL ULTRASOUND AND BIOPSY  ;  Surgeon: Mark Ottelin, MD;  Location: [facility];  Service: Urology;  Laterality: N/A;   TRANSTHORACIC ECHOCARDIOGRAM  03/29/2014   mild concentric LVH/  ef 55-60%/  grd I  DD. AV poorly visualized, possibly bicupid;  mild AS, valve area 1.78cm^2, mean grandient 16mm Hg/  trivial TR.  03/21/21: EF 60-65%, mod aortic stenosis (mean 21, peak 35, area 1.14 cm2), aortic valve is tricuspid,, mild MR, nl LV syst fxn, grd I DD.    Outpatient Medications Prior to Visit  Medication Sig Dispense Refill   amphetamine -dextroamphetamine  (ADDERALL XR) 30 MG 24 hr capsule 2 tabs po qAM 60 capsule 0   b complex vitamins capsule Take 1 capsule by mouth daily.     benzonatate  (TESSALON ) 200 MG capsule Take 1 capsule (200 mg total) by mouth 3 (three) times daily as needed for cough. 20 capsule 0   celecoxib  (CELEBREX ) 200 MG capsule Take 1 capsule (200 mg total) by mouth daily. (Patient not taking: Reported on 04/28/2023) 90 capsule 3   gabapentin  (NEURONTIN ) 600 MG tablet Take 600 mg by mouth 2 (two) times daily. (Patient not taking: Reported on 04/28/2023)     Magnesium 500 MG TABS Take 500 mg by mouth daily.     Melatonin 3 MG CAPS Take by mouth.     Multiple Vitamin (MULTIVITAMIN) tablet Take 1 tablet by mouth daily.     Omega-3 Fatty Acids (FISH OIL) 300 MG CAPS Take by mouth.     rOPINIRole  (REQUIP ) 1 MG tablet TAKE 5 TABLETS BY MOUTH AT BEDTIME. MUST KEEP APPT FOR FURTHER REFILLS 70 tablet 0   rosuvastatin  (CRESTOR ) 10 MG tablet TAKE 1 TABLET BY MOUTH EVERY DAY 30 tablet 0   scopolamine  (TRANSDERM-SCOP) 1 MG/3DAYS Place 1 patch (1.5 mg total) onto the skin every 3 (three) days. (Patient not taking: Reported on 04/28/2023) 4 patch 1   sertraline (ZOLOFT) 50 MG tablet Take 50 mg by mouth. (Patient not taking: Reported on 04/28/2023)     tadalafil  (CIALIS ) 20 MG tablet Take 10 mg by mouth daily.     tadalafil  (CIALIS ) 5 MG tablet Take 5 mg by mouth daily.     tamsulosin  (FLOMAX ) 0.4 MG CAPS capsule Take 0.4 mg by mouth daily.     TART CHERRY PO Take by mouth daily.     traZODone  (DESYREL ) 150 MG tablet Take 150 mg by mouth at bedtime as needed.     traZODone  (DESYREL ) 50 MG tablet  TAKE 1 TO 2 TABLETS BY MOUTH EVERY DAY AT BEDTIME AS NEEDED FOR INSOMNIA 180 tablet 1   valsartan  (DIOVAN ) 80 MG tablet 1 and 1/2 tabs po qd 1351 tablet 1   VITAMIN D PO Take 50,000 Units by mouth 2 (two) times daily.     No facility-administered medications prior to visit.    Allergies  Allergen Reactions   Methylphenidate  Palpitations    Pt reported; confirmed he wanted med  added to his allergy  list   Other     Narcotics;   Nightmares, hallucinations   Oxycodone  Itching and Other (See Comments)    Nightmares, hallucinations   Penicillins Other (See Comments)    Unknown childhood reaction    Review of Systems  As per HPI  PE:    12/14/2022    1:34 PM 12/14/2022    1:19 PM 10/14/2022   10:44 AM  Vitals with BMI  Height  6\' 3"    Weight  232 lbs 3 oz 231 lbs 6 oz  BMI  29.02   Systolic 130 159 161  Diastolic 78 76 73  Pulse  70 62     Physical Exam  ***  LABS:  Last CBC Lab Results  Component Value Date   WBC 3.8 (L) 02/20/2022   HGB 14.9 02/20/2022   HCT 45.4 02/20/2022   MCV 92.3 02/20/2022   MCH 30.0 09/03/2020   RDW 13.8 02/20/2022   PLT 99.0 (L) 02/20/2022   Last metabolic panel Lab Results  Component Value Date   GLUCOSE 96 12/14/2022   NA 142 12/14/2022   K 4.8 12/14/2022   CL 105 12/14/2022   CO2 30 12/14/2022   BUN 20 12/14/2022   CREATININE 1.12 12/14/2022   GFR 62.85 12/14/2022   CALCIUM  9.1 12/14/2022   PROT 6.4 02/20/2022   ALBUMIN 4.3 02/20/2022   LABGLOB 2.7 07/06/2014   AGRATIO 1.4 07/06/2014   BILITOT 0.9 02/20/2022   ALKPHOS 59 02/20/2022   AST 24 02/20/2022   ALT 22 02/20/2022   ANIONGAP 6 09/03/2020   Last lipids Lab Results  Component Value Date   CHOL 132 02/20/2022   HDL 59.80 02/20/2022   LDLCALC 54 02/20/2022   LDLDIRECT 98.0 08/20/2016   TRIG 89.0 02/20/2022   CHOLHDL 2 02/20/2022   Last hemoglobin A1c Lab Results  Component Value Date   HGBA1C 5.0 02/27/2016   Last thyroid  functions Lab Results   Component Value Date   TSH 2.00 02/27/2016   IMPRESSION AND PLAN:  No problem-specific Assessment & Plan notes found for this encounter.   An After Visit Summary was printed and given to the patient.  FOLLOW UP: No follow-ups on file.  Signed:  Arletha Lady, MD           07/28/2023

## 2023-08-17 ENCOUNTER — Telehealth: Payer: Self-pay

## 2023-08-17 ENCOUNTER — Ambulatory Visit (INDEPENDENT_AMBULATORY_CARE_PROVIDER_SITE_OTHER): Admitting: Urgent Care

## 2023-08-17 VITALS — BP 102/57 | HR 65 | Temp 98.1°F | Wt 237.0 lb

## 2023-08-17 DIAGNOSIS — R9431 Abnormal electrocardiogram [ECG] [EKG]: Secondary | ICD-10-CM | POA: Diagnosis not present

## 2023-08-17 DIAGNOSIS — J209 Acute bronchitis, unspecified: Secondary | ICD-10-CM | POA: Diagnosis not present

## 2023-08-17 DIAGNOSIS — I4581 Long QT syndrome: Secondary | ICD-10-CM

## 2023-08-17 MED ORDER — PROMETHAZINE-CODEINE 6.25-10 MG/5ML PO SOLN: 5.0000 mL | Freq: Four times a day (QID) | ORAL | 0 refills | Status: DC | PRN

## 2023-08-17 MED ORDER — PREDNISONE 50 MG PO TABS: 50.0000 mg | ORAL_TABLET | Freq: Every day | ORAL | 0 refills | Status: DC

## 2023-08-17 MED ORDER — METHYLPREDNISOLONE ACETATE 80 MG/ML IJ SUSP
80.0000 mg | Freq: Once | INTRAMUSCULAR | Status: AC
  Administered 2023-08-17: 80 mg via INTRAMUSCULAR

## 2023-08-17 MED ORDER — ALBUTEROL SULFATE (2.5 MG/3ML) 0.083% IN NEBU: 2.5000 mg | INHALATION_SOLUTION | Freq: Once | RESPIRATORY_TRACT | Status: DC

## 2023-08-17 MED ORDER — DOXYCYCLINE HYCLATE 100 MG PO TABS: 100.0000 mg | ORAL_TABLET | Freq: Two times a day (BID) | ORAL | 0 refills | Status: AC

## 2023-08-17 NOTE — Patient Instructions (Signed)
 Unfortunately given your cardiac issues, we cannot administer a breathing treatment for you today.  You were given an injection of a steroid. Please start the oral prednisone  tomorrow with breakfast. Start the doxycycline  twice daily. Drink plenty of water  on this antibiotic and avoid excessive sun exposure. Use the cough suppressant as needed, keep in mind it may make you feel dizzy or drowsy so do not drive or operate heavy machinery after taking.  Please go to [facility] to obtain a stat chest xray: [facility] Address: [address], [place], [place] [address] Phone: [phone]  We will contact you with the lab results if any concerning findings.  Please schedule a one week follow up.

## 2023-08-17 NOTE — Progress Notes (Unsigned)
 Established Patient Office Visit  Subjective:  Patient ID: Andrew Stevenson, male    DOB: 1945/10/03  Age: 78 y.o. MRN: [id number]  Chief Complaint  Patient presents with   Cough    Pt has had a cough that's been going on for a week now. He has also had congestion and rib soreness. The cough is worse in the mornings. He tested negative for Covid 3 days ago.    HPI  Discussed the use of AI scribe software for clinical note transcription with the patient, who gave verbal consent to proceed.  History of Present Illness   Andrew Stevenson is a 78 year old male who presents with a persistent cough and mucus production.  He has been experiencing a persistent cough for the past week, which began a week ago this past Monday. The cough is accompanied by thick mucus production, described as resembling 'oatmeal.' The cough tends to ease during the day but worsens at night, especially when he gets hot, leading to a sensation of his lungs filling up. This results in waking up coughing and hacking, which he feels has caused him to pull something in his chest due to the constant nature of the cough.  The cough is described as 'wet throughout the day,' with difficulty in expectorating the mucus. He has tried over-the-counter treatments such as Nyquil, cough medicine, cough drops, and Mucinex, but these have not been effective in alleviating his symptoms.  No recent travel or exposure to sick individuals. He has been mostly staying at home due to a past event that led to a diagnosis of PTSD, making him a recluse. He reports feeling hot most of the time but has not checked his temperature and does not believe he has a fever.  No sinus pain or pressure, but there is drainage down the back of his throat and pressure in the ears. He experiences a mild sore throat from coughing, which he describes as tolerable. He also reports headaches located around his eyes.       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   Vertebro basilar ischemia 12/18/2014   Transient cerebral ischemia 12/18/2014   Malignant neoplasm of prostate (HCC) 12/03/2014   Numbness 07/06/2014   Restless leg syndrome 07/06/2014   Syncope 03/28/2014   Orthostatic hypotension 03/28/2014   Right-sided low back pain without sciatica 03/28/2014   HTN (hypertension) 03/28/2014   Insomnia 03/28/2014   Prolonged Q-T interval on ECG 03/28/2014   Syncope and collapse 03/28/2014   Past Medical History:  Diagnosis Date   Adult ADHD    adderall caused elevated bp   Anxiety and depression    in past/ due to back injuries   Benign paroxysmal positional vertigo    Branch retinal artery occlusion of right eye    COVID-19 virus infection 09/2020   Disequilibrium syndrome    Carotid dopplers and MRI brain NORMAL x 2 (including eighth nerve studies on most recent MRI 08/2016).   Erectile dysfunction    Essential hypertension    Failed back surgical syndrome  Dr. Waymond Hailey 04/16/20->ref to pain mgmt-> SPINAL CORD STIMULATOR helpful (2022)   Gross hematuria    passed a stone prior to the planned CT 10/2020   History of concussion    june 2015--  hit head w/ syncope---  no residual   History of memory loss    per prior PCP records./ per pt due to medication   History of syncope    june 2015 --syncope w/ fall and pt states has happened 5 more times , the last one being jan 2016,,  states had battery of test and unknown cause   Hx of adenomatous colonic polyps 05/25/2017   Recall 05/2022    Hyperlipidemia 02/2016   atorva 02/2016---lipid #s worse so changed to generic crestor .  Much improved on crestor .   IFG (impaired fasting glucose) 02/2016   HbA1c 5 %.   Insomnia    Moderate aortic stenosis    Plan repeat echo 03/2022   Nephrolithiasis    passed stone 10/2020   OAB (overactive bladder)    Osteoarthritis, multiple sites    C-spine, L spine, knees, "all my joints basically"   Prostate cancer [facility]) urologist-  dr ottelin/  oncologist-  dr Ike Malady   2016 Stage T1c,  Gleason 4+3,  PSA 5.26, vol 49.19cc.  No biochemical recurrence as of 11/2020.   RLS (restless legs syndrome)    Spinal stenosis, lumbar region with neurogenic claudication    s/p L3-S1 fusion revision 09/2018. Pain worse 05/2019->MRI showed ankylosis + surg fusion, L2-3 being the only mobile level of lower spine. SPINAL CORD STIMULATOR->helpful.   Thrombocytopenia (HCC)    >100K.  Mild, chronic   Upper airway cough syndrome 2018   Dr. Matilde Son.  Cough therapy maximized at f/u 07/2016; referred to ENT by Pulm.  Allergy  testing OK. Improved as of 08/05/16 pulm f/u.   Vertebrobasilar insufficiency    per neurologist note dr Godwin Lat-- this is possible causing syncope-- no tx just be careful getting up from sitting position   White coat syndrome without hypertension    Past Surgical History:  Procedure Laterality Date   Carotid Dopplers  08/01/2014   NORMAL (Dr. Godwin Lat)   CERVICAL FUSION  last one 2010   2 times   COLONOSCOPY  02/2004; 05/2017   2019: adenomatous polyp, int hem, diverticulosis, radiation proctitis.  Recall 05/2022.   EYE SURGERY Bilateral 2013   5 right eye, 3 left eye surgeries (including cataract extraction's iol w/ lens implant, other surgery's for post-op lens fungal infection and floaters)   GOLD SEED IMPLANT N/A 02/22/2015   Procedure: GOLD SEED IMPLANT;  Surgeon: Mark Ottelin, MD;  Location: [facility];  Service: Urology;  Laterality: N/A;   LEFT THIGH QUADRICEP MUSCLE BIOPSY'S   12/31/2005   LUMBAR FUSION  x2  last one 2010   4 surgeries in back per pt   MRA/MRI w/out contrast--brain  12/2014   mild, age-related chronic microvascular ischemic change (Dr. Godwin Lat).   RADIOACTIVE SEED IMPLANT N/A 05/17/2015   Procedure: RADIOACTIVE SEED IMPLANT/BRACHYTHERAPY IMPLANT;  Surgeon: Mark Ottelin, MD;  Location: [facility] ;  Service: Urology;  Laterality: N/A;   SPINAL CORD STIMULATOR IMPLANT     Spinal cord stimulator implantation  06/2020   HELPS!   TRANSRECTAL ULTRASOUND N/A 11/12/2014   Procedure: TRANSRECTAL ULTRASOUND AND BIOPSY  ;  Surgeon: Mark Ottelin, MD;  Location: [facility];  Service: Urology;  Laterality: N/A;   TRANSTHORACIC ECHOCARDIOGRAM  03/29/2014   mild concentric LVH/  ef 55-60%/  grd I DD. AV poorly visualized, possibly bicupid;  mild AS, valve area 1.78cm^2, mean grandient 16mm Hg/  trivial TR.  03/21/21: EF 60-65%, mod aortic stenosis (mean 21, peak 35, area 1.14 cm2), aortic valve is tricuspid,, mild MR, nl LV syst fxn, grd I DD.   Social History   Tobacco Use   Smoking status: Some Days    Types: Cigarettes, Cigars   Smokeless tobacco: Former    Types: Snuff    Quit date: 04/12/2018   Tobacco comments:    using snuff x 25 years  Vaping Use   Vaping status: Never Used  Substance Use Topics   Alcohol use: Yes    Alcohol/week: 0.0 standard drinks of alcohol    Comment: rare   Drug use: No      ROS: as noted in HPI  Objective:     BP (!) 102/57   Pulse 65   Temp 98.1 F (36.7 C) (Oral)   Wt 237 lb (107.5 kg)   SpO2 96%   BMI 29.62 kg/m  BP Readings from Last 3 Encounters:  08/17/23 (!) 102/57  12/14/22 130/78  10/14/22 129/73   Wt Readings from Last 3 Encounters:  08/17/23 237 lb (107.5 kg)  12/14/22 232 lb 3.2 oz (105.3 kg)  10/14/22 231 lb 6.4 oz (105 kg)      Physical Exam Vitals and nursing note reviewed.  Constitutional:      General: He is not in acute distress.    Appearance:  Normal appearance. He is not ill-appearing, toxic-appearing or diaphoretic.  HENT:     Head:     Jaw: No trismus.     Salivary Glands: Right salivary gland is not diffusely enlarged or tender. Left salivary gland is not diffusely enlarged or tender.     Right Ear: No middle ear effusion. Tympanic membrane is not injected, scarred, perforated or erythematous.     Left Ear:  No middle ear effusion. Tympanic membrane is not injected, scarred, perforated or erythematous.     Nose: Congestion and rhinorrhea present. Rhinorrhea is clear.     Mouth/Throat:     Lips: Pink.     Mouth: Mucous membranes are moist.     Pharynx: Uvula midline. Posterior oropharyngeal erythema present. No oropharyngeal exudate or uvula swelling.  Eyes:     General: No scleral icterus.       Right eye: No discharge.        Left eye: No discharge.     Extraocular Movements: Extraocular movements intact.     Pupils: Pupils are equal, round, and reactive to light.  Cardiovascular:     Rate and Rhythm: Normal rate.  Pulmonary:     Effort: Pulmonary effort is normal.     Breath sounds: Wheezing (significant wheezes appreciated all breath sounds, posterior lung fields) present.  Abdominal:     General: Abdomen is flat.     Palpations: Abdomen is soft.  Musculoskeletal:     Cervical back: Normal range of motion and neck supple. No rigidity.  Lymphadenopathy:     Cervical: No cervical adenopathy.  Neurological:     Mental Status: He is alert.         The 10-year ASCVD risk score (Arnett DK, et al., 2019) is: 22.3%  Assessment & Plan:  Acute bronchitis, unspecified organism -     methylPREDNISolone  Acetate -     DG Chest 2 View; Future -     Doxycycline  Hyclate; Take 1 tablet (  100 mg total) by mouth 2 (two) times daily for 7 days.  Dispense: 14 tablet; Refill: 0 -     predniSONE ; Take 1 tablet (50 mg total) by mouth daily with breakfast.  Dispense: 5 tablet; Refill: 0 -     CBC with Differential/Platelet -      Comprehensive metabolic panel with GFR  Abnormal EKG -     EKG 12-Lead -     CBC with Differential/Platelet -     Comprehensive metabolic panel with GFR  Long Q-T syndrome  Assessment and Plan    Acute Bronchitis Cough with thick mucus, wheezing, and chest tightness consistent with bronchitis. Pt would have benefited from albuterol  neb tx in office, however EKG confirms the dx of long QT syndrome, in which case risk is considered higher than benefit. Therefore, discussed IM medrol  injection instead. - Administer Medrol  injection.  - CXR to r/o pneumonia - CMP/ CBC - PO doxycycline  BID - PO prednisone  - PRN cough med for night-time use only - RTC one week for follow up and recheck. Could consider inhaled steroid and/or ipratropium upon follow up if wheezing is still present.  Long QT Noted on chart - repeat EKG done in office to confirm dx. Unfortunately azithromycin  and albuterol  not recommended due to this condition so tx options adjusted accordingly.         Return in about 1 week (around 08/24/2023).   Mandy Second, PA

## 2023-08-17 NOTE — Telephone Encounter (Signed)
 Communication  Reason for CRM: patient has a terrible cough that he can not get rid of he would like to be seen today he feels really bad he would like a call back regarding this   Pt scheduled to see Dr. Gatha Kaska today.

## 2023-08-18 ENCOUNTER — Other Ambulatory Visit: Payer: Self-pay | Admitting: Internal Medicine

## 2023-08-18 ENCOUNTER — Ambulatory Visit: Admitting: Family Medicine

## 2023-08-18 ENCOUNTER — Ambulatory Visit: Payer: Self-pay

## 2023-08-18 ENCOUNTER — Telehealth: Payer: Self-pay

## 2023-08-18 ENCOUNTER — Other Ambulatory Visit: Payer: Self-pay | Admitting: Urgent Care

## 2023-08-18 ENCOUNTER — Ambulatory Visit (HOSPITAL_BASED_OUTPATIENT_CLINIC_OR_DEPARTMENT_OTHER)
Admission: RE | Admit: 2023-08-18 | Discharge: 2023-08-18 | Disposition: A | Discharge status: Home / Self Care | Source: Ambulatory Visit | Attending: Urgent Care | Admitting: Urgent Care

## 2023-08-18 DIAGNOSIS — R059 Cough, unspecified: Secondary | ICD-10-CM | POA: Diagnosis not present

## 2023-08-18 DIAGNOSIS — J209 Acute bronchitis, unspecified: Secondary | ICD-10-CM

## 2023-08-18 DIAGNOSIS — R0602 Shortness of breath: Secondary | ICD-10-CM | POA: Diagnosis not present

## 2023-08-18 LAB — CBC WITH DIFFERENTIAL/PLATELET
Basophils Absolute: 0 10*3/uL (ref 0.0–0.1)
Basophils Relative: 1 % (ref 0.0–3.0)
Eosinophils Absolute: 0.1 10*3/uL (ref 0.0–0.7)
Eosinophils Relative: 2.9 % (ref 0.0–5.0)
HCT: 42.8 % (ref 39.0–52.0)
Hemoglobin: 14 g/dL (ref 13.0–17.0)
Lymphocytes Relative: 13.1 % (ref 12.0–46.0)
Lymphs Abs: 0.5 10*3/uL — ABNORMAL LOW (ref 0.7–4.0)
MCHC: 32.8 g/dL (ref 30.0–36.0)
MCV: 92.6 fl (ref 78.0–100.0)
Monocytes Absolute: 0.5 10*3/uL (ref 0.1–1.0)
Monocytes Relative: 12.1 % — ABNORMAL HIGH (ref 3.0–12.0)
Neutro Abs: 2.9 10*3/uL (ref 1.4–7.7)
Neutrophils Relative %: 70.9 % (ref 43.0–77.0)
Platelets: 83 10*3/uL — ABNORMAL LOW (ref 150.0–400.0)
RBC: 4.62 Mil/uL (ref 4.22–5.81)
RDW: 14.5 % (ref 11.5–15.5)
WBC: 4.1 10*3/uL (ref 4.0–10.5)

## 2023-08-18 LAB — COMPREHENSIVE METABOLIC PANEL WITH GFR
ALT: 19 U/L (ref 0–53)
AST: 23 U/L (ref 0–37)
Albumin: 4.1 g/dL (ref 3.5–5.2)
Alkaline Phosphatase: 60 U/L (ref 39–117)
BUN: 22 mg/dL (ref 6–23)
CO2: 29 meq/L (ref 19–32)
Calcium: 9 mg/dL (ref 8.4–10.5)
Chloride: 109 meq/L (ref 96–112)
Creatinine, Ser: 1 mg/dL (ref 0.40–1.50)
GFR: 71.67 mL/min (ref >60.00)
Glucose, Bld: 91 mg/dL (ref 70–99)
Potassium: 5.1 meq/L (ref 3.5–5.1)
Sodium: 142 meq/L (ref 135–145)
Total Bilirubin: 0.8 mg/dL (ref 0.2–1.2)
Total Protein: 5.9 g/dL — ABNORMAL LOW (ref 6.0–8.3)

## 2023-08-18 MED ORDER — GUAIFENESIN-CODEINE 100-10 MG/5ML PO SOLN: 5.0000 mL | Freq: Two times a day (BID) | ORAL | 0 refills | Status: DC | PRN

## 2023-08-18 MED ORDER — GUAIFENESIN-CODEINE 100-6.33 MG/5ML PO SOLN: 5.0000 mL | Freq: Two times a day (BID) | ORAL | 0 refills | Status: DC | PRN

## 2023-08-18 NOTE — Telephone Encounter (Signed)
 No further action needed.

## 2023-08-18 NOTE — Telephone Encounter (Signed)
 Copied from CRM [id number]. Topic: Clinical - Prescription Issue >> Aug 18, 2023 11:47 AM Dimple Francis wrote: Reason for CRM: Prescription for Promethazine -Codeine 6.25-10 MG/5ML SOLN is not available at the pharmacy, wanting to get a different one sent in as soon as possible

## 2023-08-18 NOTE — Progress Notes (Signed)
 Received a phone call from answering service. Pharmacy declined to dispense codeine rx today at the office due to h/o  allergies.  The patient however clarified that he has been able to tolerate codeine  previously w/o  problems. Requested a new prescription to be sent to [facility]. PDMP okay, prescription sent

## 2023-08-18 NOTE — Telephone Encounter (Signed)
 A new Rx has been called in. thanks

## 2023-08-18 NOTE — Telephone Encounter (Signed)
 Copied from CRM [id number]. Topic: Clinical - Red Word Triage >> Aug 18, 2023  5:01 PM Magdalene School wrote: Red Word that prompted transfer to Nurse Triage: Severe coughing, chocking.

## 2023-08-18 NOTE — Progress Notes (Signed)
 Received note from pharmacy - cough medication called in yesterday has been discontinued.  Will call in alternative.

## 2023-08-19 ENCOUNTER — Encounter: Payer: Self-pay | Admitting: Urgent Care

## 2023-08-19 ENCOUNTER — Telehealth: Payer: Self-pay

## 2023-08-19 ENCOUNTER — Ambulatory Visit: Payer: Self-pay | Admitting: Urgent Care

## 2023-08-19 ENCOUNTER — Other Ambulatory Visit: Payer: Self-pay | Admitting: *Deleted

## 2023-08-19 MED ORDER — AMPHETAMINE-DEXTROAMPHET ER 30 MG PO CP24: ORAL_CAPSULE | ORAL | 0 refills | Status: DC

## 2023-08-19 NOTE — Telephone Encounter (Signed)
 Pharmacy faxed medication alternative for guaiFENesin-codeine syrup dose of 100-10MG / syrup instead of 100-6.33MG /5ML.  On call provider sent new rx yesterday. No further action needed

## 2023-08-19 NOTE — Telephone Encounter (Signed)
 Returned call to patient who wanted to compliment nurse that triaged him on yesterday. Patient also requested a refill on Adderall to be sent to [facility]. Patient also asked that [facility] be removed from his profile as it was only used for the one time refill.

## 2023-08-24 ENCOUNTER — Ambulatory Visit (INDEPENDENT_AMBULATORY_CARE_PROVIDER_SITE_OTHER): Admitting: Family Medicine

## 2023-08-24 ENCOUNTER — Encounter: Payer: Self-pay | Admitting: Family Medicine

## 2023-08-24 VITALS — BP 96/60 | HR 69 | Temp 97.7°F | Ht 75.0 in | Wt 242.6 lb

## 2023-08-24 DIAGNOSIS — F988 Other specified behavioral and emotional disorders with onset usually occurring in childhood and adolescence: Secondary | ICD-10-CM

## 2023-08-24 DIAGNOSIS — I4581 Long QT syndrome: Secondary | ICD-10-CM

## 2023-08-24 DIAGNOSIS — I10 Essential (primary) hypertension: Secondary | ICD-10-CM | POA: Diagnosis not present

## 2023-08-24 DIAGNOSIS — I9589 Other hypotension: Secondary | ICD-10-CM | POA: Diagnosis not present

## 2023-08-24 DIAGNOSIS — J180 Bronchopneumonia, unspecified organism: Secondary | ICD-10-CM | POA: Diagnosis not present

## 2023-08-24 MED ORDER — LEVOFLOXACIN 500 MG PO TABS: 500.0000 mg | ORAL_TABLET | Freq: Every day | ORAL | 0 refills | Status: DC

## 2023-08-24 MED ORDER — AIRSUPRA 90-80 MCG/ACT IN AERO: 2.0000 | INHALATION_SPRAY | Freq: Two times a day (BID) | RESPIRATORY_TRACT | Status: AC

## 2023-08-24 NOTE — Addendum Note (Signed)
 Addended by: Terris Fickle D on: 08/24/2023 02:51 PM   Modules accepted: Orders

## 2023-08-24 NOTE — Progress Notes (Signed)
 OFFICE VISIT  08/24/2023  CC:  Chief Complaint  Patient presents with   Follow-up    "RTC one week for follow up and recheck. Could consider inhaled steroid and/or ipratropium upon follow up if wheezing is still present."    Patient is a 78 y.o. male who presents for ongoing respiratory illness.  Also, follow-up adult ADD and multiple other chronic medical problems.  INTERIM HX: Has significant upper and lower respiratory symptoms for approximately 2 weeks now.  Not improving. Describes nasal congestion, sinus pressure, retro-orbital headache, severe coughing and shortness of breath and chest tightness..  No fever.  He does have some mucus production. He has had a course of doxycycline  and prednisone  and these did not help at all.  He was prescribed a codeine  cough syrup and this did not help.   He is eating fine.  Hydrating very well. He is very fatigued from impaired sleep due to his coughing.  Hurts in his chest every time he coughs. His chronic disequilibrium is worse than his baseline. He has not been monitoring blood pressure.  He has continued to take his valsartan  80 mg, 1-1/2 tabs daily.  ADD: Pt states all is going well with the med at current dosing (adderall xr 60mg  qAM but does not take daily): much improved focus, concentration, task completion.  Less frustration, better multitasking, less impulsivity and restlessness.  Mood is stable. No side effects from the medication. PMP AWARE reviewed today: most recent rx for Adderall XR was filled 04/14/2023, # [phone], rx by me. No red flags.  Past Medical History:  Diagnosis Date   Adult ADHD    adderall caused elevated bp   Anxiety and depression    in past/ due to back injuries   Benign paroxysmal positional vertigo    Branch retinal artery occlusion of right eye    COVID-19 virus infection 09/2020   Disequilibrium syndrome    Carotid dopplers and MRI brain NORMAL x 2 (including eighth nerve studies on most recent MRI 08/2016).    Erectile dysfunction    Essential hypertension    Failed back surgical syndrome    Dr. Waymond Hailey 04/16/20->ref to pain mgmt-> SPINAL CORD STIMULATOR helpful (2022)   Gross hematuria    passed a stone prior to the planned CT 10/2020   History of concussion    june 2015--  hit head w/ syncope---  no residual   History of memory loss    per prior PCP records./ per pt due to medication   History of syncope    june 2015 --syncope w/ fall and pt states has happened 5 more times , the last one being jan 2016,,  states had battery of test and unknown cause   Hx of adenomatous colonic polyps 05/25/2017   Recall 05/2022   Hyperlipidemia 02/2016   atorva 02/2016---lipid #s worse so changed to generic crestor .  Much improved on crestor .   IFG (impaired fasting glucose) 02/2016   HbA1c 5 %.   Insomnia    Moderate aortic stenosis    Plan repeat echo 03/2022   Nephrolithiasis    passed stone 10/2020   OAB (overactive bladder)    Osteoarthritis, multiple sites    C-spine, L spine, knees, "all my joints basically"   Prostate cancer [facility]) urologist-  dr ottelin/  oncologist-  dr Ike Malady   2016 Stage T1c,  Gleason 4+3,  PSA 5.26, vol 49.19cc.  No biochemical recurrence as of 11/2020.   RLS (restless legs syndrome)  Spinal stenosis, lumbar region with neurogenic claudication    s/p L3-S1 fusion revision 09/2018. Pain worse 05/2019->MRI showed ankylosis + surg fusion, L2-3 being the only mobile level of lower spine. SPINAL CORD STIMULATOR->helpful.   Thrombocytopenia (HCC)    >100K.  Mild, chronic   Upper airway cough syndrome 2018   Dr. Matilde Son.  Cough therapy maximized at f/u 07/2016; referred to ENT by Pulm.  Allergy  testing OK. Improved as of 08/05/16 pulm f/u.   Vertebrobasilar insufficiency    per neurologist note dr Godwin Lat-- this is possible causing syncope-- no tx just be careful getting up from sitting position   White coat syndrome without hypertension     Past Surgical History:  Procedure  Laterality Date   Carotid Dopplers  08/01/2014   NORMAL (Dr. Godwin Lat)   CERVICAL FUSION  last one 2010   2 times   COLONOSCOPY  02/2004; 05/2017   2019: adenomatous polyp, int hem, diverticulosis, radiation proctitis.  Recall 05/2022.   EYE SURGERY Bilateral 2013   5 right eye, 3 left eye surgeries (including cataract extraction's iol w/ lens implant, other surgery's for post-op lens fungal infection and floaters)   GOLD SEED IMPLANT N/A 02/22/2015   Procedure: GOLD SEED IMPLANT;  Surgeon: Mark Ottelin, MD;  Location: [facility];  Service: Urology;  Laterality: N/A;   LEFT THIGH QUADRICEP MUSCLE BIOPSY'S  12/31/2005   LUMBAR FUSION  x2  last one 2010   4 surgeries in back per pt   MRA/MRI w/out contrast--brain  12/2014   mild, age-related chronic microvascular ischemic change (Dr. Godwin Lat).   RADIOACTIVE SEED IMPLANT N/A 05/17/2015   Procedure: RADIOACTIVE SEED IMPLANT/BRACHYTHERAPY IMPLANT;  Surgeon: Mark Ottelin, MD;  Location: [facility];  Service: Urology;  Laterality: N/A;   SPINAL CORD STIMULATOR IMPLANT     Spinal cord stimulator implantation  06/2020   HELPS!   TRANSRECTAL ULTRASOUND N/A 11/12/2014   Procedure: TRANSRECTAL ULTRASOUND AND BIOPSY  ;  Surgeon: Mark Ottelin, MD;  Location: [facility];  Service: Urology;  Laterality: N/A;   TRANSTHORACIC ECHOCARDIOGRAM  03/29/2014   mild concentric LVH/  ef 55-60%/  grd I DD. AV poorly visualized, possibly bicupid;  mild AS, valve area 1.78cm^2, mean grandient 16mm Hg/  trivial TR.  03/21/21: EF 60-65%, mod aortic stenosis (mean 21, peak 35, area 1.14 cm2), aortic valve is tricuspid,, mild MR, nl LV syst fxn, grd I DD.    Outpatient Medications Prior to Visit  Medication Sig Dispense Refill   amphetamine -dextroamphetamine  (ADDERALL XR) 30 MG 24 hr capsule 2 tabs po qAM 60 capsule 0   b complex vitamins capsule Take 1 capsule by mouth daily.     doxycycline  (VIBRA -TABS) 100 MG tablet Take 1  tablet (100 mg total) by mouth 2 (two) times daily for 7 days. 14 tablet 0   Magnesium 500 MG TABS Take 500 mg by mouth daily.     Melatonin 3 MG CAPS Take by mouth.     Multiple Vitamin (MULTIVITAMIN) tablet Take 1 tablet by mouth daily.     Omega-3 Fatty Acids (FISH OIL) 300 MG CAPS Take by mouth.     predniSONE  (DELTASONE ) 50 MG tablet Take 1 tablet (50 mg total) by mouth daily with breakfast. 5 tablet 0   rOPINIRole  (REQUIP ) 1 MG tablet TAKE 5 TABLETS BY MOUTH AT BEDTIME. MUST KEEP APPT FOR FURTHER REFILLS 70 tablet 0   rosuvastatin  (CRESTOR ) 10 MG tablet TAKE 1 TABLET BY MOUTH EVERY DAY 30  tablet 0   scopolamine  (TRANSDERM-SCOP) 1 MG/3DAYS Place 1 patch (1.5 mg total) onto the skin every 3 (three) days. 4 patch 1   sertraline (ZOLOFT) 50 MG tablet Take 50 mg by mouth.     tadalafil  (CIALIS ) 20 MG tablet Take 10 mg by mouth daily.     tamsulosin  (FLOMAX ) 0.4 MG CAPS capsule Take 0.4 mg by mouth daily.     TART CHERRY PO Take by mouth daily.     traZODone  (DESYREL ) 150 MG tablet Take 150 mg by mouth at bedtime as needed.     traZODone  (DESYREL ) 50 MG tablet TAKE 1 TO 2 TABLETS BY MOUTH EVERY DAY AT BEDTIME AS NEEDED FOR INSOMNIA 180 tablet 1   valsartan  (DIOVAN ) 80 MG tablet 1 and 1/2 tabs po qd 1351 tablet 1   VITAMIN D PO Take 50,000 Units by mouth 2 (two) times daily.     tadalafil  (CIALIS ) 5 MG tablet Take 5 mg by mouth daily.     benzonatate  (TESSALON ) 200 MG capsule Take 1 capsule (200 mg total) by mouth 3 (three) times daily as needed for cough. (Patient not taking: Reported on 08/24/2023) 20 capsule 0   celecoxib  (CELEBREX ) 200 MG capsule Take 1 capsule (200 mg total) by mouth daily. (Patient not taking: Reported on 04/28/2023) 90 capsule 3   gabapentin  (NEURONTIN ) 600 MG tablet Take 600 mg by mouth 2 (two) times daily. (Patient not taking: Reported on 04/28/2023)     guaiFENesin -codeine  100-10 MG/5ML syrup Take 5 mLs by mouth 2 (two) times daily as needed for cough. (Patient not taking:  Reported on 08/24/2023) 100 mL 0   No facility-administered medications prior to visit.    Allergies  Allergen Reactions   Methylphenidate  Palpitations    Pt reported; confirmed he wanted med added to his allergy  list   Other     Narcotics;   Nightmares, hallucinations   Oxycodone  Itching and Other (See Comments)    Nightmares, hallucinations   Penicillins Other (See Comments)    Unknown childhood reaction    Review of Systems As per HPI  PE:    08/24/2023   10:46 AM 08/17/2023    1:52 PM 12/14/2022    1:34 PM  Vitals with BMI  Height 6\' 3"     Weight 242 lbs 10 oz 237 lbs   BMI 30.32    Systolic 96 102 130  Diastolic 60 57 78  Pulse 69 65      Physical Exam  Gen: Alert, tired-appearing.  No distress.  Patient is oriented to person, place, time, and situation. ENT: Mild pharyngeal erythema and clear postnasal drip. No sinus tenderness. No purulent nasal mucus. No cervical adenopathy. CV: RRR, 4/6 syst murmur best heard at RUSB.  No rub. Lungs are clear to auscultation bilaterally, mild diminished aeration diffusely on exhalation.  No wheezing or prolongation of expiratory phase.  He has excessive coughing with forced exhalation. Extremities: He has 2+ pitting edema on the right lower leg and 1+ pitting edema in the left ankle-->this is his baseline.  LABS:  Last CBC Lab Results  Component Value Date   WBC 4.1 08/17/2023   HGB 14.0 08/17/2023   HCT 42.8 08/17/2023   MCV 92.6 08/17/2023   MCH 30.0 09/03/2020   RDW 14.5 08/17/2023   PLT 83.0 (L) 08/17/2023   Last metabolic panel Lab Results  Component Value Date   GLUCOSE 91 08/17/2023   NA 142 08/17/2023   K 5.1 08/17/2023   CL 109  08/17/2023   CO2 29 08/17/2023   BUN 22 08/17/2023   CREATININE 1.00 08/17/2023   GFR 71.67 08/17/2023   CALCIUM  9.0 08/17/2023   PROT 5.9 (L) 08/17/2023   ALBUMIN 4.1 08/17/2023   LABGLOB 2.7 07/06/2014   AGRATIO 1.4 07/06/2014   BILITOT 0.8 08/17/2023   ALKPHOS 60  08/17/2023   AST 23 08/17/2023   ALT 19 08/17/2023   ANIONGAP 6 09/03/2020   Last lipids Lab Results  Component Value Date   CHOL 132 02/20/2022   HDL 59.80 02/20/2022   LDLCALC 54 02/20/2022   LDLDIRECT 98.0 08/20/2016   TRIG 89.0 02/20/2022   CHOLHDL 2 02/20/2022   Last hemoglobin A1c Lab Results  Component Value Date   HGBA1C 5.0 02/27/2016   Last thyroid  functions Lab Results  Component Value Date   TSH 2.00 02/27/2016   Last vitamin B12 and Folate Lab Results  Component Value Date   VITAMINB12 382 07/06/2014   IMPRESSION AND PLAN:  #1 acute bronchopneumonia. Doxycycline  and prednisone  have not helped.  Chest x-ray 08/18/2023 showed no acute cardiopulmonary disease. White blood cells not elevated on CBC and metabolic panel normal on 08/17/2023.  DuoNeb in the office today led to some improvement in ability to mobilize mucus and slight decrease tendency to cough with exhalation.  Plan: Levaquin  500 mg a day x 7 days. Airsupra (albut/budesonide) sample dispensed today, 2 p bid.  We did discuss the fact that he has long QT and he has increased risk of dangerous arrhythmia when certain medications that prolong QT interval are taken. We decided that given his current clinical situation the potential benefits of the medications outweigh the risks.  #2 adult ADD.  Stable long-term with as needed use of Adderall XR, 1-2 tabs in the morning. He will avoid use of his Adderall until he is finished with his antibiotics and inhalers.  3.  Hypertension.  His blood pressure is low today. Will have him hold his valsartan  until he is feeling well again. He has blood pressure cuff at home and can monitor.  4.  Moderate aortic stenosis. I do not think this is contributing to his current illness but he is due for a monitoring echo. We will order this soon.  An After Visit Summary was printed and given to the patient.  FOLLOW UP: Return in about 3 days (around 08/27/2023) for  Follow-up respiratory illness.  Signed:  Arletha Lady, MD           08/24/2023

## 2023-08-27 ENCOUNTER — Encounter: Payer: Self-pay | Admitting: Family Medicine

## 2023-08-27 ENCOUNTER — Ambulatory Visit: Admitting: Family Medicine

## 2023-08-27 VITALS — BP 112/61 | HR 71 | Temp 97.7°F | Ht 75.0 in | Wt 243.0 lb

## 2023-08-27 DIAGNOSIS — I350 Nonrheumatic aortic (valve) stenosis: Secondary | ICD-10-CM

## 2023-08-27 DIAGNOSIS — I10 Essential (primary) hypertension: Secondary | ICD-10-CM | POA: Diagnosis not present

## 2023-08-27 DIAGNOSIS — R5382 Chronic fatigue, unspecified: Secondary | ICD-10-CM | POA: Diagnosis not present

## 2023-08-27 DIAGNOSIS — J180 Bronchopneumonia, unspecified organism: Secondary | ICD-10-CM | POA: Diagnosis not present

## 2023-08-27 NOTE — Progress Notes (Signed)
 OFFICE VISIT  10/16/2023  CC:  Chief Complaint  Patient presents with   Follow-up    Bronchopneumonia; given sample for Airsupra  during last OV, pt states it has been helping.     Patient is a 78 y.o. male who presents for 3-day follow-up acute bronchopneumonia. A/P as of last visit: #1 acute bronchopneumonia. Doxycycline  and prednisone  have not helped.  Chest x-ray 08/18/2023 showed no acute cardiopulmonary disease. White blood cells not elevated on CBC and metabolic panel normal on 08/17/2023.   DuoNeb in the office today led to some improvement in ability to mobilize mucus and slight decrease tendency to cough with exhalation.   Plan: Levaquin  500 mg a day x 7 days. Airsupra  (albut/budesonide) sample dispensed today, 2 p bid.   We did discuss the fact that he has long QT and he has increased risk of dangerous arrhythmia when certain medications that prolong QT interval are taken. We decided that given his current clinical situation the potential benefits of the medications outweigh the risks.   #2 adult ADD.  Stable long-term with as needed use of Adderall XR, 1-2 tabs in the morning. He will avoid use of his Adderall until he is finished with his antibiotics and inhalers.   3.  Hypertension.  His blood pressure is low today. Will have him hold his valsartan  until he is feeling well again. He has blood pressure cuff at home and can monitor.   4.  Moderate aortic stenosis. I do not think this is contributing to his current illness but he is due for a monitoring echo. We will order this soon.  INTERIM HX: Cough is much improved.  No fevers.  No shortness of breath.  Energy better, appetite better.  Still has concern for chronic fatigue.  Denies any chest pain or lower extremity edema.  No abnormal weight loss. Suffering quite a bit from chronic dizziness/vertigo.   ROS as above, plus-->  no HAs, no rashes, no melena/hematochezia.  No polyuria or polydipsia.  No focal  weakness, paresthesias, or tremors.  No acute vision or hearing abnormalities.  No dysuria or unusual/new urinary urgency or frequency.  No recent changes in lower legs. No n/v/d or abd pain.  No palpitations.     Past Medical History:  Diagnosis Date   Adult ADHD    adderall caused elevated bp   Anxiety and depression    in past/ due to back injuries   Benign paroxysmal positional vertigo    Branch retinal artery occlusion of right eye    COVID-19 virus infection 09/2020   Disequilibrium syndrome    Carotid dopplers and MRI brain NORMAL x 2 (including eighth nerve studies on most recent MRI 08/2016).   Erectile dysfunction    Essential hypertension    Failed back surgical syndrome    Dr. Alm Molt 04/16/20->ref to pain mgmt-> SPINAL CORD STIMULATOR helpful (2022)   Gross hematuria    passed a stone prior to the planned CT 10/2020   History of concussion    june 2015--  hit head w/ syncope---  no residual   History of memory loss    per prior PCP records./ per pt due to medication   History of syncope    june 2015 --syncope w/ fall and pt states has happened 5 more times , the last one being jan 2016,,  states had battery of test and unknown cause   Hx of adenomatous colonic polyps 05/25/2017   Recall 05/2022   Hyperlipidemia 02/2016  atorva 02/2016---lipid #s worse so changed to generic crestor .  Much improved on crestor .   IFG (impaired fasting glucose) 02/2016   HbA1c 5 %.   Insomnia    Moderate aortic stenosis    Plan repeat echo 03/2022   Nephrolithiasis    passed stone 10/2020   OAB (overactive bladder)    Osteoarthritis, multiple sites    C-spine, L spine, knees, all my joints basically   Prostate cancer [facility]) urologist-  dr ottelin/  oncologist-  dr jason   2016 Stage T1c,  Gleason 4+3,  PSA 5.26, vol 49.19cc.  No biochemical recurrence as of 11/2020.   RLS (restless legs syndrome)    Spinal stenosis, lumbar region with neurogenic claudication    s/p L3-S1 fusion  revision 09/2018. Pain worse 05/2019->MRI showed ankylosis + surg fusion, L2-3 being the only mobile level of lower spine. SPINAL CORD STIMULATOR->helpful.   Thrombocytopenia (HCC)    >100K.  Mild, chronic   Upper airway cough syndrome 2018   Dr. Shellia.  Cough therapy maximized at f/u 07/2016; referred to ENT by Pulm.  Allergy  testing OK. Improved as of 08/05/16 pulm f/u.   Vertebrobasilar insufficiency    per neurologist note dr vear-- this is possible causing syncope-- no tx just be careful getting up from sitting position   White coat syndrome without hypertension     Past Surgical History:  Procedure Laterality Date   Carotid Dopplers  08/01/2014   NORMAL (Dr. vear)   CERVICAL FUSION  last one 2010   2 times   COLONOSCOPY  02/2004; 05/2017   2019: adenomatous polyp, int hem, diverticulosis, radiation proctitis.  Recall 05/2022.   EYE SURGERY Bilateral 2013   5 right eye, 3 left eye surgeries (including cataract extraction's iol w/ lens implant, other surgery's for post-op lens fungal infection and floaters)   GOLD SEED IMPLANT N/A 02/22/2015   Procedure: GOLD SEED IMPLANT;  Surgeon: Mark Ottelin, MD;  Location: [facility];  Service: Urology;  Laterality: N/A;   LEFT THIGH QUADRICEP MUSCLE BIOPSY'S  12/31/2005   LUMBAR FUSION  x2  last one 2010   4 surgeries in back per pt   MRA/MRI w/out contrast--brain  12/2014   mild, age-related chronic microvascular ischemic change (Dr. vear).   RADIOACTIVE SEED IMPLANT N/A 05/17/2015   Procedure: RADIOACTIVE SEED IMPLANT/BRACHYTHERAPY IMPLANT;  Surgeon: Mark Ottelin, MD;  Location: [facility];  Service: Urology;  Laterality: N/A;   SPINAL CORD STIMULATOR IMPLANT     Spinal cord stimulator implantation  06/2020   HELPS!   TRANSRECTAL ULTRASOUND N/A 11/12/2014   Procedure: TRANSRECTAL ULTRASOUND AND BIOPSY  ;  Surgeon: Mark Ottelin, MD;  Location: [facility];  Service: Urology;  Laterality: N/A;    TRANSTHORACIC ECHOCARDIOGRAM  03/29/2014   mild concentric LVH/  ef 55-60%/  grd I DD. AV poorly visualized, possibly bicupid;  mild AS, valve area 1.78cm^2, mean grandient 16mm Hg/  trivial TR.  03/21/21: EF 60-65%, mod aortic stenosis (mean 21, peak 35, area 1.14 cm2), aortic valve is tricuspid,, mild MR, nl LV syst fxn, grd I DD.    Outpatient Medications Prior to Visit  Medication Sig Dispense Refill   Albuterol -Budesonide (AIRSUPRA ) 90-80 MCG/ACT AERO Inhale 2 puffs into the lungs 2 (two) times daily. Lot # [id number]     b complex vitamins capsule Take 1 capsule by mouth daily.     Magnesium 500 MG TABS Take 500 mg by mouth daily.     Melatonin  3 MG CAPS Take by mouth.     Multiple Vitamin (MULTIVITAMIN) tablet Take 1 tablet by mouth daily.     Omega-3 Fatty Acids (FISH OIL) 300 MG CAPS Take by mouth.     scopolamine  (TRANSDERM-SCOP) 1 MG/3DAYS Place 1 patch (1.5 mg total) onto the skin every 3 (three) days. 4 patch 1   sertraline (ZOLOFT) 50 MG tablet Take 50 mg by mouth.     tadalafil  (CIALIS ) 20 MG tablet Take 10 mg by mouth daily.     tamsulosin  (FLOMAX ) 0.4 MG CAPS capsule Take 0.4 mg by mouth daily.     TART CHERRY PO Take by mouth daily.     traZODone  (DESYREL ) 150 MG tablet Take 150 mg by mouth at bedtime as needed.     traZODone  (DESYREL ) 50 MG tablet TAKE 1 TO 2 TABLETS BY MOUTH EVERY DAY AT BEDTIME AS NEEDED FOR INSOMNIA 180 tablet 1   valsartan  (DIOVAN ) 80 MG tablet 1 and 1/2 tabs po qd 1351 tablet 1   VITAMIN D PO Take 50,000 Units by mouth 2 (two) times daily.     benzonatate  (TESSALON ) 200 MG capsule Take 1 capsule (200 mg total) by mouth 3 (three) times daily as needed for cough. 20 capsule 0   rOPINIRole  (REQUIP ) 1 MG tablet TAKE 5 TABLETS BY MOUTH AT BEDTIME. MUST KEEP APPT FOR FURTHER REFILLS 70 tablet 0   rosuvastatin  (CRESTOR ) 10 MG tablet TAKE 1 TABLET BY MOUTH EVERY DAY 30 tablet 0   celecoxib  (CELEBREX ) 200 MG capsule Take 1 capsule (200 mg total) by mouth daily.  (Patient not taking: Reported on 09/14/2023) 90 capsule 3   gabapentin  (NEURONTIN ) 600 MG tablet Take 600 mg by mouth 2 (two) times daily. (Patient not taking: Reported on 09/14/2023)     guaiFENesin -codeine  100-10 MG/5ML syrup Take 5 mLs by mouth 2 (two) times daily as needed for cough. (Patient not taking: Reported on 08/27/2023) 100 mL 0   predniSONE  (DELTASONE ) 50 MG tablet Take 1 tablet (50 mg total) by mouth daily with breakfast. (Patient not taking: Reported on 08/27/2023) 5 tablet 0   No facility-administered medications prior to visit.    Allergies  Allergen Reactions   Methylphenidate  Palpitations    Pt reported; confirmed he wanted med added to his allergy  list   Other     Narcotics;   Nightmares, hallucinations   Oxycodone  Itching and Other (See Comments)    Nightmares, hallucinations   Penicillins Other (See Comments)    Unknown childhood reaction    Review of Systems As per HPI  PE:    09/14/2023   10:51 AM 08/27/2023   10:52 AM 08/24/2023   10:46 AM  Vitals with BMI  Height 6' 3 6' 3 6' 3  Weight 231 lbs 3 oz 243 lbs 242 lbs 10 oz  BMI 28.9 30.37 30.32  Systolic 105 112 96  Diastolic 62 61 60  Pulse 63 71 69     Physical Exam  Gen: Alert, well appearing.  Patient is oriented to person, place, time, and situation. AFFECT: pleasant, lucid thought and speech. CV: RRR, , 4/6, syst murm, no diast murm, no r/g.   LUNGS: CTA bilat, nonlabored resps, good aeration in all lung fields. EXT: no clubbing or cyanosis.  no edema.    LABS:  Last CBC Lab Results  Component Value Date   WBC 4.1 08/17/2023   HGB 14.0 08/17/2023   HCT 42.8 08/17/2023   MCV 92.6 08/17/2023   MCH 30.0  09/03/2020   RDW 14.5 08/17/2023   PLT 83.0 (L) 08/17/2023   Last metabolic panel Lab Results  Component Value Date   GLUCOSE 91 08/17/2023   NA 142 08/17/2023   K 5.1 08/17/2023   CL 109 08/17/2023   CO2 29 08/17/2023   BUN 22 08/17/2023   CREATININE 1.00 08/17/2023   GFR  71.67 08/17/2023   CALCIUM  9.0 08/17/2023   PROT 5.9 (L) 08/17/2023   ALBUMIN 4.1 08/17/2023   LABGLOB 2.7 07/06/2014   AGRATIO 1.4 07/06/2014   BILITOT 0.8 08/17/2023   ALKPHOS 60 08/17/2023   AST 23 08/17/2023   ALT 19 08/17/2023   ANIONGAP 6 09/03/2020   Last hemoglobin A1c Lab Results  Component Value Date   HGBA1C 5.0 02/27/2016   Last thyroid  functions Lab Results  Component Value Date   TSH 2.00 02/27/2016   IMPRESSION AND PLAN:  #1 acute bronchopneumonia. Resolving appropriately on Levaquin  and Airsupra .  #2 hypertension. Okay to restart valsartan  80 mg, 1-1/2 daily.  3. Moderate aortic stenosis. Stable on 03/2021 echo. Echo ordered today.  4. Fatigue. Monitor b12 today.  An After Visit Summary was printed and given to the patient.  FOLLOW UP: Return in about 3 months (around 11/27/2023).  Signed:  Gerlene Hockey, MD           10/16/2023

## 2023-08-28 LAB — VITAMIN B12: Vitamin B-12: 612 pg/mL (ref 200–1100)

## 2023-08-30 ENCOUNTER — Ambulatory Visit: Payer: Self-pay | Admitting: Family Medicine

## 2023-09-05 ENCOUNTER — Other Ambulatory Visit: Payer: Self-pay | Admitting: Family Medicine

## 2023-09-06 ENCOUNTER — Other Ambulatory Visit: Payer: Self-pay | Admitting: Family Medicine

## 2023-09-06 NOTE — Telephone Encounter (Signed)
 Copied from CRM [id number]. Topic: Clinical - Medication Refill >> Sep 06, 2023 11:13 AM Rosina BIRCH wrote: Medication: rOPINIRole  (REQUIP ) 1 MG tablet  Has the patient contacted their pharmacy? No (Agent: If no, request that the patient contact the pharmacy for the refill. If patient does not wish to contact the pharmacy document the reason why and proceed with request.) (Agent: If yes, when and what did the pharmacy advise?)  This is the patient's preferred pharmacy:  [id number] - [place], [place] - [address] [place] [address] Phone: [phone] Fax: [phone]  Is this the correct pharmacy for this prescription? Yes If no, delete pharmacy and type the correct one.   Has the prescription been filled recently? No  Is the patient out of the medication? Yes  Has the patient been seen for an appointment in the last year OR does the patient have an upcoming appointment? Yes  Can we respond through MyChart? Yes  Agent: Please be advised that Rx refills may take up to 3 business days. We ask that you follow-up with your pharmacy.

## 2023-09-13 ENCOUNTER — Other Ambulatory Visit: Payer: Self-pay

## 2023-09-13 MED ORDER — ROPINIROLE HCL 1 MG PO TABS: ORAL_TABLET | ORAL | 0 refills | Status: DC

## 2023-09-14 ENCOUNTER — Ambulatory Visit (INDEPENDENT_AMBULATORY_CARE_PROVIDER_SITE_OTHER): Admitting: Family Medicine

## 2023-09-14 ENCOUNTER — Encounter: Payer: Self-pay | Admitting: Family Medicine

## 2023-09-14 VITALS — BP 105/62 | HR 63 | Temp 97.5°F | Ht 75.0 in | Wt 231.2 lb

## 2023-09-14 DIAGNOSIS — M150 Primary generalized (osteo)arthritis: Secondary | ICD-10-CM | POA: Diagnosis not present

## 2023-09-14 DIAGNOSIS — G2581 Restless legs syndrome: Secondary | ICD-10-CM

## 2023-09-14 DIAGNOSIS — Z79899 Other long term (current) drug therapy: Secondary | ICD-10-CM

## 2023-09-14 DIAGNOSIS — F988 Other specified behavioral and emotional disorders with onset usually occurring in childhood and adolescence: Secondary | ICD-10-CM

## 2023-09-14 DIAGNOSIS — E7800 Pure hypercholesterolemia, unspecified: Secondary | ICD-10-CM

## 2023-09-14 MED ORDER — AMPHETAMINE-DEXTROAMPHET ER 30 MG PO CP24: ORAL_CAPSULE | ORAL | 0 refills | Status: DC

## 2023-09-14 MED ORDER — ROSUVASTATIN CALCIUM 10 MG PO TABS: 10.0000 mg | ORAL_TABLET | Freq: Every day | ORAL | 3 refills | Status: DC

## 2023-09-14 MED ORDER — ROPINIROLE HCL 1 MG PO TABS: ORAL_TABLET | ORAL | 3 refills | Status: AC

## 2023-09-14 MED ORDER — CELECOXIB 200 MG PO CAPS: 200.0000 mg | ORAL_CAPSULE | Freq: Every day | ORAL | 3 refills | Status: DC

## 2023-09-14 NOTE — Progress Notes (Signed)
 OFFICE VISIT  09/14/2023  CC:  Chief Complaint  Patient presents with   Medical Management of Chronic Issues    Patient is a 78 y.o. male who presents for follow-up restless legs syndrome and adult ADD.  HPI: Andrew Stevenson ran out of his ropinirole  about a week ago.  He cannot sleep at all without it due to restless legs. He takes 5 of the ropinirole  1 mg tabs every night.  Has osteoarthritic pain in multiple joints, particular the hands lately.  He has run out of his Celebrex  and asks for refill.  Pt states all is going well with the med at current dosing (Adderall XR 30 mg, 2 tabs every morning): much improved focus, concentration, task completion.  Less frustration, better multitasking, less impulsivity and restlessness.  Mood is stable. No side effects from the medication.  PMP AWARE reviewed today: most recent rx for Adderall XR 30 mg was filled 08/19/2023, # 60, rx by me. No red flags.  ROS as above, plus--> chronic dizziness/disequilibrium.   no fevers, no CP, no SOB, no wheezing, no cough, no HAs, no rashes, no melena/hematochezia.  No polyuria or polydipsia.  No myalgias or arthralgias.  No focal weakness, paresthesias, or tremors.  No acute vision or hearing abnormalities.  No dysuria or unusual/new urinary urgency or frequency.  No recent changes in lower legs. No n/v/d or abd pain.  No palpitations.    Past Medical History:  Diagnosis Date   Adult ADHD    adderall caused elevated bp   Anxiety and depression    in past/ due to back injuries   Benign paroxysmal positional vertigo    Branch retinal artery occlusion of right eye    COVID-19 virus infection 09/2020   Disequilibrium syndrome    Carotid dopplers and MRI brain NORMAL x 2 (including eighth nerve studies on most recent MRI 08/2016).   Erectile dysfunction    Essential hypertension    Failed back surgical syndrome    Dr. Alm Molt 04/16/20->ref to pain mgmt-> SPINAL CORD STIMULATOR helpful (2022)   Gross hematuria     passed a stone prior to the planned CT 10/2020   History of concussion    june 2015--  hit head w/ syncope---  no residual   History of memory loss    per prior PCP records./ per pt due to medication   History of syncope    june 2015 --syncope w/ fall and pt states has happened 5 more times , the last one being jan 2016,,  states had battery of test and unknown cause   Hx of adenomatous colonic polyps 05/25/2017   Recall 05/2022   Hyperlipidemia 02/2016   atorva 02/2016---lipid #s worse so changed to generic crestor .  Much improved on crestor .   IFG (impaired fasting glucose) 02/2016   HbA1c 5 %.   Insomnia    Moderate aortic stenosis    Plan repeat echo 03/2022   Nephrolithiasis    passed stone 10/2020   OAB (overactive bladder)    Osteoarthritis, multiple sites    C-spine, L spine, knees, all my joints basically   Prostate cancer [facility]) urologist-  dr ottelin/  oncologist-  dr jason   2016 Stage T1c,  Gleason 4+3,  PSA 5.26, vol 49.19cc.  No biochemical recurrence as of 11/2020.   RLS (restless legs syndrome)    Spinal stenosis, lumbar region with neurogenic claudication    s/p L3-S1 fusion revision 09/2018. Pain worse 05/2019->MRI showed ankylosis + surg fusion, L2-3  being the only mobile level of lower spine. SPINAL CORD STIMULATOR->helpful.   Thrombocytopenia (HCC)    >100K.  Mild, chronic   Upper airway cough syndrome 2018   Dr. Shellia.  Cough therapy maximized at f/u 07/2016; referred to ENT by Pulm.  Allergy  testing OK. Improved as of 08/05/16 pulm f/u.   Vertebrobasilar insufficiency    per neurologist note dr vear-- this is possible causing syncope-- no tx just be careful getting up from sitting position   White coat syndrome without hypertension     Past Surgical History:  Procedure Laterality Date   Carotid Dopplers  08/01/2014   NORMAL (Dr. vear)   CERVICAL FUSION  last one 2010   2 times   COLONOSCOPY  02/2004; 05/2017   2019: adenomatous polyp, int hem,  diverticulosis, radiation proctitis.  Recall 05/2022.   EYE SURGERY Bilateral 2013   5 right eye, 3 left eye surgeries (including cataract extraction's iol w/ lens implant, other surgery's for post-op lens fungal infection and floaters)   GOLD SEED IMPLANT N/A 02/22/2015   Procedure: GOLD SEED IMPLANT;  Surgeon: Mark Ottelin, MD;  Location: [facility];  Service: Urology;  Laterality: N/A;   LEFT THIGH QUADRICEP MUSCLE BIOPSY'S  12/31/2005   LUMBAR FUSION  x2  last one 2010   4 surgeries in back per pt   MRA/MRI w/out contrast--brain  12/2014   mild, age-related chronic microvascular ischemic change (Dr. vear).   RADIOACTIVE SEED IMPLANT N/A 05/17/2015   Procedure: RADIOACTIVE SEED IMPLANT/BRACHYTHERAPY IMPLANT;  Surgeon: Mark Ottelin, MD;  Location: [facility];  Service: Urology;  Laterality: N/A;   SPINAL CORD STIMULATOR IMPLANT     Spinal cord stimulator implantation  06/2020   HELPS!   TRANSRECTAL ULTRASOUND N/A 11/12/2014   Procedure: TRANSRECTAL ULTRASOUND AND BIOPSY  ;  Surgeon: Mark Ottelin, MD;  Location: [facility];  Service: Urology;  Laterality: N/A;   TRANSTHORACIC ECHOCARDIOGRAM  03/29/2014   mild concentric LVH/  ef 55-60%/  grd I DD. AV poorly visualized, possibly bicupid;  mild AS, valve area 1.78cm^2, mean grandient 16mm Hg/  trivial TR.  03/21/21: EF 60-65%, mod aortic stenosis (mean 21, peak 35, area 1.14 cm2), aortic valve is tricuspid,, mild MR, nl LV syst fxn, grd I DD.    Outpatient Medications Prior to Visit  Medication Sig Dispense Refill   Albuterol -Budesonide (AIRSUPRA ) 90-80 MCG/ACT AERO Inhale 2 puffs into the lungs 2 (two) times daily. Lot # [id number]     b complex vitamins capsule Take 1 capsule by mouth daily.     Magnesium 500 MG TABS Take 500 mg by mouth daily.     Melatonin 3 MG CAPS Take by mouth.     Multiple Vitamin (MULTIVITAMIN) tablet Take 1 tablet by mouth daily.     Omega-3 Fatty Acids (FISH OIL) 300  MG CAPS Take by mouth.     scopolamine  (TRANSDERM-SCOP) 1 MG/3DAYS Place 1 patch (1.5 mg total) onto the skin every 3 (three) days. 4 patch 1   sertraline (ZOLOFT) 50 MG tablet Take 50 mg by mouth.     tadalafil  (CIALIS ) 20 MG tablet Take 10 mg by mouth daily.     tamsulosin  (FLOMAX ) 0.4 MG CAPS capsule Take 0.4 mg by mouth daily.     TART CHERRY PO Take by mouth daily.     traZODone  (DESYREL ) 150 MG tablet Take 150 mg by mouth at bedtime as needed.     traZODone  (DESYREL ) 50 MG tablet  TAKE 1 TO 2 TABLETS BY MOUTH EVERY DAY AT BEDTIME AS NEEDED FOR INSOMNIA 180 tablet 1   valsartan  (DIOVAN ) 80 MG tablet 1 and 1/2 tabs po qd 1351 tablet 1   VITAMIN D PO Take 50,000 Units by mouth 2 (two) times daily.     benzonatate  (TESSALON ) 200 MG capsule Take 1 capsule (200 mg total) by mouth 3 (three) times daily as needed for cough. 20 capsule 0   celecoxib  (CELEBREX ) 200 MG capsule Take 1 capsule (200 mg total) by mouth daily. (Patient not taking: Reported on 09/14/2023) 90 capsule 3   gabapentin  (NEURONTIN ) 600 MG tablet Take 600 mg by mouth 2 (two) times daily. (Patient not taking: Reported on 09/14/2023)     guaiFENesin -codeine  100-10 MG/5ML syrup Take 5 mLs by mouth 2 (two) times daily as needed for cough. (Patient not taking: Reported on 08/27/2023) 100 mL 0   predniSONE  (DELTASONE ) 50 MG tablet Take 1 tablet (50 mg total) by mouth daily with breakfast. (Patient not taking: Reported on 08/27/2023) 5 tablet 0   rOPINIRole  (REQUIP ) 1 MG tablet TAKE 5 TABLETS BY MOUTH AT BEDTIME. MUST KEEP APPT FOR FURTHER REFILLS 70 tablet 0   rosuvastatin  (CRESTOR ) 10 MG tablet TAKE 1 TABLET BY MOUTH EVERY DAY 30 tablet 0   No facility-administered medications prior to visit.    Allergies  Allergen Reactions   Methylphenidate  Palpitations    Pt reported; confirmed he wanted med added to his allergy  list   Other     Narcotics;   Nightmares, hallucinations   Oxycodone  Itching and Other (See Comments)    Nightmares,  hallucinations   Penicillins Other (See Comments)    Unknown childhood reaction    Review of Systems  As per HPI  PE:    09/14/2023   10:51 AM 08/27/2023   10:52 AM 08/24/2023   10:46 AM  Vitals with BMI  Height 6' 3 6' 3 6' 3  Weight 231 lbs 3 oz 243 lbs 242 lbs 10 oz  BMI 28.9 30.37 30.32  Systolic 105 112 96  Diastolic 62 61 60  Pulse 63 71 69   Physical Exam  Gen: Alert, well appearing.  Patient is oriented to person, place, time, and situation. AFFECT: pleasant, lucid thought and speech.   LABS:  Last CBC Lab Results  Component Value Date   WBC 4.1 08/17/2023   HGB 14.0 08/17/2023   HCT 42.8 08/17/2023   MCV 92.6 08/17/2023   MCH 30.0 09/03/2020   RDW 14.5 08/17/2023   PLT 83.0 (L) 08/17/2023   Last metabolic panel Lab Results  Component Value Date   GLUCOSE 91 08/17/2023   NA 142 08/17/2023   K 5.1 08/17/2023   CL 109 08/17/2023   CO2 29 08/17/2023   BUN 22 08/17/2023   CREATININE 1.00 08/17/2023   GFR 71.67 08/17/2023   CALCIUM  9.0 08/17/2023   PROT 5.9 (L) 08/17/2023   ALBUMIN 4.1 08/17/2023   LABGLOB 2.7 07/06/2014   AGRATIO 1.4 07/06/2014   BILITOT 0.8 08/17/2023   ALKPHOS 60 08/17/2023   AST 23 08/17/2023   ALT 19 08/17/2023   ANIONGAP 6 09/03/2020   Last lipids Lab Results  Component Value Date   CHOL 132 02/20/2022   HDL 59.80 02/20/2022   LDLCALC 54 02/20/2022   LDLDIRECT 98.0 08/20/2016   TRIG 89.0 02/20/2022   CHOLHDL 2 02/20/2022   Last hemoglobin A1c Lab Results  Component Value Date   HGBA1C 5.0 02/27/2016   Last thyroid   functions Lab Results  Component Value Date   TSH 2.00 02/27/2016   IMPRESSION AND PLAN:  #1 restless legs syndrome. Restart Requip  1 mg tabs, 5 tabs nightly.  90-day supply with 3 refills.  2.  Hypercholesterolemia, doing well long-term on Crestor  10 mg a day. Not fasting today.  Will repeat lipids at next follow-up. Hepatic panel 1 month ago normal.  3.  Adult ADD. Doing well long-term on  Adderall XR 30 mg, 2 tabs daily.  1 month supply prescription sent today.  4.  Osteoarthritis, multiple sites. Refilled Celebrex  200 mg, 1 daily as needed.  He leaves in about a week to go to [place] for his daughter's wedding.  An After Visit Summary was printed and given to the patient.  FOLLOW UP: Return in about 6 months (around 03/16/2024) for routine chronic illness f/u.  Signed:  Gerlene Hockey, MD           09/14/2023

## 2023-09-20 ENCOUNTER — Telehealth: Payer: Self-pay

## 2023-09-20 NOTE — Telephone Encounter (Signed)
 Copied from CRM [id number]. Topic: Clinical - Medication Question >> Sep 20, 2023 12:45 PM Zenovia PARAS wrote: Reason for CRM: Spouse Diane requesting a call to discuss medications amphetamine -dextroamphetamine  (ADDERALL XR) 30 MG 24 hr capsule and rOPINIRole  (REQUIP ) 1 MG tablet

## 2023-09-20 NOTE — Telephone Encounter (Signed)
 Spoke with pt's wife, Diane regarding medication refills. Pharmacy told her both still needed PCP approval. CMA called pharmacy and confirmed both refills were received. Adderall is ready to be picked up but Requip  was picked up on 7/1 for quantity of 70. Insurance will not cover for 90 day supply before their trip. Advised her to contact the pharmacy, she may have to pay out of pocket.

## 2023-10-11 ENCOUNTER — Encounter: Payer: Self-pay | Admitting: Family Medicine

## 2023-11-04 DIAGNOSIS — M546 Pain in thoracic spine: Secondary | ICD-10-CM | POA: Diagnosis not present

## 2023-11-05 ENCOUNTER — Encounter: Payer: Self-pay | Admitting: Pharmacist

## 2023-11-05 ENCOUNTER — Other Ambulatory Visit (HOSPITAL_COMMUNITY): Payer: Self-pay | Admitting: Student

## 2023-11-05 DIAGNOSIS — M546 Pain in thoracic spine: Secondary | ICD-10-CM

## 2023-11-05 NOTE — Progress Notes (Signed)
 Pharmacy Quality Measure Review  This patient is appearing on a report for being at risk of failing the adherence measure for cholesterol (statin) medications this calendar year.   Medication: rosuvastatin   Last fill date: 09/14/2023 for 90 day supply but this refill is not showing in the MedHistory database.  He filled rosuvastatin  two other times in 2025 - 04/05/2023 and 05/05/2023 for 30 day supply only.   I called [facility] and they confirmed that he filled rosuvastatin  for 90 day supply on 09/14/2023, it was picked up and it was run on his Part D plan.  Next refill due 12/14/2023 - on auto refill.    Insurance report was not up to date. No action needed at this time.

## 2023-11-10 ENCOUNTER — Encounter (HOSPITAL_COMMUNITY): Payer: Self-pay

## 2023-11-10 ENCOUNTER — Ambulatory Visit (HOSPITAL_COMMUNITY)
Admission: RE | Admit: 2023-11-10 | Discharge: 2023-11-10 | Disposition: A | Discharge status: Home / Self Care | Source: Ambulatory Visit | Attending: Student | Admitting: Student

## 2023-11-10 DIAGNOSIS — M546 Pain in thoracic spine: Secondary | ICD-10-CM

## 2023-11-19 ENCOUNTER — Ambulatory Visit (HOSPITAL_COMMUNITY)
Admission: RE | Admit: 2023-11-19 | Discharge: 2023-11-19 | Disposition: A | Discharge status: Home / Self Care | Source: Ambulatory Visit | Attending: Student | Admitting: Student

## 2023-11-19 DIAGNOSIS — M546 Pain in thoracic spine: Secondary | ICD-10-CM

## 2023-11-23 DIAGNOSIS — M542 Cervicalgia: Secondary | ICD-10-CM | POA: Diagnosis not present

## 2023-11-30 DIAGNOSIS — M542 Cervicalgia: Secondary | ICD-10-CM | POA: Insufficient documentation

## 2023-12-07 HISTORY — PX: NECK SURGERY: SHX720

## 2023-12-15 DIAGNOSIS — Z981 Arthrodesis status: Secondary | ICD-10-CM | POA: Diagnosis not present

## 2023-12-15 DIAGNOSIS — M4722 Other spondylosis with radiculopathy, cervical region: Secondary | ICD-10-CM | POA: Diagnosis not present

## 2023-12-15 DIAGNOSIS — M5011 Cervical disc disorder with radiculopathy,  high cervical region: Secondary | ICD-10-CM | POA: Diagnosis not present

## 2023-12-15 DIAGNOSIS — M542 Cervicalgia: Secondary | ICD-10-CM | POA: Diagnosis not present

## 2023-12-15 DIAGNOSIS — M4802 Spinal stenosis, cervical region: Secondary | ICD-10-CM | POA: Diagnosis not present

## 2024-01-04 DIAGNOSIS — M542 Cervicalgia: Secondary | ICD-10-CM | POA: Diagnosis not present

## 2024-01-04 DIAGNOSIS — M5412 Radiculopathy, cervical region: Secondary | ICD-10-CM | POA: Diagnosis not present

## 2024-01-17 ENCOUNTER — Ambulatory Visit: Payer: Self-pay

## 2024-01-17 ENCOUNTER — Other Ambulatory Visit: Payer: Self-pay | Admitting: Family Medicine

## 2024-01-17 NOTE — Telephone Encounter (Signed)
 FYI Only or Action Required?: FYI only for provider: appointment scheduled on 01/18/24.  Patient was last seen in primary care on 09/14/2023 by McGowen, Aleene DEL, MD.  Called Nurse Triage reporting Neck Pain.  Symptoms began several years ago.  Interventions attempted: Other: nothing additional at this time.  Symptoms are: gradually worsening.  Triage Disposition: See Physician Within 24 Hours (overriding See HCP Within 4 Hours (Or PCP Triage))  Patient/caregiver understands and will follow disposition?: Yes                            Copied from CRM [id number]. Topic: Clinical - Red Word Triage >> Jan 17, 2024 11:17 AM Thersia BROCKS wrote: [facility] that prompted transfer to Nurse Triage: patient stated in terrible pain in his neck , patient stated he has had about 10 surgerys in back and needs to speak with Dr.McGowen on what to do. Hurts so bad he cant move    ----------------------------------------------------------------------- From previous Reason for Contact - Scheduling: Patient/patient representative is calling to schedule an appointment. Refer to attachments for appointment information. Reason for Disposition  [1] SEVERE neck pain (e.g., excruciating, unable to do any normal activities) AND [2] not improved after 2 hours of pain medicine  Answer Assessment - Initial Assessment Questions 1. ONSET: When did the pain begin?      A couple of years ago and worsening  2. LOCATION: Where does it hurt?      Starts at base of skull and radiates to back 3. PATTERN Does the pain come and go, or has it been constant since it started?      Constant 4. SEVERITY: How bad is the pain?  (Scale 0-10; or none or slight stiffness, mild, moderate, severe)     Rates pain an 11, states pain is relatively controlled when he keeps neck still 5. RADIATION: Does the pain go anywhere else, shoot into your arms?     Radiates to back  6. CORD SYMPTOMS: Any weakness or  numbness of the arms or legs?     States he cannot feel his feet, but this has been ongoing  7. CAUSE: What do you think is causing the neck pain?     Unsure, states he has a history of 9 prior back surgeries 8. NECK OVERUSE: Any recent activities that involved turning or twisting the neck?     Denies, states he has been extremely careful 9. OTHER SYMPTOMS: Do you have any other symptoms? (e.g., headache, fever, chest pain, difficulty breathing, neck swelling)     Limited ROM in neck, denies difficulty breathing- states intense pain can make breathing difficult at times- patient able to speak in clear and complete sentences while on phone with this RN, dizziness ongoing for several years 10. PREGNANCY: Is there any chance you are pregnant? When was your last menstrual period?     N/A    This RN scheduled patient for first available appointment with PCP tomorrow morning.  Protocols used: Neck Pain or Stiffness-A-AH

## 2024-01-17 NOTE — Telephone Encounter (Signed)
 Pt has scheduled appt tomorrow with PCP.

## 2024-01-18 ENCOUNTER — Ambulatory Visit: Admitting: Family Medicine

## 2024-01-18 ENCOUNTER — Encounter: Payer: Self-pay | Admitting: Family Medicine

## 2024-01-18 VITALS — BP 97/61 | HR 60 | Temp 97.7°F | Ht 75.0 in | Wt 227.0 lb

## 2024-01-18 DIAGNOSIS — F988 Other specified behavioral and emotional disorders with onset usually occurring in childhood and adolescence: Secondary | ICD-10-CM | POA: Diagnosis not present

## 2024-01-18 DIAGNOSIS — M542 Cervicalgia: Secondary | ICD-10-CM

## 2024-01-18 DIAGNOSIS — G8929 Other chronic pain: Secondary | ICD-10-CM

## 2024-01-18 DIAGNOSIS — Z9889 Other specified postprocedural states: Secondary | ICD-10-CM

## 2024-01-18 DIAGNOSIS — M792 Neuralgia and neuritis, unspecified: Secondary | ICD-10-CM | POA: Diagnosis not present

## 2024-01-18 DIAGNOSIS — I10 Essential (primary) hypertension: Secondary | ICD-10-CM

## 2024-01-18 MED ORDER — VALSARTAN 80 MG PO TABS: ORAL_TABLET | ORAL | 3 refills | Status: AC

## 2024-01-18 MED ORDER — DULOXETINE HCL 30 MG PO CPEP: 30.0000 mg | ORAL_CAPSULE | Freq: Every day | ORAL | 3 refills | Status: AC

## 2024-01-18 MED ORDER — AMPHETAMINE-DEXTROAMPHET ER 30 MG PO CP24: ORAL_CAPSULE | ORAL | 0 refills | Status: DC

## 2024-01-18 MED ORDER — BACLOFEN 10 MG PO TABS: ORAL_TABLET | ORAL | 0 refills | Status: DC

## 2024-01-18 NOTE — Patient Instructions (Signed)
 I sent in a prescription for you to restart Cymbalta and also sent a prescription for a muscle relaxer that like you to take every night before bedtime.  Make sure you are no longer taking any Robaxin  (methocarbamol ).

## 2024-01-18 NOTE — Progress Notes (Signed)
 OFFICE VISIT  01/18/2024  CC:  Chief Complaint  Patient presents with   Neck Pain    Worsening; very limited ROM; pain radiating into mid back.    Patient is a 78 y.o. male who presents for neck pain.  HPI: Severe neck pain. Melik has a long history of neck pain and has had a couple of neck surgeries (fusion) in the remote past.  His pain had gradually been progressing over the last several years and he ended up getting another neck surgery about 6 weeks ago, Dr. Joshua. He says his pain has not changed any compared to prior to surgery. He has followed up with Dr. Joshua since the surgery.  He has taken some oxycodone  but it did not help at all. Kaoru says he felt addicted when he was on the medication and thinks he may have had some withdrawal symptoms when he got off of it.  He has not taken this medication in 2 to 3 weeks now. He does not want to get back on any opioids. He thinks he has taken gabapentin  in the past but he is not sure.   Robaxin  in the past did not help much, if any.  He describes the pain as constant all over the neck and then he has paroxysms of severe pain that go across both shoulders and across the torso, mainly the right side.  These feel like extreme shooting nerve pains.  No upper extremity weakness or paresthesias.  He does have a history of some PTSD, was treated with sertraline in the relatively recent past but he does not think he is taking it anymore. Most recently he has been on Cymbalta but says he has not taken this recently as well.  He is taking valsartan  80 mg a day for blood pressure.  He has chronic disequilibrium syndrome which is unchanged lately.  Adult ADD: Pt states all is going well with the med at current dosing (Adderall XR 30 mg, 2 tabs every morning): much improved focus, concentration, task completion.  Less frustration, better multitasking, less impulsivity and restlessness.  Mood is stable. No side effects from the  medication.  PMP AWARE reviewed today: most recent rx for oxycodone  10 mg was filled 01/04/2024, # 30, rx by Dr. Alm Joshua.  Most recent Adderall XR 30 mg prescription was filled 09/20/2023, #60, prescription by me. No red flags.  Past Medical History:  Diagnosis Date   Adult ADHD    adderall caused elevated bp   Anxiety and depression    in past/ due to back injuries   Aortic stenosis, moderate    Benign paroxysmal positional vertigo    Branch retinal artery occlusion of right eye    COVID-19 virus infection 09/2020   Disequilibrium syndrome    Carotid dopplers and MRI brain NORMAL x 2 (including eighth nerve studies on most recent MRI 08/2016).   Erectile dysfunction    Essential hypertension    Failed back surgical syndrome    Dr. Alm Joshua 04/16/20->ref to pain mgmt-> SPINAL CORD STIMULATOR helpful (2022)   Gross hematuria    passed a stone prior to the planned CT 10/2020   History of concussion    june 2015--  hit head w/ syncope---  no residual   History of memory loss    per prior PCP records./ per pt due to medication   History of syncope    june 2015 --syncope w/ fall and pt states has happened 5 more times , the  last one being jan 2016,,  states had battery of test and unknown cause   Hx of adenomatous colonic polyps 05/25/2017   Recall 05/2022   Hyperlipidemia 02/2016   atorva 02/2016---lipid #s worse so changed to generic crestor .  Much improved on crestor .   IFG (impaired fasting glucose) 02/2016   HbA1c 5 %.   Insomnia    Moderate aortic stenosis    Plan repeat echo 03/2022   Nephrolithiasis    passed stone 10/2020   OAB (overactive bladder)    Osteoarthritis, multiple sites    C-spine, L spine, knees, all my joints basically   Prostate cancer [facility]) urologist-  dr ottelin/  oncologist-  dr jason   2016 Stage T1c,  Gleason 4+3,  PSA 5.26, vol 49.19cc.  No biochemical recurrence as of 11/2020.   RLS (restless legs syndrome)    Spinal stenosis, lumbar region with  neurogenic claudication    s/p L3-S1 fusion revision 09/2018. Pain worse 05/2019->MRI showed ankylosis + surg fusion, L2-3 being the only mobile level of lower spine. SPINAL CORD STIMULATOR->helpful.   Thrombocytopenia    >100K.  Mild, chronic   Upper airway cough syndrome 2018   Dr. Shellia.  Cough therapy maximized at f/u 07/2016; referred to ENT by Pulm.  Allergy  testing OK. Improved as of 08/05/16 pulm f/u.   Vertebrobasilar insufficiency    per neurologist note dr vear-- this is possible causing syncope-- no tx just be careful getting up from sitting position   White coat syndrome without hypertension     Past Surgical History:  Procedure Laterality Date   Carotid Dopplers  08/01/2014   NORMAL (Dr. Vear)   CERVICAL FUSION  last one 2010   2 times   COLONOSCOPY  02/2004; 05/2017   2019: adenomatous polyp, int hem, diverticulosis, radiation proctitis.  Recall 05/2022.   EYE SURGERY Bilateral 2013   5 right eye, 3 left eye surgeries (including cataract extraction's iol w/ lens implant, other surgery's for post-op lens fungal infection and floaters)   GOLD SEED IMPLANT N/A 02/22/2015   Procedure: GOLD SEED IMPLANT;  Surgeon: Mark Ottelin, MD;  Location: [facility];  Service: Urology;  Laterality: N/A;   LEFT THIGH QUADRICEP MUSCLE BIOPSY'S  12/31/2005   LUMBAR FUSION  x2  last one 2010   4 surgeries in back per pt   MRA/MRI w/out contrast--brain  12/2014   mild, age-related chronic microvascular ischemic change (Dr. Vear).   NECK SURGERY  12/07/2023   RADIOACTIVE SEED IMPLANT N/A 05/17/2015   Procedure: RADIOACTIVE SEED IMPLANT/BRACHYTHERAPY IMPLANT;  Surgeon: Mark Ottelin, MD;  Location: [facility];  Service: Urology;  Laterality: N/A;   SPINAL CORD STIMULATOR IMPLANT     Spinal cord stimulator implantation  06/2020   HELPS!   TRANSRECTAL ULTRASOUND N/A 11/12/2014   Procedure: TRANSRECTAL ULTRASOUND AND BIOPSY  ;  Surgeon: Mark Ottelin, MD;  Location[facility];  Service: Urology;  Laterality: N/A;   TRANSTHORACIC ECHOCARDIOGRAM  03/29/2014   mild concentric LVH/  ef 55-60%/  grd I DD. AV poorly visualized, possibly bicupid;  mild AS, valve area 1.78cm^2, mean grandient 16mm Hg/  trivial TR.  03/21/21: EF 60-65%, mod aortic stenosis (mean 21, peak 35, area 1.14 cm2), aortic valve is tricuspid,, mild MR, nl LV syst fxn, grd I DD.    Outpatient Medications Prior to Visit  Medication Sig Dispense Refill   Albuterol -Budesonide (AIRSUPRA ) 90-80 MCG/ACT AERO Inhale 2 puffs into the lungs 2 (two) times  daily. Lot # [id number]     b complex vitamins capsule Take 1 capsule by mouth daily.     Magnesium 500 MG TABS Take 500 mg by mouth daily.     Melatonin 3 MG CAPS Take by mouth.     Multiple Vitamin (MULTIVITAMIN) tablet Take 1 tablet by mouth daily.     Omega-3 Fatty Acids (FISH OIL) 300 MG CAPS Take by mouth.     rOPINIRole  (REQUIP ) 1 MG tablet TAKE 5 TABLETS BY MOUTH AT BEDTIME 450 tablet 3   TART CHERRY PO Take by mouth daily.     traZODone  (DESYREL ) 50 MG tablet TAKE 1 TO 2 TABLETS BY MOUTH EVERY DAY AT BEDTIME AS NEEDED FOR INSOMNIA 180 tablet 1   VITAMIN D PO Take 50,000 Units by mouth 2 (two) times daily.     amphetamine -dextroamphetamine  (ADDERALL XR) 30 MG 24 hr capsule 2 tabs po qAM 60 capsule 0   celecoxib  (CELEBREX ) 200 MG capsule Take 1 capsule (200 mg total) by mouth daily. 90 capsule 3   rosuvastatin  (CRESTOR ) 10 MG tablet Take 1 tablet (10 mg total) by mouth daily. 90 tablet 3   sertraline (ZOLOFT) 50 MG tablet Take 50 mg by mouth.     tadalafil  (CIALIS ) 20 MG tablet Take 10 mg by mouth daily.     tamsulosin  (FLOMAX ) 0.4 MG CAPS capsule Take 0.4 mg by mouth daily.     traZODone  (DESYREL ) 150 MG tablet Take 150 mg by mouth at bedtime as needed.     valsartan  (DIOVAN ) 80 MG tablet 1 and 1/2 tabs po qd 1351 tablet 1   methocarbamol  (ROBAXIN ) 500 MG tablet Take 500 mg by mouth 4 (four) times daily. (Patient not taking:  Reported on 01/18/2024)     scopolamine  (TRANSDERM-SCOP) 1 MG/3DAYS Place 1 patch (1.5 mg total) onto the skin every 3 (three) days. (Patient not taking: Reported on 01/18/2024) 4 patch 1   No facility-administered medications prior to visit.    Allergies  Allergen Reactions   Methylphenidate  Palpitations    Pt reported; confirmed he wanted med added to his allergy  list   Other     Narcotics;   Nightmares, hallucinations   Oxycodone  Itching and Other (See Comments)    Nightmares, hallucinations   Penicillins Other (See Comments)    Unknown childhood reaction    Review of Systems  As per HPI  PE:    01/18/2024   11:11 AM 09/14/2023   10:51 AM 08/27/2023   10:52 AM  Vitals with BMI  Height 6' 3 6' 3 6' 3  Weight 227 lbs 231 lbs 3 oz 243 lbs  BMI 28.37 28.9 30.37  Systolic 97 105 112  Diastolic 61 62 61  Pulse 60 63 71     Physical Exam  General: Alert and well-appearing. Affect is pleasant, thought and speech are lucid. No further exam today  LABS:  Last CBC Lab Results  Component Value Date   WBC 4.1 08/17/2023   HGB 14.0 08/17/2023   HCT 42.8 08/17/2023   MCV 92.6 08/17/2023   MCH 30.0 09/03/2020   RDW 14.5 08/17/2023   PLT 83.0 (L) 08/17/2023   Last metabolic panel Lab Results  Component Value Date   GLUCOSE 91 08/17/2023   NA 142 08/17/2023   K 5.1 08/17/2023   CL 109 08/17/2023   CO2 29 08/17/2023   BUN 22 08/17/2023   CREATININE 1.00 08/17/2023   GFR 71.67 08/17/2023   CALCIUM  9.0 08/17/2023  PROT 5.9 (L) 08/17/2023   ALBUMIN 4.1 08/17/2023   LABGLOB 2.7 07/06/2014   AGRATIO 1.4 07/06/2014   BILITOT 0.8 08/17/2023   ALKPHOS 60 08/17/2023   AST 23 08/17/2023   ALT 19 08/17/2023   ANIONGAP 6 09/03/2020   Last lipids Lab Results  Component Value Date   CHOL 132 02/20/2022   HDL 59.80 02/20/2022   LDLCALC 54 02/20/2022   LDLDIRECT 98.0 08/20/2016   TRIG 89.0 02/20/2022   CHOLHDL 2 02/20/2022   Last hemoglobin A1c Lab Results   Component Value Date   HGBA1C 5.0 02/27/2016   Last thyroid  functions Lab Results  Component Value Date   TSH 2.00 02/27/2016   Last vitamin B12 and Folate Lab Results  Component Value Date   VITAMINB12 612 08/27/2023   Lab Results  Component Value Date   PSA 0.019 11/20/2020   PSA 0.021 11/22/2019   PSA 0.03 (L) 12/19/2018   IMPRESSION AND PLAN:  #1 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  Will need to get records for details. He has had a spinal stimulator but I believe this was placed in the lumbar region. He understands his pain will be chronic but he hopes to find something that helps a little bit. No response to opioids, plus he wants to avoid this medication. Gabapentin  no help. Questionable response to muscle relaxer (Robaxin ).  Will do a trial of baclofen 10 mg nightly.  Also, we will get him restarted on his duloxetine 30 mg a day.  2.  PTSD, history of depression. From a psychologic/mental health standpoint, we need to get him into therapy again because his previous provider has relocated. Will see if we can get him in with the psychologist that we will be starting at our office in a couple of months. Restart Cymbalta 30 mg a day as noted above in #1.  #3 adult ADD. Continue Adderall XR, two 30 mg tabs daily for his ADD.  #4 hypertension, well-controlled on valsartan  80 mg a day.  An After Visit Summary was printed and given to the patient.  FOLLOW UP: Appointment at his convenience for CPE and follow-up today's problems aortic  Signed:  Gerlene Hockey, MD           01/18/2024

## 2024-01-20 ENCOUNTER — Other Ambulatory Visit: Payer: Self-pay | Admitting: Student

## 2024-01-20 DIAGNOSIS — M542 Cervicalgia: Secondary | ICD-10-CM

## 2024-01-26 ENCOUNTER — Ambulatory Visit
Admission: RE | Admit: 2024-01-26 | Discharge: 2024-01-26 | Disposition: A | Discharge status: Home / Self Care | Source: Ambulatory Visit | Attending: Student | Admitting: Student

## 2024-01-26 DIAGNOSIS — M542 Cervicalgia: Secondary | ICD-10-CM | POA: Diagnosis not present

## 2024-01-26 DIAGNOSIS — M4802 Spinal stenosis, cervical region: Secondary | ICD-10-CM | POA: Diagnosis not present

## 2024-02-02 ENCOUNTER — Encounter: Admitting: Family Medicine

## 2024-02-02 ENCOUNTER — Ambulatory Visit: Payer: Self-pay

## 2024-02-02 NOTE — Telephone Encounter (Signed)
 FYI Only or Action Required?: Action required by provider: clinical question for provider.  Patient was last seen in primary care on 01/18/2024 by McGowen, Aleene DEL, MD.  Called Nurse Triage reporting Neck Pain.  Symptoms began several weeks ago.  Interventions attempted: Rest, hydration, or home remedies.  Symptoms are: unchanged.  Triage Disposition: See HCP Within 4 Hours (Or PCP Triage)  Patient/caregiver understands and will follow disposition?:   Copied from CRM [id number]. Topic: Clinical - Red Word Triage >> Feb 02, 2024 11:34 AM Rosina BIRCH wrote: Red Word that prompted transfer to Nurse Triage: having a lot of neck pain and he can not turn his head Reason for Disposition  [1] SEVERE neck pain (e.g., excruciating, unable to do any normal activities) AND [2] not improved after 2 hours of pain medicine  Answer Assessment - Initial Assessment Questions Patient with hx of chronic neck pain-endorses not being able to turn his head certain ways. Patient is under the care of a back surgeon. Patient recently had back surgery six weeks ago. Scheduled to see his back surgeon tomorrow to get the results of his MRI. Patient rescheduled his physical appointment for 11/24. Patient states he doesn't feel comfortable to drive anywhere today. Asking if provider has any recommendations for him currently.   1. ONSET: When did the pain begin?      Reports chronic neck pain which has worsened over the last couple of weeks.  2. LOCATION: Where does it hurt?      Back of neck to the right side 3. PATTERN Does the pain come and go, or has it been constant since it started?      constant 4. SEVERITY: How bad is the pain?  (Scale 0-10; or none or slight stiffness, mild, moderate, severe)     10 5. RADIATION: Does the pain go anywhere else, shoot into your arms?     Goes into right shoulder 6. CORD SYMPTOMS: Any weakness or numbness of the arms or legs?     no 7. CAUSE: What do you think is  causing the neck pain?     Unsure-awaiting results from an MRI 8. NECK OVERUSE: Any recent activities that involved turning or twisting the neck?     no 9. OTHER SYMPTOMS: Do you have any other symptoms? (e.g., headache, fever, chest pain, difficulty breathing, neck swelling)     headache  Protocols used: Neck Pain or Stiffness-A-AH

## 2024-02-02 NOTE — Progress Notes (Deleted)
 Office Note 02/02/2024  CC: No chief complaint on file.   HPI:  Patient is a 78 y.o. male who is here for annual health maintenance exam and ***.  Past Medical History:  Diagnosis Date   Adult ADHD    adderall caused elevated bp   Anxiety and depression    in past/ due to back injuries   Aortic stenosis, moderate    Benign paroxysmal positional vertigo    Branch retinal artery occlusion of right eye    COVID-19 virus infection 09/2020   Disequilibrium syndrome    Carotid dopplers and MRI brain NORMAL x 2 (including eighth nerve studies on most recent MRI 08/2016).   Erectile dysfunction    Essential hypertension    Failed back surgical syndrome    Dr. Alm Molt 04/16/20->ref to pain mgmt-> SPINAL CORD STIMULATOR helpful (2022)   Gross hematuria    passed a stone prior to the planned CT 10/2020   History of concussion    june 2015--  hit head w/ syncope---  no residual   History of memory loss    per prior PCP records./ per pt due to medication   History of syncope    june 2015 --syncope w/ fall and pt states has happened 5 more times , the last one being jan 2016,,  states had battery of test and unknown cause   Hx of adenomatous colonic polyps 05/25/2017   Recall 05/2022   Hyperlipidemia 02/2016   atorva 02/2016---lipid #s worse so changed to generic crestor .  Much improved on crestor .   IFG (impaired fasting glucose) 02/2016   HbA1c 5 %.   Insomnia    Moderate aortic stenosis    Plan repeat echo 03/2022   Nephrolithiasis    passed stone 10/2020   OAB (overactive bladder)    Osteoarthritis, multiple sites    C-spine, L spine, knees, all my joints basically   Prostate cancer [facility]) urologist-  dr ottelin/  oncologist-  dr jason   2016 Stage T1c,  Gleason 4+3,  PSA 5.26, vol 49.19cc.  No biochemical recurrence as of 11/2020.   RLS (restless legs syndrome)    Spinal stenosis, lumbar region with neurogenic claudication    s/p L3-S1 fusion revision 09/2018. Pain worse  05/2019->MRI showed ankylosis + surg fusion, L2-3 being the only mobile level of lower spine. SPINAL CORD STIMULATOR->helpful.   Thrombocytopenia    >100K.  Mild, chronic   Upper airway cough syndrome 2018   Dr. Shellia.  Cough therapy maximized at f/u 07/2016; referred to ENT by Pulm.  Allergy  testing OK. Improved as of 08/05/16 pulm f/u.   Vertebrobasilar insufficiency    per neurologist note dr vear-- this is possible causing syncope-- no tx just be careful getting up from sitting position   White coat syndrome without hypertension     Past Surgical History:  Procedure Laterality Date   Carotid Dopplers  08/01/2014   NORMAL (Dr. Vear)   CERVICAL FUSION  last one 2010   2 times   COLONOSCOPY  02/2004; 05/2017   2019: adenomatous polyp, int hem, diverticulosis, radiation proctitis.  Recall 05/2022.   EYE SURGERY Bilateral 2013   5 right eye, 3 left eye surgeries (including cataract extraction's iol w/ lens implant, other surgery's for post-op lens fungal infection and floaters)   GOLD SEED IMPLANT N/A 02/22/2015   Procedure: GOLD SEED IMPLANT;  Surgeon: Mark Ottelin, MD;  Location: [facility];  Service: Urology;  Laterality: N/A;   LEFT THIGH  QUADRICEP MUSCLE BIOPSY'S  12/31/2005   LUMBAR FUSION  x2  last one 2010   4 surgeries in back per pt   MRA/MRI w/out contrast--brain  12/2014   mild, age-related chronic microvascular ischemic change (Dr. Vear).   NECK SURGERY  12/07/2023   RADIOACTIVE SEED IMPLANT N/A 05/17/2015   Procedure: RADIOACTIVE SEED IMPLANT/BRACHYTHERAPY IMPLANT;  Surgeon: Mark Ottelin, MD;  Location: [facility];  Service: Urology;  Laterality: N/A;   SPINAL CORD STIMULATOR IMPLANT     Spinal cord stimulator implantation  06/2020   HELPS!   TRANSRECTAL ULTRASOUND N/A 11/12/2014   Procedure: TRANSRECTAL ULTRASOUND AND BIOPSY  ;  Surgeon: Mark Ottelin, MD;  Location: [facility];  Service: Urology;  Laterality: N/A;    TRANSTHORACIC ECHOCARDIOGRAM  03/29/2014   mild concentric LVH/  ef 55-60%/  grd I DD. AV poorly visualized, possibly bicupid;  mild AS, valve area 1.78cm^2, mean grandient 16mm Hg/  trivial TR.  03/21/21: EF 60-65%, mod aortic stenosis (mean 21, peak 35, area 1.14 cm2), aortic valve is tricuspid,, mild MR, nl LV syst fxn, grd I DD.    Family History  Problem Relation Age of Onset   Hypertension Mother    Alcohol abuse Father    Arthritis Father    Lung cancer Father        smoker   Diabetes Brother    Alcohol abuse Maternal Grandfather    Stroke Maternal Grandfather    Cancer Paternal Grandmother    Alcohol abuse Paternal Grandfather    Lung cancer Brother        smoker    Social History   Socioeconomic History   Marital status: Married    Spouse name: Not on file   Number of children: Not on file   Years of education: Not on file   Highest education level: Not on file  Occupational History   Occupation: UNEMPLOYED  Tobacco Use   Smoking status: Some Days    Types: Cigarettes, Cigars   Smokeless tobacco: Former    Types: Snuff    Quit date: 04/12/2018   Tobacco comments:    using snuff x 25 years  Vaping Use   Vaping status: Never Used  Substance and Sexual Activity   Alcohol use: Yes    Alcohol/week: 0.0 standard drinks of alcohol    Comment: rare   Drug use: No   Sexual activity: Not Currently  Other Topics Concern   Not on file  Social History Narrative   Married, 1 daughter.   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   No tob.   Occ alcohol.   Social Drivers of Corporate Investment Banker Strain: Low Risk  (04/28/2023)   Overall Financial Resource Strain (CARDIA)    Difficulty of Paying Living Expenses: Not hard at all  Food Insecurity: No Food Insecurity (04/28/2023)   Hunger Vital Sign    Worried About Running Out of Food in the Last Year: Never true    Ran Out of Food in the Last Year: Never true  Transportation  Needs: No Transportation Needs (04/28/2023)   PRAPARE - Administrator, Civil Service (Medical): No    Lack of Transportation (Non-Medical): No  Physical Activity: Inactive (04/28/2023)   Exercise Vital Sign    Days of Exercise per Week: 0 days    Minutes of Exercise per Session: 0 min  Stress: Stress Concern Present (04/28/2023)   [employer]  of Occupational Health - Occupational Stress Questionnaire    Feeling of Stress : To some extent  Social Connections: Moderately Isolated (04/28/2023)   Social Connection and Isolation Panel    Frequency of Communication with Friends and Family: More than three times a week    Frequency of Social Gatherings with Friends and Family: Never    Attends Religious Services: Never    Database Administrator or Organizations: No    Attends Banker Meetings: Never    Marital Status: Married  Catering Manager Violence: Not At Risk (04/28/2023)   Humiliation, Afraid, Rape, and Kick questionnaire    Fear of Current or Ex-Partner: No    Emotionally Abused: No    Physically Abused: No    Sexually Abused: No    Outpatient Medications Prior to Visit  Medication Sig Dispense Refill   Albuterol -Budesonide (AIRSUPRA ) 90-80 MCG/ACT AERO Inhale 2 puffs into the lungs 2 (two) times daily. Lot # [id number]     amphetamine -dextroamphetamine  (ADDERALL XR) 30 MG 24 hr capsule 2 tabs po qAM 60 capsule 0   b complex vitamins capsule Take 1 capsule by mouth daily.     baclofen  (LIORESAL ) 10 MG tablet 1 tab po at bedtime for neck pain 30 each 0   DULoxetine  (CYMBALTA ) 30 MG capsule Take 1 capsule (30 mg total) by mouth daily. 90 capsule 3   Magnesium 500 MG TABS Take 500 mg by mouth daily.     Melatonin 3 MG CAPS Take by mouth.     Multiple Vitamin (MULTIVITAMIN) tablet Take 1 tablet by mouth daily.     Omega-3 Fatty Acids (FISH OIL) 300 MG CAPS Take by mouth.     rOPINIRole  (REQUIP ) 1 MG tablet TAKE 5 TABLETS BY MOUTH AT BEDTIME 450 tablet 3    TART CHERRY PO Take by mouth daily.     traZODone  (DESYREL ) 50 MG tablet TAKE 1 TO 2 TABLETS BY MOUTH EVERY DAY AT BEDTIME AS NEEDED FOR INSOMNIA 180 tablet 1   valsartan  (DIOVAN ) 80 MG tablet 1 tab po qd 90 tablet 3   VITAMIN D PO Take 50,000 Units by mouth 2 (two) times daily.     No facility-administered medications prior to visit.    Allergies  Allergen Reactions   Methylphenidate  Palpitations    Pt reported; confirmed he wanted med added to his allergy  list   Other     Narcotics;   Nightmares, hallucinations   Oxycodone  Itching and Other (See Comments)    Nightmares, hallucinations   Penicillins Other (See Comments)    Unknown childhood reaction    Review of Systems *** PE;    01/18/2024   11:11 AM 09/14/2023   10:51 AM 08/27/2023   10:52 AM  Vitals with BMI  Height 6' 3 6' 3 6' 3  Weight 227 lbs 231 lbs 3 oz 243 lbs  BMI 28.37 28.9 30.37  Systolic 97 105 112  Diastolic 61 62 61  Pulse 60 63 71     *** Pertinent labs:  Lab Results  Component Value Date   TSH 2.00 02/27/2016   Lab Results  Component Value Date   WBC 4.1 08/17/2023   HGB 14.0 08/17/2023   HCT 42.8 08/17/2023   MCV 92.6 08/17/2023   PLT 83.0 (L) 08/17/2023   Lab Results  Component Value Date   CREATININE 1.00 08/17/2023   BUN 22 08/17/2023   NA 142 08/17/2023   K 5.1 08/17/2023   CL 109 08/17/2023  CO2 29 08/17/2023   Lab Results  Component Value Date   ALT 19 08/17/2023   AST 23 08/17/2023   ALKPHOS 60 08/17/2023   BILITOT 0.8 08/17/2023   Lab Results  Component Value Date   CHOL 132 02/20/2022   Lab Results  Component Value Date   HDL 59.80 02/20/2022   Lab Results  Component Value Date   LDLCALC 54 02/20/2022   Lab Results  Component Value Date   TRIG 89.0 02/20/2022   Lab Results  Component Value Date   CHOLHDL 2 02/20/2022   Lab Results  Component Value Date   PSA 0.019 11/20/2020   PSA 0.021 11/22/2019   PSA 0.03 (L) 12/19/2018   Lab Results   Component Value Date   HGBA1C 5.0 02/27/2016   ASSESSMENT AND PLAN:   No problem-specific Assessment & Plan notes found for this encounter.  Health maintenance exam: Reviewed age and gender appropriate health maintenance issues (prudent diet, regular exercise, health risks of tobacco and excessive alcohol, use of seatbelts, fire alarms in home, use of sunscreen).  Also reviewed age and gender appropriate health screening as well as vaccine recommendations. Vaccines:  Flu->***.  Prevnar 20-->***. Labs: HP, hba1c, psa Prostate ca screening: History of prostate cancer -->suspect he has been lost to urology follow-up since 12/2020, needs PSA. Colon ca screening: hx polyps, most recent 2019-->repeat overdue since 05/2022 Ollen).   An After Visit Summary was printed and given to the patient.  FOLLOW UP:  No follow-ups on file.  Signed:  Gerlene Hockey, MD           02/02/2024

## 2024-02-02 NOTE — Telephone Encounter (Signed)
 Please review and advise.

## 2024-02-07 ENCOUNTER — Ambulatory Visit: Admitting: Family Medicine

## 2024-02-07 ENCOUNTER — Encounter: Payer: Self-pay | Admitting: Family Medicine

## 2024-02-07 VITALS — BP 126/72 | HR 77 | Temp 97.8°F | Ht 73.75 in | Wt 224.2 lb

## 2024-02-07 DIAGNOSIS — D696 Thrombocytopenia, unspecified: Secondary | ICD-10-CM

## 2024-02-07 DIAGNOSIS — I10 Essential (primary) hypertension: Secondary | ICD-10-CM

## 2024-02-07 DIAGNOSIS — Z23 Encounter for immunization: Secondary | ICD-10-CM | POA: Diagnosis not present

## 2024-02-07 DIAGNOSIS — R7301 Impaired fasting glucose: Secondary | ICD-10-CM | POA: Diagnosis not present

## 2024-02-07 DIAGNOSIS — E7800 Pure hypercholesterolemia, unspecified: Secondary | ICD-10-CM

## 2024-02-07 DIAGNOSIS — Z125 Encounter for screening for malignant neoplasm of prostate: Secondary | ICD-10-CM

## 2024-02-07 DIAGNOSIS — Z0000 Encounter for general adult medical examination without abnormal findings: Secondary | ICD-10-CM

## 2024-02-07 LAB — COMPREHENSIVE METABOLIC PANEL WITH GFR
ALT: 20 U/L (ref 0–53)
AST: 27 U/L (ref 0–37)
Albumin: 4.4 g/dL (ref 3.5–5.2)
Alkaline Phosphatase: 60 U/L (ref 39–117)
BUN: 22 mg/dL (ref 6–23)
CO2: 28 meq/L (ref 19–32)
Calcium: 9.4 mg/dL (ref 8.4–10.5)
Chloride: 105 meq/L (ref 96–112)
Creatinine, Ser: 0.85 mg/dL (ref 0.40–1.50)
GFR: 82.46 mL/min (ref >60.00)
Glucose, Bld: 92 mg/dL (ref 70–99)
Potassium: 4.5 meq/L (ref 3.5–5.1)
Sodium: 141 meq/L (ref 135–145)
Total Bilirubin: 1.7 mg/dL — ABNORMAL HIGH (ref 0.2–1.2)
Total Protein: 6.3 g/dL (ref 6.0–8.3)

## 2024-02-07 LAB — CBC WITH DIFFERENTIAL/PLATELET
Basophils Absolute: 0 K/uL (ref 0.0–0.1)
Basophils Relative: 0.4 % (ref 0.0–3.0)
Eosinophils Absolute: 0.1 K/uL (ref 0.0–0.7)
Eosinophils Relative: 2.3 % (ref 0.0–5.0)
HCT: 46.1 % (ref 39.0–52.0)
Hemoglobin: 15.2 g/dL (ref 13.0–17.0)
Lymphocytes Relative: 19.5 % (ref 12.0–46.0)
Lymphs Abs: 0.8 K/uL (ref 0.7–4.0)
MCHC: 33 g/dL (ref 30.0–36.0)
MCV: 91.6 fl (ref 78.0–100.0)
Monocytes Absolute: 0.4 K/uL (ref 0.1–1.0)
Monocytes Relative: 10.1 % (ref 3.0–12.0)
Neutro Abs: 2.6 K/uL (ref 1.4–7.7)
Neutrophils Relative %: 67.7 % (ref 43.0–77.0)
Platelets: 74 K/uL — ABNORMAL LOW (ref 150.0–400.0)
RBC: 5.03 Mil/uL (ref 4.22–5.81)
RDW: 13.7 % (ref 11.5–15.5)
WBC: 3.9 K/uL — ABNORMAL LOW (ref 4.0–10.5)

## 2024-02-07 LAB — LIPID PANEL
Cholesterol: 120 mg/dL (ref 0–200)
HDL: 49.7 mg/dL (ref >39.00)
LDL Cholesterol: 55 mg/dL (ref 0–99)
NonHDL: 70.07
Total CHOL/HDL Ratio: 2
Triglycerides: 73 mg/dL (ref 0.0–149.0)
VLDL: 14.6 mg/dL (ref 0.0–40.0)

## 2024-02-07 LAB — PSA, MEDICARE: PSA: 0.01 ng/mL — ABNORMAL LOW (ref 0.10–4.00)

## 2024-02-07 LAB — TSH: TSH: 1.54 u[IU]/mL (ref 0.35–5.50)

## 2024-02-07 NOTE — Progress Notes (Signed)
 Office Note 02/07/2024  CC:  Chief Complaint  Patient presents with   Annual Exam   Patient is a 78 y.o. male who is here for annual health maintenance exam and follow-up hypertension and anxiety/depression. At last visit on 01/18/2024 we restarted Cymbalta  30 mg a day and did a trial of baclofen  10 mg nightly for his neck.  INTERIM HX: No change in mood or anxiety level yet since getting back on Cymbalta . Baclofen  did not help his neck.  Neurosurgery plans facet block right C2-3 and C3-4.  Also they plan on doing dry needling and they recommended physical therapy.   PMP AWARE reviewed today: most recent rx for Adderall XR was filled 01/18/2024, # 60, rx by me. No red flags.   Past Medical History:  Diagnosis Date   Adult ADHD    adderall caused elevated bp   Anxiety and depression    in past/ due to back injuries   Aortic stenosis, moderate    Benign paroxysmal positional vertigo    Branch retinal artery occlusion of right eye    COVID-19 virus infection 09/2020   Disequilibrium syndrome    Carotid dopplers and MRI brain NORMAL x 2 (including eighth nerve studies on most recent MRI 08/2016).   Erectile dysfunction    Essential hypertension    Failed back surgical syndrome    Dr. Alm Molt 04/16/20->ref to pain mgmt-> SPINAL CORD STIMULATOR helpful (2022)   Gross hematuria    passed a stone prior to the planned CT 10/2020   History of concussion    june 2015--  hit head w/ syncope---  no residual   History of memory loss    per prior PCP records./ per pt due to medication   History of syncope    june 2015 --syncope w/ fall and pt states has happened 5 more times , the last one being jan 2016,,  states had battery of test and unknown cause   Hx of adenomatous colonic polyps 05/25/2017   Recall 05/2022   Hyperlipidemia 02/2016   atorva 02/2016---lipid #s worse so changed to generic crestor .  Much improved on crestor .   IFG (impaired fasting glucose) 02/2016   HbA1c 5  %.   Insomnia    Moderate aortic stenosis    Plan repeat echo 03/2022   Nephrolithiasis    passed stone 10/2020   OAB (overactive bladder)    Osteoarthritis, multiple sites    C-spine, L spine, knees, all my joints basically   Prostate cancer [facility]) urologist-  dr ottelin/  oncologist-  dr jason   2016 Stage T1c,  Gleason 4+3,  PSA 5.26, vol 49.19cc.  No biochemical recurrence as of 11/2020.   RLS (restless legs syndrome)    Spinal stenosis, lumbar region with neurogenic claudication    s/p L3-S1 fusion revision 09/2018. Pain worse 05/2019->MRI showed ankylosis + surg fusion, L2-3 being the only mobile level of lower spine. SPINAL CORD STIMULATOR->helpful.   Thrombocytopenia    >100K.  Mild, chronic   Upper airway cough syndrome 2018   Dr. Shellia.  Cough therapy maximized at f/u 07/2016; referred to ENT by Pulm.  Allergy  testing OK. Improved as of 08/05/16 pulm f/u.   Vertebrobasilar insufficiency    per neurologist note dr vear-- this is possible causing syncope-- no tx just be careful getting up from sitting position   White coat syndrome without hypertension     Past Surgical History:  Procedure Laterality Date   Carotid Dopplers  08/01/2014  NORMAL (Dr. Vear)   CERVICAL FUSION  last one 2010   2 times   COLONOSCOPY  02/2004; 05/2017   2019: adenomatous polyp, int hem, diverticulosis, radiation proctitis.  Recall 05/2022.   EYE SURGERY Bilateral 2013   5 right eye, 3 left eye surgeries (including cataract extraction's iol w/ lens implant, other surgery's for post-op lens fungal infection and floaters)   GOLD SEED IMPLANT N/A 02/22/2015   Procedure: GOLD SEED IMPLANT;  Surgeon: Mark Ottelin, MD;  Location: [facility];  Service: Urology;  Laterality: N/A;   LEFT THIGH QUADRICEP MUSCLE BIOPSY'S  12/31/2005   LUMBAR FUSION  x2  last one 2010   4 surgeries in back per pt   MRA/MRI w/out contrast--brain  12/2014   mild, age-related chronic microvascular ischemic change  (Dr. Vear).   NECK SURGERY  12/07/2023   RADIOACTIVE SEED IMPLANT N/A 05/17/2015   Procedure: RADIOACTIVE SEED IMPLANT/BRACHYTHERAPY IMPLANT;  Surgeon: Mark Ottelin, MD;  Location: [facility];  Service: Urology;  Laterality: N/A;   SPINAL CORD STIMULATOR IMPLANT     Spinal cord stimulator implantation  06/2020   HELPS!   TRANSRECTAL ULTRASOUND N/A 11/12/2014   Procedure: TRANSRECTAL ULTRASOUND AND BIOPSY  ;  Surgeon: Mark Ottelin, MD;  Location: [facility];  Service: Urology;  Laterality: N/A;   TRANSTHORACIC ECHOCARDIOGRAM  03/29/2014   mild concentric LVH/  ef 55-60%/  grd I DD. AV poorly visualized, possibly bicupid;  mild AS, valve area 1.78cm^2, mean grandient 16mm Hg/  trivial TR.  03/21/21: EF 60-65%, mod aortic stenosis (mean 21, peak 35, area 1.14 cm2), aortic valve is tricuspid,, mild MR, nl LV syst fxn, grd I DD.    Family History  Problem Relation Age of Onset   Hypertension Mother    Alcohol abuse Father    Arthritis Father    Lung cancer Father        smoker   Diabetes Brother    Alcohol abuse Maternal Grandfather    Stroke Maternal Grandfather    Cancer Paternal Grandmother    Alcohol abuse Paternal Grandfather    Lung cancer Brother        smoker    Social History   Socioeconomic History   Marital status: Married    Spouse name: Not on file   Number of children: Not on file   Years of education: Not on file   Highest education level: Not on file  Occupational History   Occupation: UNEMPLOYED  Tobacco Use   Smoking status: Some Days    Types: Cigarettes, Cigars   Smokeless tobacco: Former    Types: Snuff    Quit date: 04/12/2018   Tobacco comments:    using snuff x 25 years  Vaping Use   Vaping status: Never Used  Substance and Sexual Activity   Alcohol use: Yes    Alcohol/week: 0.0 standard drinks of alcohol    Comment: rare   Drug use: No   Sexual activity: Not Currently  Other Topics Concern   Not on file  Social  History Narrative   Married, 1 daughter.   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   No tob.   Occ alcohol.   Social Drivers of Health   Financial Resource Strain: Low Risk  (04/28/2023)   Overall Financial Resource Strain (CARDIA)    Difficulty of Paying Living Expenses: Not hard at all  Food Insecurity: No Food Insecurity (04/28/2023)  Hunger Vital Sign    Worried About Running Out of Food in the Last Year: Never true    Ran Out of Food in the Last Year: Never true  Transportation Needs: No Transportation Needs (04/28/2023)   PRAPARE - Administrator, Civil Service (Medical): No    Lack of Transportation (Non-Medical): No  Physical Activity: Inactive (04/28/2023)   Exercise Vital Sign    Days of Exercise per Week: 0 days    Minutes of Exercise per Session: 0 min  Stress: Stress Concern Present (04/28/2023)   [employer] of Occupational Health - Occupational Stress Questionnaire    Feeling of Stress : To some extent  Social Connections: Moderately Isolated (04/28/2023)   Social Connection and Isolation Panel    Frequency of Communication with Friends and Family: More than three times a week    Frequency of Social Gatherings with Friends and Family: Never    Attends Religious Services: Never    Database Administrator or Organizations: No    Attends Banker Meetings: Never    Marital Status: Married  Catering Manager Violence: Not At Risk (04/28/2023)   Humiliation, Afraid, Rape, and Kick questionnaire    Fear of Current or Ex-Partner: No    Emotionally Abused: No    Physically Abused: No    Sexually Abused: No    Outpatient Medications Prior to Visit  Medication Sig Dispense Refill   Albuterol -Budesonide (AIRSUPRA ) 90-80 MCG/ACT AERO Inhale 2 puffs into the lungs 2 (two) times daily. Lot # [id number]     amphetamine -dextroamphetamine  (ADDERALL XR) 30 MG 24 hr capsule 2 tabs po qAM 60 capsule 0   b complex  vitamins capsule Take 1 capsule by mouth daily.     baclofen  (LIORESAL ) 10 MG tablet 1 tab po at bedtime for neck pain 30 each 0   DULoxetine  (CYMBALTA ) 30 MG capsule Take 1 capsule (30 mg total) by mouth daily. 90 capsule 3   Magnesium 500 MG TABS Take 500 mg by mouth daily.     Melatonin 3 MG CAPS Take by mouth.     Multiple Vitamin (MULTIVITAMIN) tablet Take 1 tablet by mouth daily.     Omega-3 Fatty Acids (FISH OIL) 300 MG CAPS Take by mouth.     rOPINIRole  (REQUIP ) 1 MG tablet TAKE 5 TABLETS BY MOUTH AT BEDTIME 450 tablet 3   TART CHERRY PO Take by mouth daily.     traZODone  (DESYREL ) 50 MG tablet TAKE 1 TO 2 TABLETS BY MOUTH EVERY DAY AT BEDTIME AS NEEDED FOR INSOMNIA 180 tablet 1   valsartan  (DIOVAN ) 80 MG tablet 1 tab po qd 90 tablet 3   VITAMIN D PO Take 50,000 Units by mouth 2 (two) times daily.     No facility-administered medications prior to visit.    Allergies  Allergen Reactions   Methylphenidate  Palpitations    Pt reported; confirmed he wanted med added to his allergy  list   Other     Narcotics;   Nightmares, hallucinations   Oxycodone  Itching and Other (See Comments)    Nightmares, hallucinations   Penicillins Other (See Comments)    Unknown childhood reaction    Review of Systems  Constitutional:  Negative for appetite change, chills, fatigue and fever.  HENT:  Negative for congestion, dental problem, ear pain and sore throat.   Eyes:  Negative for discharge, redness and visual disturbance.  Respiratory:  Negative for cough, chest tightness, shortness of breath and wheezing.   Cardiovascular:  Negative for chest pain, palpitations and leg swelling.  Gastrointestinal:  Negative for abdominal pain, blood in stool, diarrhea, nausea and vomiting.  Genitourinary:  Negative for difficulty urinating, dysuria, flank pain, frequency, hematuria and urgency.  Musculoskeletal:  Negative for arthralgias, back pain, joint swelling, myalgias and neck stiffness.  Skin:   Negative for pallor and rash.  Neurological:  Negative for dizziness, speech difficulty, weakness and headaches.  Hematological:  Negative for adenopathy. Does not bruise/bleed easily.  Psychiatric/Behavioral:  Negative for confusion and sleep disturbance. The patient is not nervous/anxious.     PE;    02/07/2024    1:29 PM 01/18/2024   11:11 AM 09/14/2023   10:51 AM  Vitals with BMI  Height 6' 1.75 6' 3 6' 3  Weight 224 lbs 3 oz 227 lbs 231 lbs 3 oz  BMI 28.99 28.37 28.9  Systolic 126 97 105  Diastolic 72 61 62  Pulse 77 60 63   Gen: Alert, well appearing.  Patient is oriented to person, place, time, and situation. AFFECT: pleasant, lucid thought and speech. ENT: Ears: EACs clear, normal epithelium.  TMs with good light reflex and landmarks bilaterally.  Eyes: no injection, icteris, swelling, or exudate.  EOMI, PERRLA. Nose: no drainage or turbinate edema/swelling.  No injection or focal lesion.  Mouth: lips without lesion/swelling.  Oral mucosa pink and moist.  Dentition intact and without obvious caries or gingival swelling.  Oropharynx without erythema, exudate, or swelling.  Neck: supple/nontender.  No LAD, mass, or TM.  Carotid pulses 2+ bilaterally, without bruits. CV: RRR, no m/r/g.   LUNGS: CTA bilat, nonlabored resps, good aeration in all lung fields. ABD: soft, NT, ND, BS normal.  No hepatospenomegaly or mass.  No bruits. EXT: no clubbing, cyanosis, or edema.  Musculoskeletal: no joint swelling, erythema, warmth, or tenderness.  ROM of all joints intact. Skin - no sores or suspicious lesions or rashes or color changes  Pertinent labs:  Lab Results  Component Value Date   TSH 2.00 02/27/2016   Lab Results  Component Value Date   WBC 4.1 08/17/2023   HGB 14.0 08/17/2023   HCT 42.8 08/17/2023   MCV 92.6 08/17/2023   PLT 83.0 (L) 08/17/2023   Lab Results  Component Value Date   CREATININE 1.00 08/17/2023   BUN 22 08/17/2023   NA 142 08/17/2023   K 5.1  08/17/2023   CL 109 08/17/2023   CO2 29 08/17/2023   Lab Results  Component Value Date   ALT 19 08/17/2023   AST 23 08/17/2023   ALKPHOS 60 08/17/2023   BILITOT 0.8 08/17/2023   Lab Results  Component Value Date   CHOL 132 02/20/2022   Lab Results  Component Value Date   HDL 59.80 02/20/2022   Lab Results  Component Value Date   LDLCALC 54 02/20/2022   Lab Results  Component Value Date   TRIG 89.0 02/20/2022   Lab Results  Component Value Date   CHOLHDL 2 02/20/2022   Lab Results  Component Value Date   PSA 0.019 11/20/2020   PSA 0.021 11/22/2019   PSA 0.03 (L) 12/19/2018   Lab Results  Component Value Date   HGBA1C 5.0 02/27/2016   ASSESSMENT AND PLAN:   #1 health maintenance exam: Reviewed age and gender appropriate health maintenance issues (prudent diet, regular exercise, health risks of tobacco and excessive alcohol, use of seatbelts, fire alarms in home, use of sunscreen).  Also reviewed age and gender appropriate health screening as well as  vaccine recommendations. Vaccines:  Flu->given today. Labs: HP, hba1c, psa Prostate ca screening: History of prostate cancer -->suspect he has been lost to urology follow-up since 12/2020, needs PSA. Colon ca screening: hx polyps, most recent 2019-->repeat overdue since 05/2022 [facility]).  #2 hypertension, well-controlled on valsartan  80 mg a day. Electrolytes and creatinine monitoring today.  3.  Recurrent depression, PTSD. He has restarted Cymbalta  30 mg a day recently.    #4 moderate aortic stenosis:  f/u Echo ordered 08/27/23-->needs to be scheduled.  5.  Chronic cervicalgia. His neurosurgeon Dr. Joshua has recommended right sided C2-3 and C3-4 facet blocks, dry needling, and PT. Patient will call our office early next month to request PT order.  #6 prostate cancer, diagnosed 2016.  Radioactive seed implant procedure 2017.  He has been lost to urology follow-up for a few years. Check PSA today.  An After  Visit Summary was printed and given to the patient.  FOLLOW UP:  Return in about 6 months (around 08/06/2024) for routine chronic illness f/u.  Signed:  Gerlene Hockey, MD           02/07/2024

## 2024-02-07 NOTE — Patient Instructions (Addendum)
   It was very nice to see you today!   Please contact [facility] to schedule colonoscopy [phone].  You can contact [facility] to schedule the echocardiogram.    [facility] [address] Radiology [place] [address] Phone: [phone] Hours: Mon-Fri 7am-5:30pm, Sat & Sun 7:30-5pm  Mammograms only done Mon-Thurs 8am-5:30pm     PLEASE NOTE:  If labs were collected or images ordered, we will inform you of  results once we have received them and reviewed. We will contact you either by echart message, or telephone call.  Please give ample time to the testing facility, and our office to run,  receive and review results. Please do not call inquiring of results, even if you can see them in your chart. We will contact you as soon as we are able. If it has been over 1 week since the test was completed, and you have not yet heard from us , then please call us .     - echart message- for normal results that have been seen by the patient already.   - telephone call: abnormal results or if patient has not viewed results in their echart.   If a referral to a specialist was entered for you, please call us  in 2 weeks if you have not heard from the specialist office to schedule.    Please try these tips to maintain a healthy lifestyle:  Eat most of your calories during the day when you are active. Eliminate processed foods including packaged sweets (pies, cakes, cookies), reduce intake of potatoes, white bread, white pasta, and white rice. Look for whole grain options, oat flour or almond flour.  Each meal should contain half fruits/vegetables, one quarter protein, and one quarter carbs (no bigger than a computer mouse).  Cut down on sweet beverages. This includes juice, soda, and sweet tea. Also watch fruit intake, though this is a healthier sweet option, it still contains natural sugar! Limit to 3 servings daily.  Drink at least 1 glass of water  with  each meal and aim for at least 8 glasses per day  Exercise at least 150 minutes every week.

## 2024-02-09 ENCOUNTER — Other Ambulatory Visit: Payer: Self-pay | Admitting: Family Medicine

## 2024-02-12 ENCOUNTER — Ambulatory Visit: Payer: Self-pay | Admitting: Family Medicine

## 2024-02-15 DIAGNOSIS — M542 Cervicalgia: Secondary | ICD-10-CM | POA: Diagnosis not present

## 2024-02-21 DIAGNOSIS — M542 Cervicalgia: Secondary | ICD-10-CM | POA: Diagnosis not present

## 2024-02-22 ENCOUNTER — Other Ambulatory Visit: Payer: Self-pay | Admitting: Family Medicine

## 2024-02-22 NOTE — Telephone Encounter (Unsigned)
 Copied from CRM #[id number]. Topic: Clinical - Medication Refill >> Feb 22, 2024  3:13 PM Shereese L wrote: Medication: amphetamine -dextroamphetamine  (ADDERALL XR) 30 MG 24 hr capsule  Has the patient contacted their pharmacy? Yes (Agent: If no, request that the patient contact the pharmacy for the refill. If patient does not wish to contact the pharmacy document the reason why and proceed with request.) (Agent: If yes, when and what did the pharmacy advise?)  This is the patient's preferred pharmacy:  [id number] - [place], [place] - [address] [place] [address] [place] [address] [place] [address] Phone: [phone] Fax: [phone]  Is this the correct pharmacy for this prescription? Yes If no, delete pharmacy and type the correct one.   Has the prescription been filled recently? Yes  Is the patient out of the medication? Yes  Has the patient been seen for an appointment in the last year OR does the patient have an upcoming appointment? Yes  Can we respond through MyChart? Yes  Agent: Please be advised that Rx refills may take up to 3 business days. We ask that you follow-up with your pharmacy.

## 2024-02-22 NOTE — Telephone Encounter (Signed)
 Requesting:  Adderall  Contract: 02/20/22 UDS: 12/14/22 Last Visit: 02/07/24 Next Visit: 08/08/24 Last Refill: 01/18/24 (60,0)  Please Advise. Rx pending

## 2024-02-23 MED ORDER — AMPHETAMINE-DEXTROAMPHET ER 30 MG PO CP24: ORAL_CAPSULE | ORAL | 0 refills | Status: DC

## 2024-03-09 ENCOUNTER — Other Ambulatory Visit: Payer: Self-pay | Admitting: Family Medicine

## 2024-04-13 ENCOUNTER — Other Ambulatory Visit: Payer: Self-pay

## 2024-04-13 ENCOUNTER — Ambulatory Visit: Payer: Self-pay

## 2024-04-13 MED ORDER — TRAZODONE HCL 50 MG PO TABS: ORAL_TABLET | ORAL | 1 refills | Status: AC

## 2024-04-13 MED ORDER — AMPHETAMINE-DEXTROAMPHET ER 30 MG PO CP24: ORAL_CAPSULE | ORAL | 0 refills | Status: AC

## 2024-04-13 NOTE — Telephone Encounter (Signed)
 Refills requested for Adderall and [facility] [place] Last OV 11/24 Next ov 2/2

## 2024-04-13 NOTE — Telephone Encounter (Signed)
 No further action needed.

## 2024-04-13 NOTE — Telephone Encounter (Signed)
 FYI Only or Action Required?: Action required by provider: request for appointment.  Patient was last seen in primary care on 02/07/2024 by McGowen, Aleene DEL, MD.  Called Nurse Triage reporting Neck Pain.  Symptoms began several months ago.  Interventions attempted: Rest, hydration, or home remedies.  Symptoms are: gradually worsening.Pt. Reports has had back and neck issues for years. Neck is getting worse. Has stabbing pain when he tries to move at all. Wants to discuss treatment.  Triage Disposition: See PCP When Office is Open (Within 3 Days)  Patient/caregiver understands and will follow disposition?: Yes       Reason for Triage: Pt has extreme pain in neck and can't turn his head. He stated that he is needing some type of relief. He has had 10 surgeries on back and the last surgery was on his neck. *discuss test for heart regarding buildup, referral, and has personal questions.   Reason for Disposition  [1] MODERATE neck pain (e.g., interferes with normal activities) AND [2] present > 3 days  Answer Assessment - Initial Assessment Questions 1. ONSET: When did the pain begin?      Years, getting worse 2. LOCATION: Where does it hurt?      neck 3. PATTERN Does the pain come and go, or has it been constant since it started?      constant 4. SEVERITY: How bad is the pain?  (Scale 0-10; or none or slight stiffness, mild, moderate, severe)     severe 5. RADIATION: Does the pain go anywhere else, shoot into your arms?     no 6. CORD SYMPTOMS: Any weakness or numbness of the arms or legs?     no 7. CAUSE: What do you think is causing the neck pain?     N/a 8. NECK OVERUSE: Any recent activities that involved turning or twisting the neck?     no 9. OTHER SYMPTOMS: Do you have any other symptoms? (e.g., headache, fever, chest pain, difficulty breathing, neck swelling)     no 10. PREGNANCY: Is there any chance you are pregnant? When was your last  menstrual period?       N/a  Protocols used: Neck Pain or Stiffness-A-AH

## 2024-04-17 ENCOUNTER — Ambulatory Visit: Admitting: Family Medicine

## 2024-08-08 ENCOUNTER — Ambulatory Visit: Admitting: Family Medicine
